# Patient Record
Sex: Male | Born: 1945 | Race: White | Hispanic: No | Marital: Single | Smoking: Current every day smoker
Health system: Southern US, Community
[De-identification: ages and names within clinical notes are randomized; demographics above are authoritative.]

## PROBLEM LIST (undated history)

## (undated) DIAGNOSIS — K222 Esophageal obstruction: Secondary | ICD-10-CM

## (undated) DIAGNOSIS — I639 Cerebral infarction, unspecified: Secondary | ICD-10-CM

## (undated) DIAGNOSIS — F4310 Post-traumatic stress disorder, unspecified: Secondary | ICD-10-CM

## (undated) DIAGNOSIS — I10 Essential (primary) hypertension: Secondary | ICD-10-CM

## (undated) HISTORY — PX: ORIF FEMUR FRACTURE- LISS PLATE: SHX2120

## (undated) HISTORY — PX: MELANOMA EXCISION: SHX5266

---

## 2010-10-12 ENCOUNTER — Inpatient Hospital Stay (HOSPITAL_COMMUNITY)
Admission: EM | Admit: 2010-10-12 | Discharge: 2010-10-17 | Payer: Self-pay | Source: Home / Self Care | Attending: Orthopaedic Surgery | Admitting: Orthopaedic Surgery

## 2010-10-31 ENCOUNTER — Inpatient Hospital Stay (HOSPITAL_COMMUNITY)
Admission: AD | Admit: 2010-10-31 | Discharge: 2010-11-02 | Payer: Self-pay | Source: Home / Self Care | Attending: Orthopaedic Surgery | Admitting: Orthopaedic Surgery

## 2010-11-05 LAB — BASIC METABOLIC PANEL
BUN: 14 mg/dL (ref 6–23)
BUN: 9 mg/dL (ref 6–23)
CO2: 29 mEq/L (ref 19–32)
CO2: 28 mEq/L (ref 19–32)
Calcium: 8.7 mg/dL (ref 8.4–10.5)
Calcium: 8.2 mg/dL — ABNORMAL LOW (ref 8.4–10.5)
Chloride: 83 mEq/L — ABNORMAL LOW (ref 96–112)
Chloride: 87 mEq/L — ABNORMAL LOW (ref 96–112)
Creatinine, Ser: 0.66 mg/dL (ref 0.4–1.5)
Creatinine, Ser: 0.52 mg/dL (ref 0.4–1.5)
GFR calc Af Amer: >60 mL/min (ref >60)
GFR calc Af Amer: >60 mL/min (ref >60)
GFR calc non Af Amer: >60 mL/min (ref >60)
GFR calc non Af Amer: >60 mL/min (ref >60)
Glucose, Bld: 113 mg/dL — ABNORMAL HIGH (ref 70–99)
Glucose, Bld: 125 mg/dL — ABNORMAL HIGH (ref 70–99)
Potassium: 3.5 mEq/L (ref 3.5–5.1)
Potassium: 3.6 mEq/L (ref 3.5–5.1)
Sodium: 122 mEq/L — ABNORMAL LOW (ref 135–145)
Sodium: 124 mEq/L — ABNORMAL LOW (ref 135–145)

## 2010-11-05 LAB — CBC
HCT: 34.2 % — ABNORMAL LOW (ref 39.0–52.0)
HCT: 31.8 % — ABNORMAL LOW (ref 39.0–52.0)
Hemoglobin: 12.5 g/dL — ABNORMAL LOW (ref 13.0–17.0)
Hemoglobin: 11.3 g/dL — ABNORMAL LOW (ref 13.0–17.0)
MCH: 31.4 pg (ref 26.0–34.0)
MCH: 30.5 pg (ref 26.0–34.0)
MCHC: 36.5 g/dL — ABNORMAL HIGH (ref 30.0–36.0)
MCHC: 35.5 g/dL (ref 30.0–36.0)
MCV: 85.9 fL (ref 78.0–100.0)
MCV: 85.7 fL (ref 78.0–100.0)
Platelets: 308 10*3/uL (ref 150–400)
Platelets: 283 10*3/uL (ref 150–400)
RBC: 3.98 MIL/uL — ABNORMAL LOW (ref 4.22–5.81)
RBC: 3.71 MIL/uL — ABNORMAL LOW (ref 4.22–5.81)
RDW: 11.8 % (ref 11.5–15.5)
RDW: 11.8 % (ref 11.5–15.5)
WBC: 14.3 10*3/uL — ABNORMAL HIGH (ref 4.0–10.5)
WBC: 14.3 10*3/uL — ABNORMAL HIGH (ref 4.0–10.5)

## 2010-11-05 LAB — SURGICAL PCR SCREEN
MRSA, PCR: NEGATIVE
Staphylococcus aureus: NEGATIVE

## 2010-11-05 LAB — PROTIME-INR
INR: 1.01 (ref 0.00–1.49)
Prothrombin Time: 13.5 seconds (ref 11.6–15.2)

## 2010-12-31 LAB — DIFFERENTIAL
Basophils Absolute: 0 10*3/uL (ref 0.0–0.1)
Basophils Relative: 0 % (ref 0–1)
Eosinophils Absolute: 0 10*3/uL (ref 0.0–0.7)
Eosinophils Relative: 0 % (ref 0–5)
Lymphocytes Relative: 5 % — ABNORMAL LOW (ref 12–46)
Lymphs Abs: 0.7 10*3/uL (ref 0.7–4.0)
Monocytes Absolute: 0.8 10*3/uL (ref 0.1–1.0)
Monocytes Relative: 5 % (ref 3–12)
Neutro Abs: 13.1 10*3/uL — ABNORMAL HIGH (ref 1.7–7.7)
Neutrophils Relative %: 90 % — ABNORMAL HIGH (ref 43–77)

## 2010-12-31 LAB — CBC
HCT: 39.1 % (ref 39.0–52.0)
Hemoglobin: 13.6 g/dL (ref 13.0–17.0)
MCH: 30.9 pg (ref 26.0–34.0)
MCHC: 34.8 g/dL (ref 30.0–36.0)
MCV: 88.9 fL (ref 78.0–100.0)
Platelets: 158 10*3/uL (ref 150–400)
RBC: 4.4 MIL/uL (ref 4.22–5.81)
RDW: 12.8 % (ref 11.5–15.5)
WBC: 14.6 10*3/uL — ABNORMAL HIGH (ref 4.0–10.5)

## 2010-12-31 LAB — URINALYSIS, ROUTINE W REFLEX MICROSCOPIC
Bilirubin Urine: NEGATIVE
Glucose, UA: NEGATIVE mg/dL
Hgb urine dipstick: NEGATIVE
Ketones, ur: NEGATIVE mg/dL
Nitrite: NEGATIVE
Protein, ur: NEGATIVE mg/dL
Specific Gravity, Urine: 1.014 (ref 1.005–1.030)
Urobilinogen, UA: 1 mg/dL (ref 0.0–1.0)
pH: 8 (ref 5.0–8.0)

## 2010-12-31 LAB — URINE CULTURE
Colony Count: 40000
Culture  Setup Time: 201112232022

## 2010-12-31 LAB — BASIC METABOLIC PANEL
BUN: 12 mg/dL (ref 6–23)
CO2: 25 mEq/L (ref 19–32)
Calcium: 8.9 mg/dL (ref 8.4–10.5)
Chloride: 98 mEq/L (ref 96–112)
Creatinine, Ser: 0.76 mg/dL (ref 0.4–1.5)
GFR calc Af Amer: >60 mL/min (ref >60)
GFR calc non Af Amer: >60 mL/min (ref >60)
Glucose, Bld: 117 mg/dL — ABNORMAL HIGH (ref 70–99)
Potassium: 3.8 mEq/L (ref 3.5–5.1)
Sodium: 133 mEq/L — ABNORMAL LOW (ref 135–145)

## 2011-11-19 IMAGING — CR DG FEMUR 2V*L*
5 series · 5 of 5 positions shown · non-contrast
Comparison: None.

CLINICAL DATA: Left femur pain secondary to a fall today.

LEFT FEMUR - 2 VIEW

[t femur with hip  ap left]
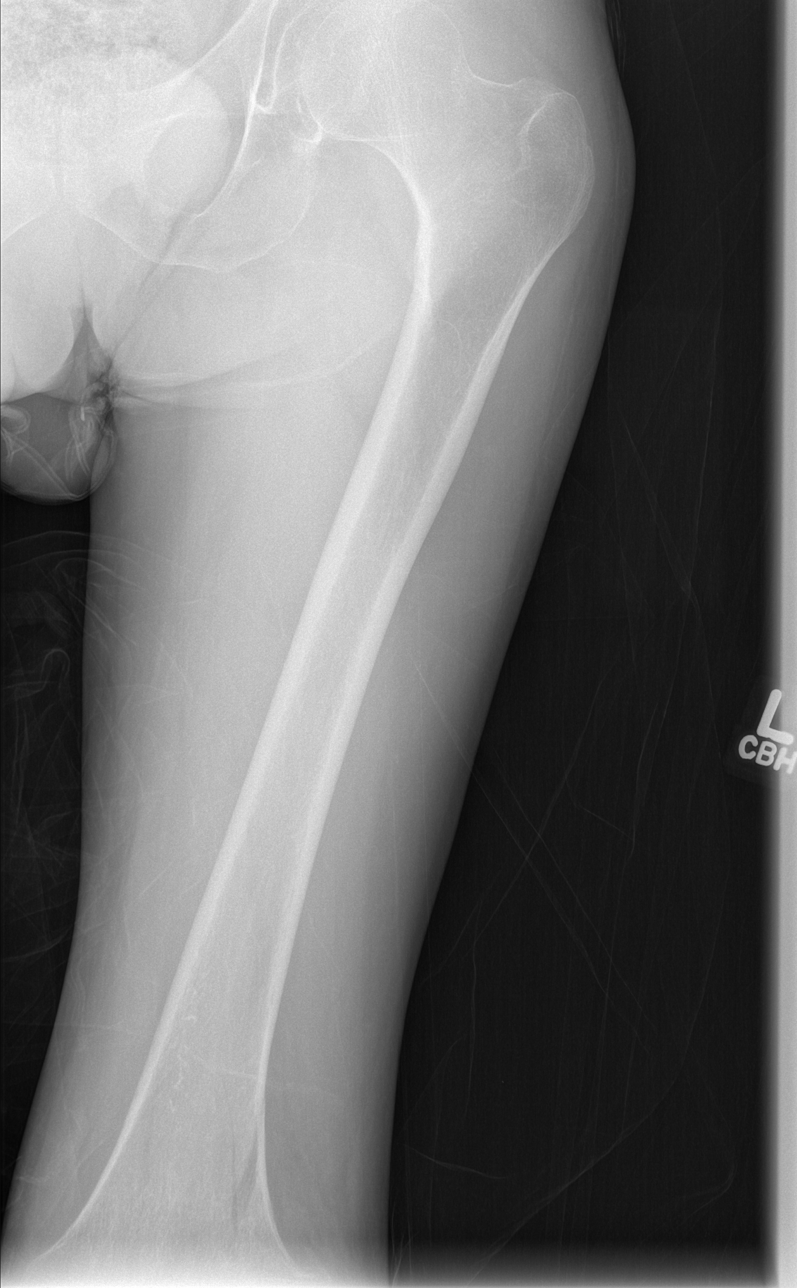

[t femur with knee ap left (1 of 2)]
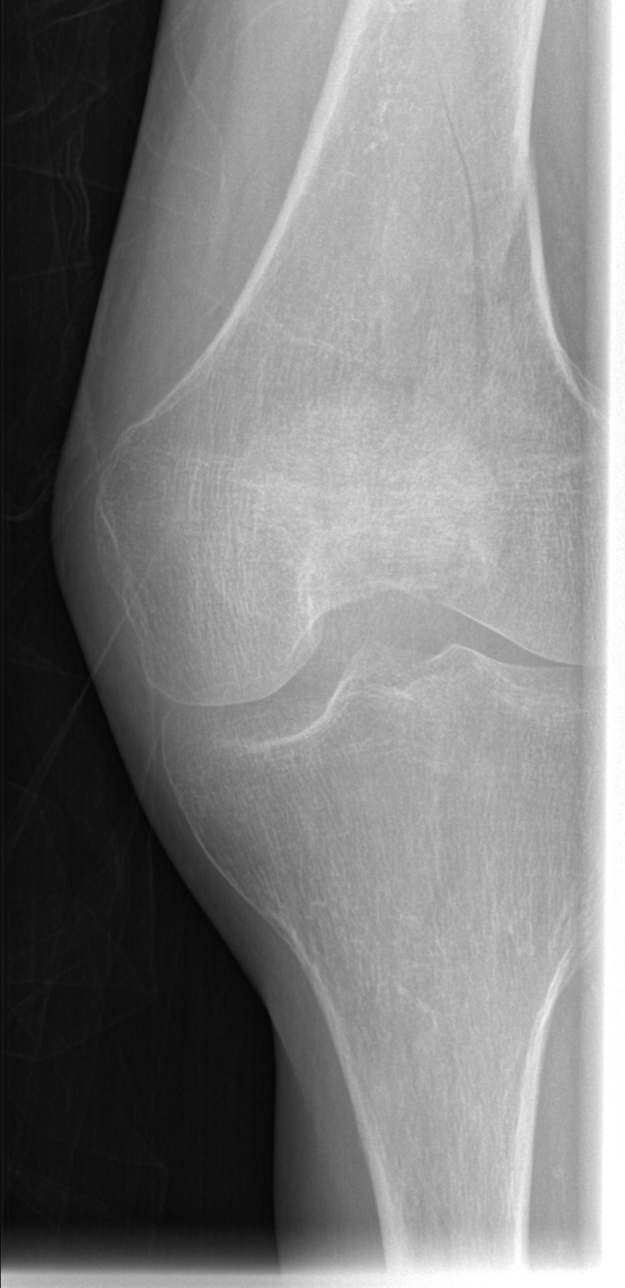

[t femur with knee ap left (2 of 2)]
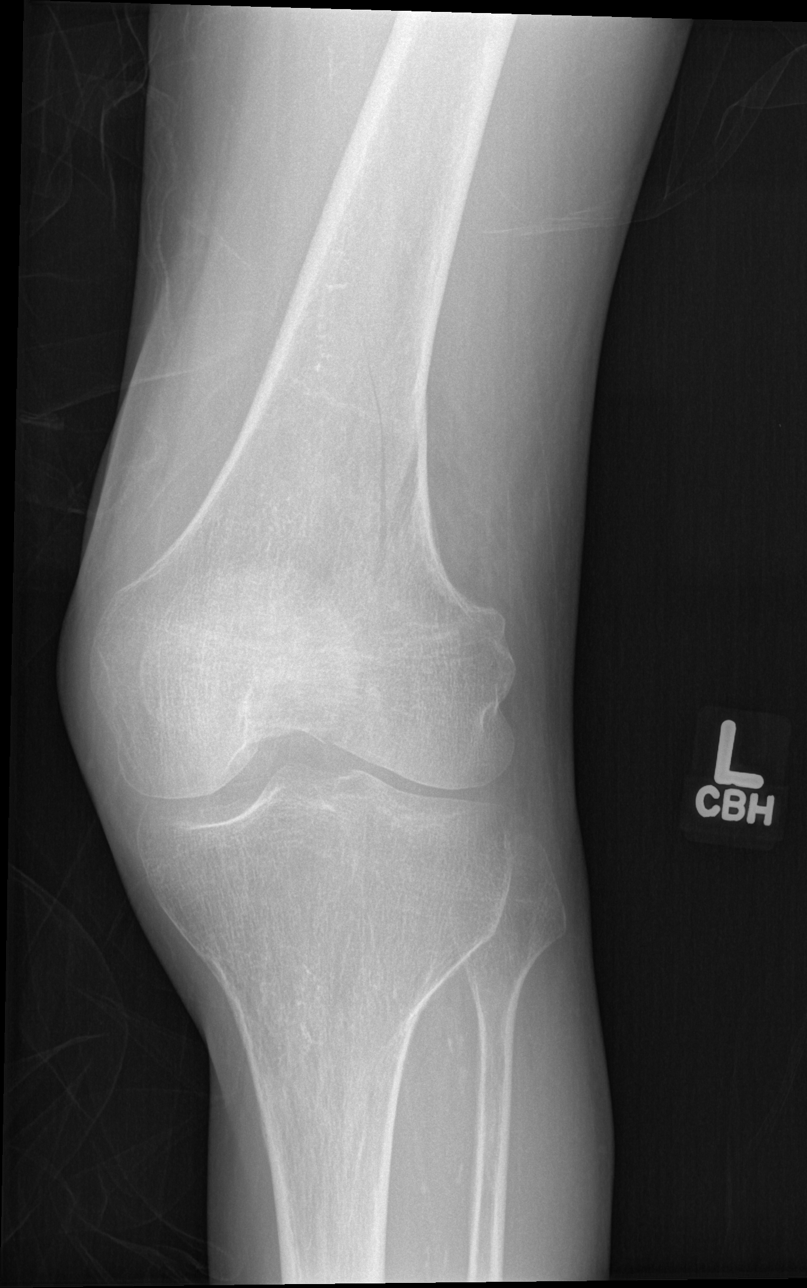

[t femur with hip lat left]
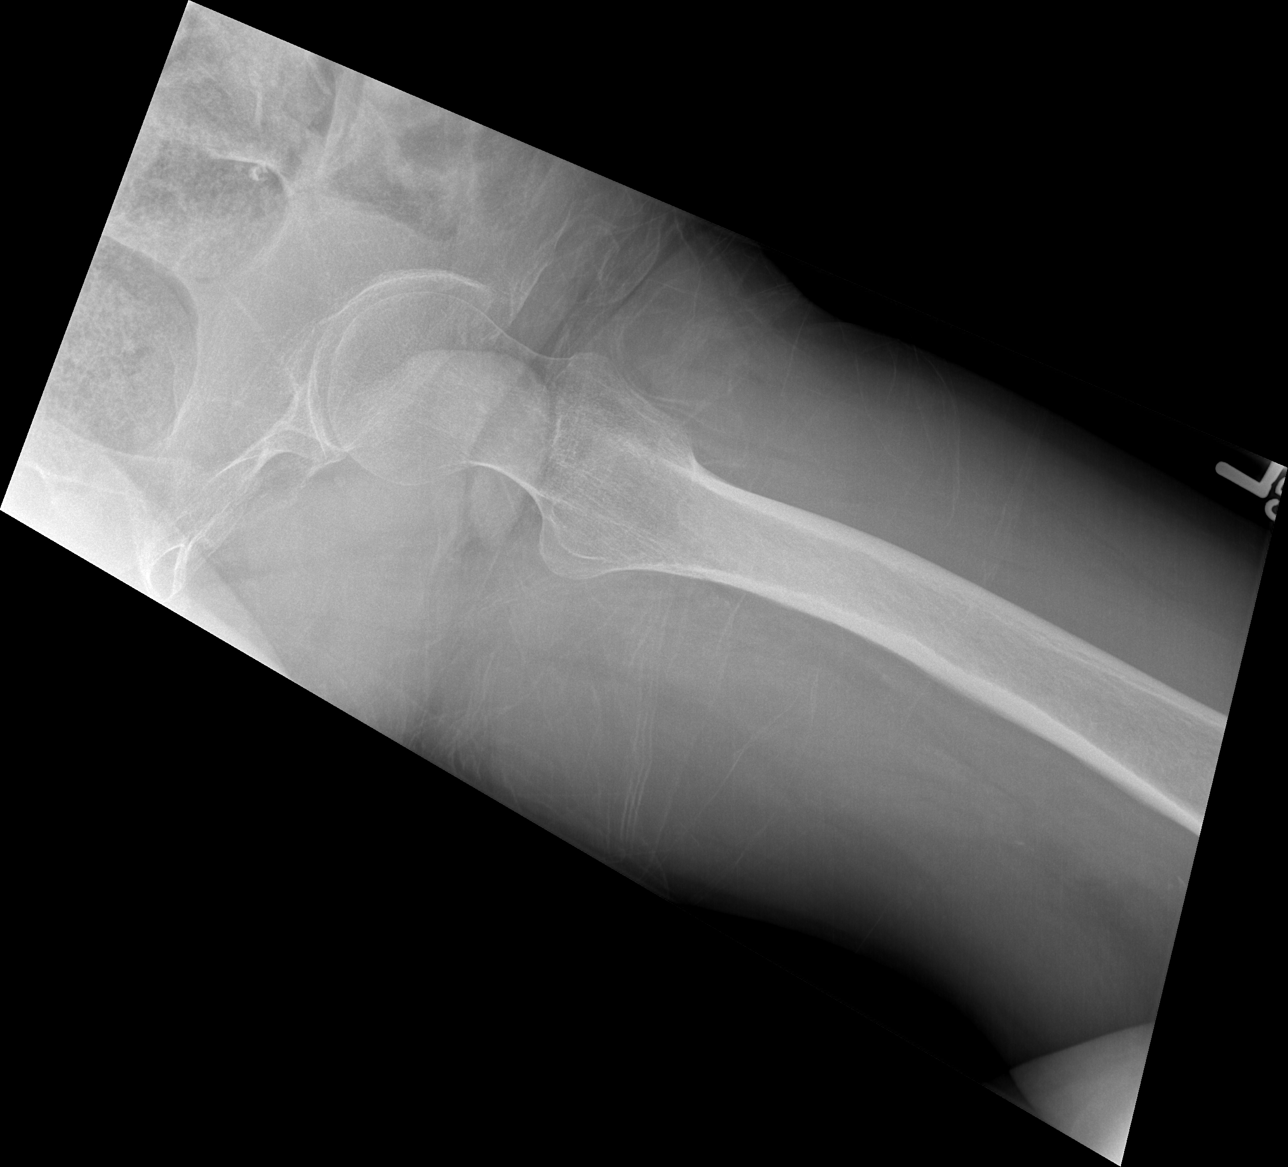

[t femur with knee lat left]
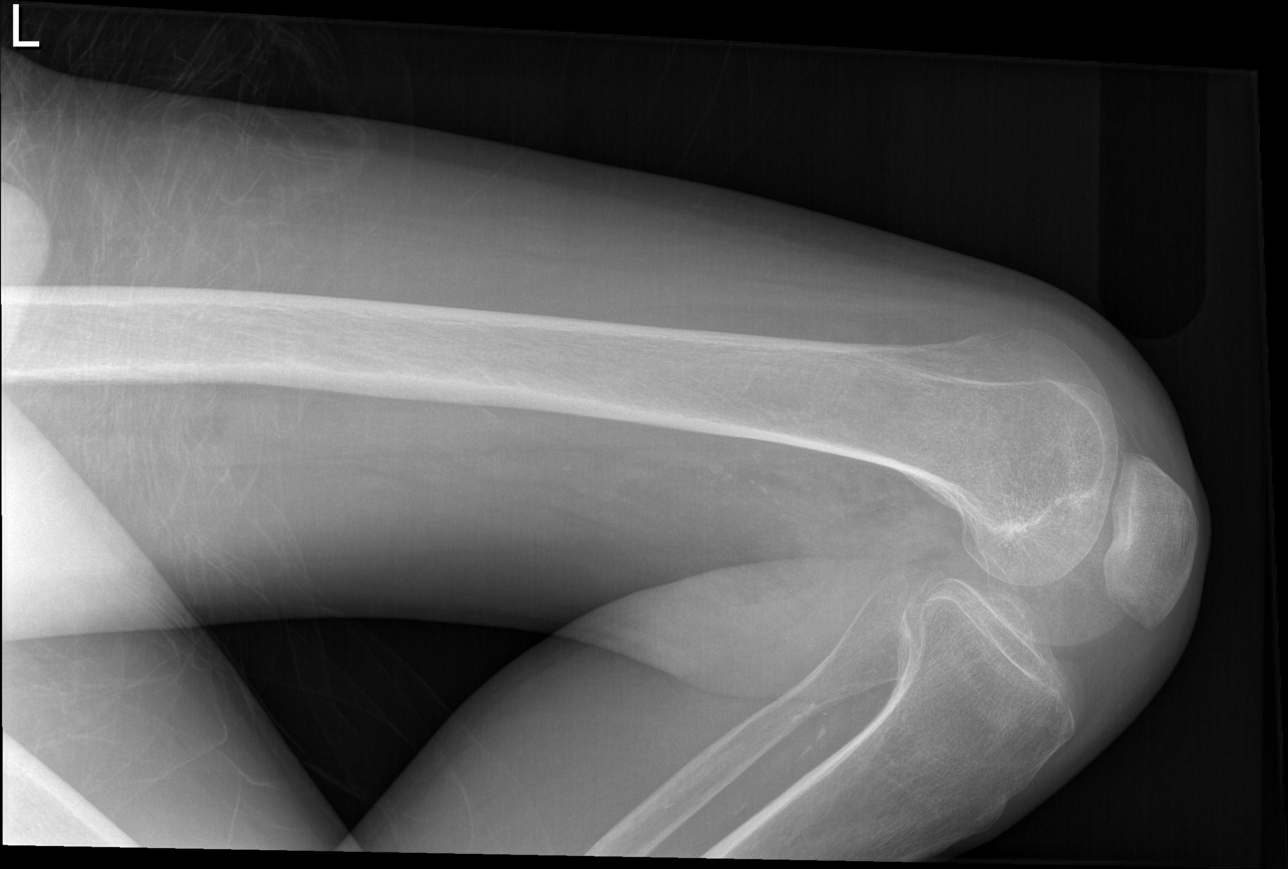

[5 of 5 positions shown; findings below may reference images not displayed]

FINDINGS: There is a nondisplaced spiral fracture of the distal
shaft of the left femur.  The fracture does extend into the
intercondylar notch.

There is severe osteopenia.  There are no significant degenerative
changes of the hip joint.
IMPRESSION: Spiral fracture of the distal left femoral shaft extending into the
intercondylar notch.  Osteopenia.

## 2011-12-08 IMAGING — RF DG FEMUR 2V*L*
1 series · 4 of 4 positions shown · non-contrast
Comparison: 10/12/2010.

CLINICAL DATA: 64-year-old male undergoing ORIF.

LEFT FEMUR - 2 VIEW

[Series 1: run · 4 of 4 slices shown]
[im 1/4]
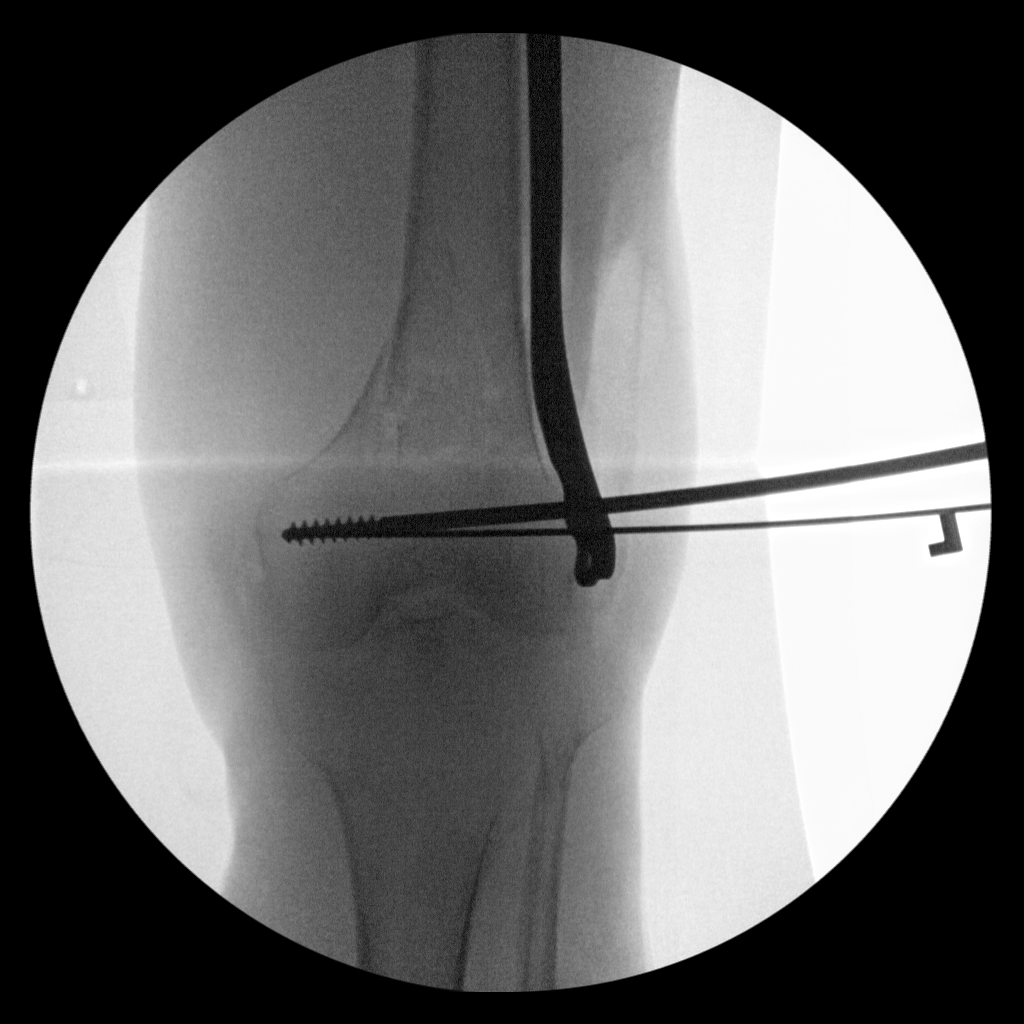
[im 2/4]
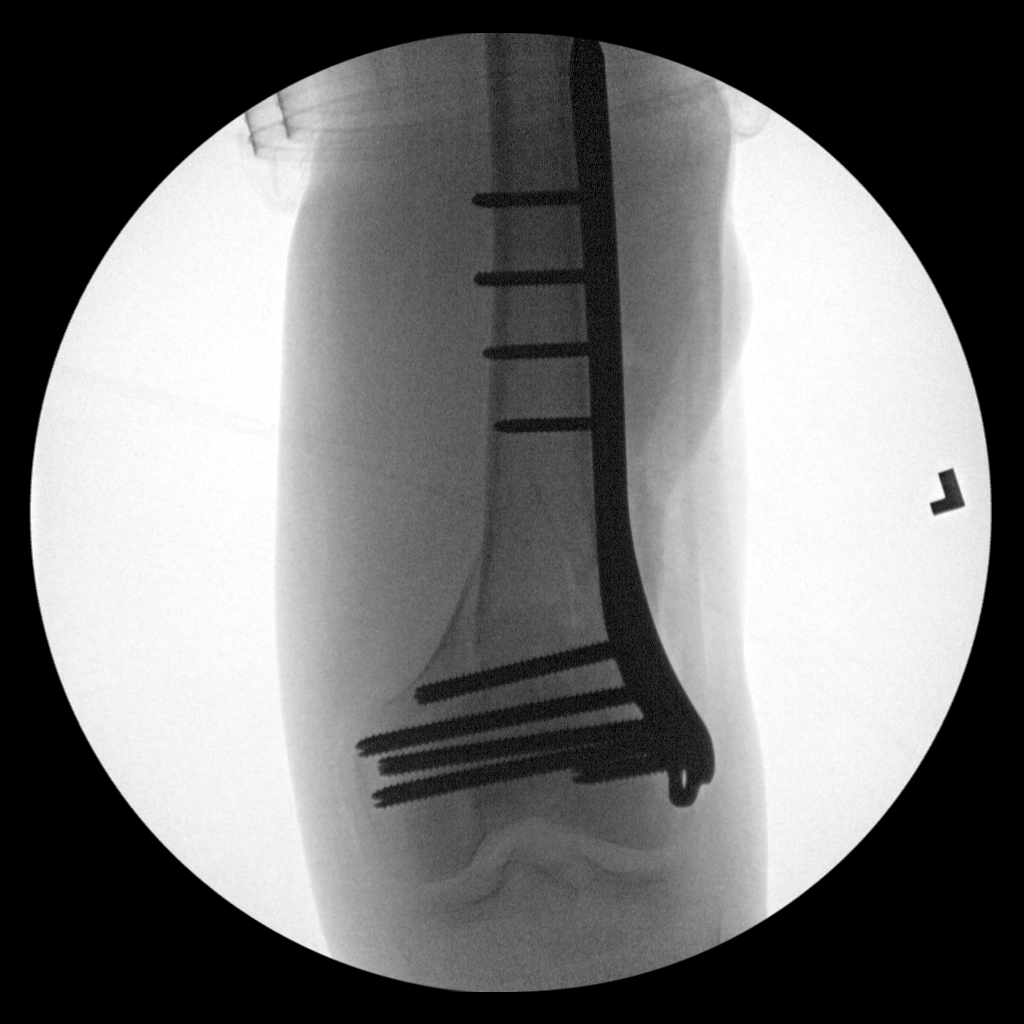
[im 3/4]
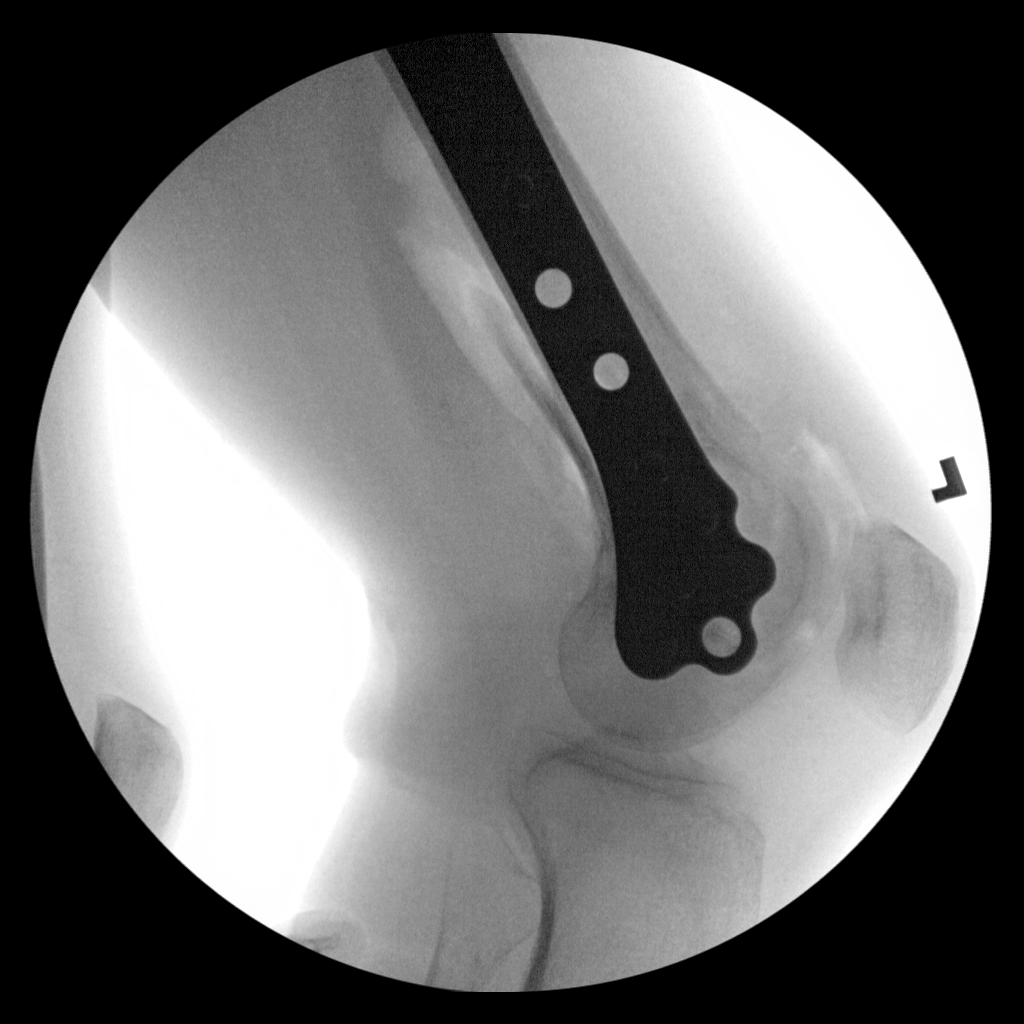
[im 4/4]
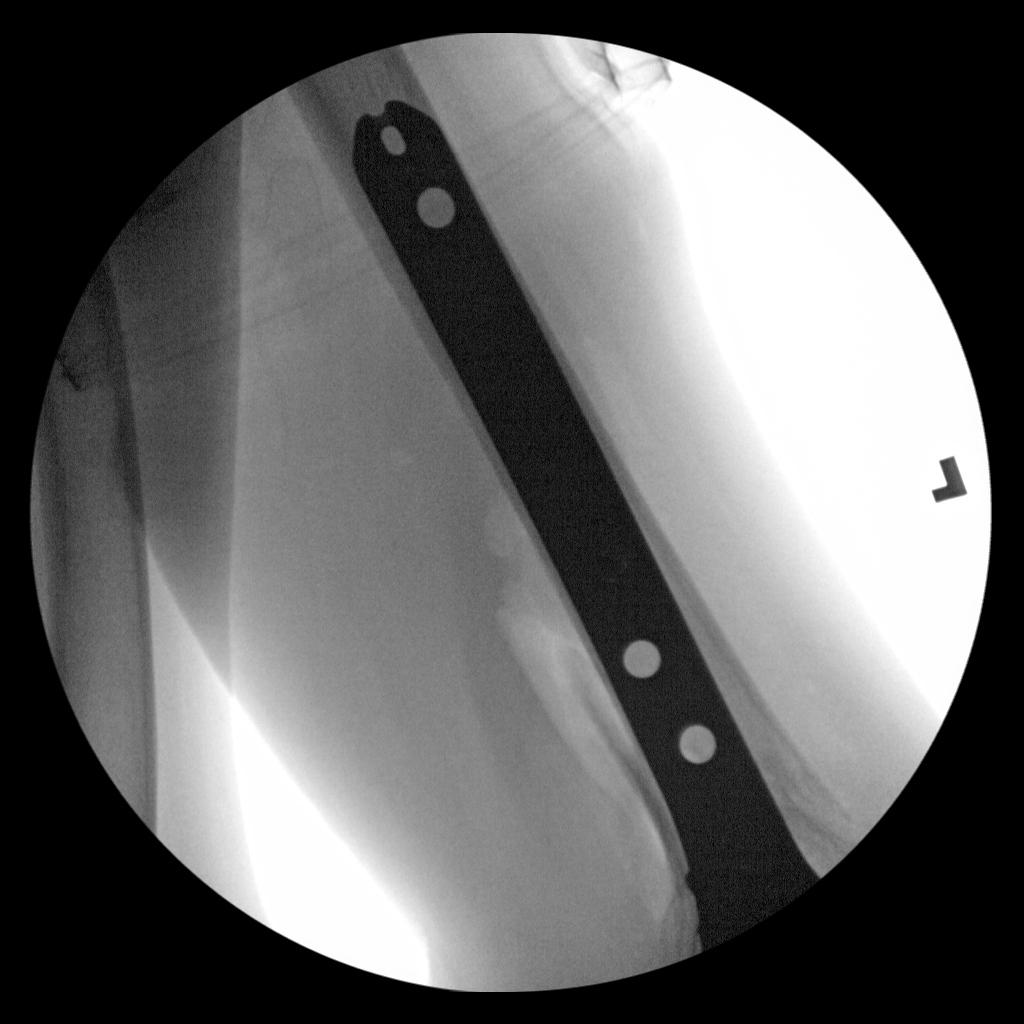

[4 of 4 positions shown; findings below may reference images not displayed]

FINDINGS: 4 intraoperative fluoroscopic spot views of the distal
left femur.  Lateral plate and cortical screw fixation of the
distal femur demonstrated traversing the spiral fracture previously
demonstrated.  Hardware  appears intact.
IMPRESSION: No adverse hardware features status post ORIF distal left femur
spiral fracture.

## 2012-02-16 ENCOUNTER — Encounter (HOSPITAL_COMMUNITY): Payer: Self-pay

## 2012-02-16 ENCOUNTER — Emergency Department (HOSPITAL_COMMUNITY): Payer: Medicare Other

## 2012-02-16 ENCOUNTER — Inpatient Hospital Stay (HOSPITAL_COMMUNITY)
Admission: EM | Admit: 2012-02-16 | Discharge: 2012-02-20 | DRG: 689 | Disposition: A | Discharge status: Skilled Nursing Facility | Payer: Medicare Other | Attending: Internal Medicine | Admitting: Internal Medicine

## 2012-02-16 DIAGNOSIS — F4310 Post-traumatic stress disorder, unspecified: Secondary | ICD-10-CM | POA: Diagnosis present

## 2012-02-16 DIAGNOSIS — R5383 Other fatigue: Secondary | ICD-10-CM | POA: Diagnosis present

## 2012-02-16 DIAGNOSIS — R5381 Other malaise: Secondary | ICD-10-CM | POA: Diagnosis present

## 2012-02-16 DIAGNOSIS — B9689 Other specified bacterial agents as the cause of diseases classified elsewhere: Secondary | ICD-10-CM | POA: Diagnosis present

## 2012-02-16 DIAGNOSIS — R531 Weakness: Secondary | ICD-10-CM | POA: Diagnosis present

## 2012-02-16 DIAGNOSIS — Z8673 Personal history of transient ischemic attack (TIA), and cerebral infarction without residual deficits: Secondary | ICD-10-CM

## 2012-02-16 DIAGNOSIS — E43 Unspecified severe protein-calorie malnutrition: Secondary | ICD-10-CM | POA: Diagnosis present

## 2012-02-16 DIAGNOSIS — F17200 Nicotine dependence, unspecified, uncomplicated: Secondary | ICD-10-CM | POA: Diagnosis present

## 2012-02-16 DIAGNOSIS — N390 Urinary tract infection, site not specified: Secondary | ICD-10-CM | POA: Diagnosis present

## 2012-02-16 DIAGNOSIS — Z720 Tobacco use: Secondary | ICD-10-CM

## 2012-02-16 DIAGNOSIS — F3289 Other specified depressive episodes: Secondary | ICD-10-CM | POA: Diagnosis present

## 2012-02-16 DIAGNOSIS — I10 Essential (primary) hypertension: Secondary | ICD-10-CM | POA: Diagnosis present

## 2012-02-16 DIAGNOSIS — F209 Schizophrenia, unspecified: Secondary | ICD-10-CM | POA: Diagnosis present

## 2012-02-16 DIAGNOSIS — R05 Cough: Secondary | ICD-10-CM | POA: Diagnosis present

## 2012-02-16 DIAGNOSIS — E871 Hypo-osmolality and hyponatremia: Secondary | ICD-10-CM | POA: Diagnosis present

## 2012-02-16 DIAGNOSIS — G9349 Other encephalopathy: Secondary | ICD-10-CM | POA: Diagnosis present

## 2012-02-16 DIAGNOSIS — F329 Major depressive disorder, single episode, unspecified: Secondary | ICD-10-CM

## 2012-02-16 DIAGNOSIS — R627 Adult failure to thrive: Secondary | ICD-10-CM | POA: Diagnosis present

## 2012-02-16 DIAGNOSIS — E876 Hypokalemia: Secondary | ICD-10-CM | POA: Diagnosis present

## 2012-02-16 HISTORY — DX: Cerebral infarction, unspecified: I63.9

## 2012-02-16 HISTORY — DX: Essential (primary) hypertension: I10

## 2012-02-16 HISTORY — DX: Esophageal obstruction: K22.2

## 2012-02-16 HISTORY — DX: Post-traumatic stress disorder, unspecified: F43.10

## 2012-02-16 LAB — HEPATIC FUNCTION PANEL
Bilirubin, Direct: <0.1 mg/dL (ref 0.0–0.3)
Total Protein: 6 g/dL (ref 6.0–8.3)

## 2012-02-16 LAB — BASIC METABOLIC PANEL
BUN: 8 mg/dL (ref 6–23)
CO2: 31 mEq/L (ref 19–32)
Calcium: 9.2 mg/dL (ref 8.4–10.5)
Chloride: 78 mEq/L — ABNORMAL LOW (ref 96–112)
Creatinine, Ser: 0.42 mg/dL — ABNORMAL LOW (ref 0.50–1.35)
GFR calc Af Amer: >90 mL/min (ref >90)
GFR calc non Af Amer: >90 mL/min (ref >90)
Glucose, Bld: 121 mg/dL — ABNORMAL HIGH (ref 70–99)
Potassium: 2.8 mEq/L — ABNORMAL LOW (ref 3.5–5.1)
Sodium: 121 mEq/L — ABNORMAL LOW (ref 135–145)

## 2012-02-16 LAB — CBC
HCT: 38.2 % — ABNORMAL LOW (ref 39.0–52.0)
Hemoglobin: 14.1 g/dL (ref 13.0–17.0)
MCH: 30.5 pg (ref 26.0–34.0)
MCHC: 36.9 g/dL — ABNORMAL HIGH (ref 30.0–36.0)
MCV: 82.7 fL (ref 78.0–100.0)
Platelets: 259 10*3/uL (ref 150–400)
RBC: 4.62 MIL/uL (ref 4.22–5.81)
RDW: 12.1 % (ref 11.5–15.5)
WBC: 11.2 10*3/uL — ABNORMAL HIGH (ref 4.0–10.5)

## 2012-02-16 LAB — URINALYSIS, ROUTINE W REFLEX MICROSCOPIC
Bilirubin Urine: NEGATIVE
Glucose, UA: NEGATIVE mg/dL
Hgb urine dipstick: NEGATIVE
Ketones, ur: >80 mg/dL — AB
Leukocytes, UA: NEGATIVE
Nitrite: POSITIVE — AB
Protein, ur: NEGATIVE mg/dL
Specific Gravity, Urine: 1.015 (ref 1.005–1.030)
Urobilinogen, UA: 0.2 mg/dL (ref 0.0–1.0)
pH: 7.5 (ref 5.0–8.0)

## 2012-02-16 LAB — DIFFERENTIAL
Basophils Absolute: 0 10*3/uL (ref 0.0–0.1)
Basophils Relative: 0 % (ref 0–1)
Eosinophils Absolute: 0 10*3/uL (ref 0.0–0.7)
Eosinophils Relative: 0 % (ref 0–5)
Lymphocytes Relative: 8 % — ABNORMAL LOW (ref 12–46)
Lymphs Abs: 0.9 10*3/uL (ref 0.7–4.0)
Monocytes Absolute: 0.4 10*3/uL (ref 0.1–1.0)
Monocytes Relative: 4 % (ref 3–12)
Neutro Abs: 9.9 10*3/uL — ABNORMAL HIGH (ref 1.7–7.7)
Neutrophils Relative %: 88 % — ABNORMAL HIGH (ref 43–77)

## 2012-02-16 LAB — PHOSPHORUS: Phosphorus: 2.5 mg/dL (ref 2.3–4.6)

## 2012-02-16 LAB — MAGNESIUM: Magnesium: 1.8 mg/dL (ref 1.5–2.5)

## 2012-02-16 MED ORDER — RISPERIDONE 1 MG PO TABS
1.0000 mg | ORAL_TABLET | Freq: Every day | ORAL | Status: DC
  Administered 2012-02-17 – 2012-02-20 (×4): 1 mg via ORAL
  Filled 2012-02-16 (×4): qty 1

## 2012-02-16 MED ORDER — ENSURE COMPLETE PO LIQD
237.0000 mL | Freq: Three times a day (TID) | ORAL | Status: DC
  Administered 2012-02-17 – 2012-02-20 (×11): 237 mL via ORAL

## 2012-02-16 MED ORDER — ACETAMINOPHEN 650 MG RE SUPP: 650.0000 mg | Freq: Four times a day (QID) | RECTAL | Status: DC | PRN

## 2012-02-16 MED ORDER — SODIUM CHLORIDE 0.9 % IV SOLN: Freq: Once | INTRAVENOUS | Status: DC

## 2012-02-16 MED ORDER — MORPHINE SULFATE 2 MG/ML IJ SOLN
1.0000 mg | INTRAMUSCULAR | Status: DC | PRN
  Administered 2012-02-17 – 2012-02-19 (×2): 1 mg via INTRAVENOUS
  Filled 2012-02-16 (×2): qty 1

## 2012-02-16 MED ORDER — SODIUM CHLORIDE 0.9 % IJ SOLN
3.0000 mL | Freq: Two times a day (BID) | INTRAMUSCULAR | Status: DC
  Administered 2012-02-16 – 2012-02-19 (×2): 3 mL via INTRAVENOUS

## 2012-02-16 MED ORDER — NICOTINE 14 MG/24HR TD PT24
14.0000 mg | MEDICATED_PATCH | Freq: Every day | TRANSDERMAL | Status: DC
Start: 2012-02-16 — End: 2012-02-20
  Administered 2012-02-16 – 2012-02-20 (×5): 14 mg via TRANSDERMAL
  Filled 2012-02-16 (×6): qty 1

## 2012-02-16 MED ORDER — VITAMIN E 180 MG (400 UNIT) PO CAPS
400.0000 [IU] | ORAL_CAPSULE | Freq: Every morning | ORAL | Status: DC
  Administered 2012-02-17 – 2012-02-20 (×4): 400 [IU] via ORAL
  Filled 2012-02-16 (×4): qty 1

## 2012-02-16 MED ORDER — ENOXAPARIN SODIUM 40 MG/0.4ML ~~LOC~~ SOLN
40.0000 mg | SUBCUTANEOUS | Status: DC
  Administered 2012-02-16 – 2012-02-18 (×3): 40 mg via SUBCUTANEOUS
  Filled 2012-02-16 (×4): qty 0.4

## 2012-02-16 MED ORDER — VITAMIN D3 25 MCG (1000 UT) PO CAPS: 1.0000 | ORAL_CAPSULE | Freq: Every morning | ORAL | Status: DC

## 2012-02-16 MED ORDER — ACETAMINOPHEN 325 MG PO TABS
650.0000 mg | ORAL_TABLET | Freq: Four times a day (QID) | ORAL | Status: DC | PRN
  Administered 2012-02-16 – 2012-02-19 (×4): 650 mg via ORAL
  Filled 2012-02-16 (×4): qty 2

## 2012-02-16 MED ORDER — VITAMIN D3 25 MCG (1000 UNIT) PO TABS
1000.0000 [IU] | ORAL_TABLET | Freq: Every day | ORAL | Status: DC
  Administered 2012-02-17 – 2012-02-20 (×4): 1000 [IU] via ORAL
  Filled 2012-02-16 (×4): qty 1

## 2012-02-16 MED ORDER — ASPIRIN EC 81 MG PO TBEC
81.0000 mg | DELAYED_RELEASE_TABLET | Freq: Every day | ORAL | Status: DC
  Administered 2012-02-17 – 2012-02-20 (×4): 81 mg via ORAL
  Filled 2012-02-16 (×4): qty 1

## 2012-02-16 MED ORDER — LEVOFLOXACIN IN D5W 500 MG/100ML IV SOLN
500.0000 mg | INTRAVENOUS | Status: DC
  Administered 2012-02-16 – 2012-02-17 (×2): 500 mg via INTRAVENOUS
  Filled 2012-02-16 (×3): qty 100

## 2012-02-16 MED ORDER — ONDANSETRON HCL 4 MG PO TABS: 4.0000 mg | ORAL_TABLET | Freq: Four times a day (QID) | ORAL | Status: DC | PRN

## 2012-02-16 MED ORDER — AMLODIPINE BESYLATE 5 MG PO TABS
5.0000 mg | ORAL_TABLET | Freq: Every morning | ORAL | Status: DC
  Administered 2012-02-17 – 2012-02-18 (×2): 5 mg via ORAL
  Filled 2012-02-16 (×2): qty 1

## 2012-02-16 MED ORDER — MIRTAZAPINE 15 MG PO TABS
15.0000 mg | ORAL_TABLET | Freq: Every day | ORAL | Status: DC
  Administered 2012-02-16 – 2012-02-19 (×4): 15 mg via ORAL
  Filled 2012-02-16 (×5): qty 1

## 2012-02-16 MED ORDER — POTASSIUM CHLORIDE 10 MEQ/100ML IV SOLN
10.0000 meq | Freq: Once | INTRAVENOUS | Status: AC
  Administered 2012-02-16: 10 meq via INTRAVENOUS
  Filled 2012-02-16: qty 100

## 2012-02-16 MED ORDER — ONDANSETRON HCL 4 MG/2ML IJ SOLN: 4.0000 mg | Freq: Three times a day (TID) | INTRAMUSCULAR | Status: DC | PRN

## 2012-02-16 MED ORDER — POTASSIUM CHLORIDE IN NACL 40-0.9 MEQ/L-% IV SOLN
INTRAVENOUS | Status: DC
  Administered 2012-02-16 – 2012-02-17 (×3): via INTRAVENOUS
  Administered 2012-02-18: 75 mL/h via INTRAVENOUS
  Filled 2012-02-16 (×6): qty 1000

## 2012-02-16 MED ORDER — ADULT MULTIVITAMIN W/MINERALS CH
1.0000 | ORAL_TABLET | Freq: Every day | ORAL | Status: DC
  Administered 2012-02-17 – 2012-02-20 (×4): 1 via ORAL
  Filled 2012-02-16 (×5): qty 1

## 2012-02-16 MED ORDER — SODIUM CHLORIDE 0.9 % IV SOLN
Freq: Once | INTRAVENOUS | Status: AC
  Administered 2012-02-16: 16:00:00 via INTRAVENOUS

## 2012-02-16 MED ORDER — POTASSIUM CHLORIDE CRYS ER 20 MEQ PO TBCR
40.0000 meq | EXTENDED_RELEASE_TABLET | ORAL | Status: AC
  Administered 2012-02-16 – 2012-02-17 (×3): 40 meq via ORAL
  Filled 2012-02-16 (×4): qty 2

## 2012-02-16 MED ORDER — BENZTROPINE MESYLATE 0.5 MG PO TABS
0.5000 mg | ORAL_TABLET | Freq: Two times a day (BID) | ORAL | Status: DC
  Administered 2012-02-16 – 2012-02-20 (×8): 0.5 mg via ORAL
  Filled 2012-02-16 (×9): qty 1

## 2012-02-16 MED ORDER — ONDANSETRON HCL 4 MG/2ML IJ SOLN: 4.0000 mg | Freq: Four times a day (QID) | INTRAMUSCULAR | Status: DC | PRN

## 2012-02-16 MED ORDER — SODIUM CHLORIDE 0.9 % IV BOLUS (SEPSIS): 1000.0000 mL | Freq: Once | INTRAVENOUS | Status: DC

## 2012-02-16 NOTE — ED Notes (Signed)
Pt describes legs drawing up in cramps 2-3 daily, worse at night.

## 2012-02-16 NOTE — ED Provider Notes (Signed)
History     CSN: [id number]  Arrival date & time 02/16/12  1059   First MD Initiated Contact with Patient 02/16/12 1116      No chief complaint on file.   (Consider location/radiation/quality/duration/timing/severity/associated sxs/prior treatment) HPI History from patient and family at bedside. Patient is a poor historian secondary to dementia. 66 year old male with past medical history of hypertension and schizophrenia who presents with complaint of back pain. He states this has been ongoing for some time, but has been worse over about the past week or so. He has had accompanying left leg pain, which is described as a lightning sensation going down the outside of his leg associated with cramps. He denies any numbness or weakness. He is able to ambulate. He did see his PCP for the leg pain last Monday, and this was felt to be muscle cramps. Family states that she did draw some blood work, that they have not heard the results of this.  Per the family, the patient has had a decreased appetite for the past several days and has been constipated as well. He has only been drinking small amounts and has not been eating. They have not noticed any notable change in his mental status over the past several days specifically, but states they have noticed a difference for the past several weeks; he has been sitting on the couch more than usual and has not been participating in chores around the house as he normally does. He has not had a fever or chills. He has not had any URI symptoms, nausea or vomiting. They have noted a strong odor to his urine.  Past Medical History  Diagnosis Date  . Hypertension     No past surgical history on file.  No family history on file.  History  Substance Use Topics  . Smoking status: Current Everyday Smoker  . Smokeless tobacco: Not on file  . Alcohol Use: No      Review of Systems  Constitutional: Positive for activity change and appetite change. Negative  for fever and chills.  HENT: Negative.   Respiratory: Positive for cough. Negative for shortness of breath.        Smoker's cough  Cardiovascular: Negative for chest pain.  Gastrointestinal: Positive for abdominal pain and constipation. Negative for nausea and vomiting.  Musculoskeletal: Positive for myalgias.  Skin: Negative for color change and rash.  Neurological: Negative for weakness and numbness.  All other systems reviewed and are negative.    Allergies  Review of patient's allergies indicates no known allergies.  Home Medications  No current outpatient prescriptions on file.  BP 167/83  Pulse 104  Temp(Src) 97.3 F (36.3 C) (Oral)  Resp 18  SpO2 97%  Physical Exam  Nursing note and vitals reviewed. Constitutional: He appears well-developed and well-nourished. No distress.  HENT:  Head: Normocephalic and atraumatic.  Mouth/Throat: Oropharynx is clear and moist.  Eyes: EOM are normal. Pupils are equal, round, and reactive to light.  Neck: Normal range of motion. Neck supple.  Cardiovascular: Normal rate, regular rhythm and normal heart sounds.  Exam reveals no gallop and no friction rub.   No murmur heard. Pulmonary/Chest: Effort normal and breath sounds normal. He exhibits no tenderness.  Abdominal: Soft. Bowel sounds are normal.       LLQ and suprapubic tenderness. Bladder distended. No rebound or guard.  Musculoskeletal: Normal range of motion.       Spine: No palpable stepoff, crepitus, or gross deformity appreciated. No appreciable spasm  of paravertebral muscles. No midline tenderness.   Neurological: He is alert.  Skin: Skin is warm and dry. He is not diaphoretic.  Psychiatric: He has a normal mood and affect.    ED Course  Procedures (including critical care time)  Labs Reviewed  CBC - Abnormal; Notable for the following:    WBC 11.2 (*)    HCT 38.2 (*)    MCHC 36.9 (*)    All other components within normal limits  DIFFERENTIAL - Abnormal; Notable  for the following:    Neutrophils Relative 88 (*)    Lymphocytes Relative 8 (*)    Neutro Abs 9.9 (*)    All other components within normal limits  URINALYSIS, ROUTINE W REFLEX MICROSCOPIC - Abnormal; Notable for the following:    APPearance CLOUDY (*)    Ketones, ur >80 (*)    Nitrite POSITIVE (*)    All other components within normal limits  BASIC METABOLIC PANEL - Abnormal; Notable for the following:    Sodium 121 (*)    Potassium 2.8 (*)    Chloride 78 (*)    Glucose, Bld 121 (*)    Creatinine, Ser 0.42 (*)    All other components within normal limits  URINE MICROSCOPIC-ADD ON - Abnormal; Notable for the following:    Bacteria, UA FEW (*)    All other components within normal limits  URINE CULTURE   Dg Abd Acute W/chest  02/16/2012  *RADIOLOGY REPORT*  Clinical Data: Acute abdominal pain and constipation  ACUTE ABDOMEN SERIES (ABDOMEN 2 VIEW & CHEST 1 VIEW)  Comparison: None.  Findings:  The heart size is normal.  There is no pleural effusion or pulmonary edema.  No airspace consolidation identified.  Chronic bronchitic changes are noted.  Air-filled loops of large and small bowel are noted bilaterally.  There is a moderate amount of desiccated stool within the proximal right colon.  IMPRESSION:  1.  Nonspecific bowel gas pattern without evidence for high-grade obstruction. 2.  Chronic bronchitic changes.  Original Report Authenticated By: Rosealee Albee, M.D.     1. Hypokalemia   2. Hyponatremia   3. Urinary tract infection       MDM  66 year old male who presents with complaints of left leg pain and back pain. Per his family, he has also had a gradual change in his mental status and reduced appetite for the past several days. His labs are significant for hyponatremia - previous labs indicated that he has been hyponatremic in the past. He does, however have hypokalemia as well, and nitrite positive urine without leukocytes. Given this, we'll plan to admit for electrolyte  repletion. I have discussed with the hospitalist who agrees to admit.  Patient case was discussed with attending ED physician.         Grant Fontana, [place] 02/16/12 1413

## 2012-02-16 NOTE — ED Notes (Signed)
[id number]<AV> Expected date:02/16/12<BR> Expected time:10:53 AM<BR> Means of arrival:Ambulance<BR> Comments:<BR> Back Pain

## 2012-02-16 NOTE — ED Notes (Addendum)
Pt from home. C/o leg weakness x 1 year, worsen this week, and back pain x 1 week ago as well. Denied any injury.

## 2012-02-16 NOTE — H&P (Signed)
PCP:   Dr. Andrey Campanile ([facility])   Chief Complaint:  Generalized weakness; failure to thrive, cough, smelly urine  HPI: 66 y/o male with pmh of HTN, schizophrenia and chronic hx of left femur fracture; came into the ED secondary to generalized weakness, failure to thrive, cough and smelly urine as per family members; he is also not able to do much at home and at this point pretty much lying on bed all day. He denies any CP, SOB, fever, diarrhea, nausea or vomiting. Patient reports some abdominal discomfort and also low back pain and legs cramping.  In the ED he was found to have hyponatremia, hypokalemia and also UA suggesting of UTI.  Allergies:  No Known Allergies    Past Medical History  Diagnosis Date  . Hypertension   . Stroke   . Esophageal stricture   . PTSD (post-traumatic stress disorder)     Past Surgical History  Procedure Date  . Orif femur fracture- liss plate   . Melanoma excision     Prior to Admission medications   Medication Sig Start Date End Date Taking? Authorizing Provider  acetaminophen (TYLENOL) 500 MG tablet Take 1,000 mg by mouth every 6 (six) hours as needed. For pain.   Yes Historical Provider, MD  amLODipine (NORVASC) 5 MG tablet Take 5 mg by mouth every morning.   Yes Historical Provider, MD  aspirin EC 325 MG tablet Take 325 mg by mouth daily.   Yes Historical Provider, MD  Cholecalciferol (VITAMIN D3) 1000 UNITS CAPS Take 1 capsule by mouth every morning.   Yes Historical Provider, MD  hydrochlorothiazide (HYDRODIURIL) 12.5 MG tablet Take 12.5 mg by mouth every morning.   Yes Historical Provider, MD  metoprolol succinate (TOPROL-XL) 25 MG 24 hr tablet Take 25 mg by mouth every morning.   Yes Historical Provider, MD  Multiple Vitamin (MULITIVITAMIN WITH MINERALS) TABS Take 1 tablet by mouth daily.   Yes Historical Provider, MD  risperiDONE (RISPERDAL) 1 MG tablet Take 1 mg by mouth daily.   Yes Historical Provider, MD  vitamin E 400  UNIT capsule Take 400 Units by mouth every morning.   Yes Historical Provider, MD    Social History:  reports that he has been smoking.  He has never used smokeless tobacco. He reports that he does not drink alcohol or use illicit drugs.  History reviewed. No pertinent family history.  Review of Systems:  ROS negative except as otherwise mentioned on HPI.   Physical Exam: Blood pressure 126/63, pulse 78, temperature 99.3 F (37.4 C), temperature source Oral, resp. rate 18, SpO2 96.00%. WUJ:WJXB affect, AAOX2; no acute distress HENT:  Head: Normocephalic and atraumatic.  Mouth/Throat: Oropharynx is clear; no teeth; mild dryness.  Eyes: EOM are normal. Pupils are equal, round, and reactive to light.  Neck: Normal range of motion. Neck supple.  Cardiovascular: Normal rate, regular rhythm and normal heart sounds. Exam reveals no gallop and no friction rub. No murmur heard.  Pulmonary/Chest: Effort normal and breath sounds normal. No wheezing Abdominal: Soft. Bowel sounds are normal. suprapubic tenderness. No rebound or guard.  Musculoskeletal: Normal range of motion. No edema, no clubbing. Neurological:non focal deficit; CN grossly intact; MS 3-5 bilaterally due poor effort and weakness.  Labs on Admission:  Results for orders placed during the hospital encounter of 02/16/12 (from the past 48 hour(s))  CBC     Status: Abnormal   Collection Time   02/16/12 12:15 PM      Component Value Range Comment  WBC 11.2 (*) 4.0 - 10.5 (K/uL)    RBC 4.62  4.22 - 5.81 (MIL/uL)    Hemoglobin 14.1  13.0 - 17.0 (g/dL)    HCT 16.1 (*) 09.6 - 52.0 (%)    MCV 82.7  78.0 - 100.0 (fL)    MCH 30.5  26.0 - 34.0 (pg)    MCHC 36.9 (*) 30.0 - 36.0 (g/dL)    RDW 04.5  40.9 - 81.1 (%)    Platelets 259  150 - 400 (K/uL)   DIFFERENTIAL     Status: Abnormal   Collection Time   02/16/12 12:15 PM      Component Value Range Comment   Neutrophils Relative 88 (*) 43 - 77 (%)    Lymphocytes Relative 8 (*) 12 - 46  (%)    Monocytes Relative 4  3 - 12 (%)    Eosinophils Relative 0  0 - 5 (%)    Basophils Relative 0  0 - 1 (%)    Neutro Abs 9.9 (*) 1.7 - 7.7 (K/uL)    Lymphs Abs 0.9  0.7 - 4.0 (K/uL)    Monocytes Absolute 0.4  0.1 - 1.0 (K/uL)    Eosinophils Absolute 0.0  0.0 - 0.7 (K/uL)    Basophils Absolute 0.0  0.0 - 0.1 (K/uL)    Smear Review MORPHOLOGY UNREMARKABLE     BASIC METABOLIC PANEL     Status: Abnormal   Collection Time   02/16/12 12:15 PM      Component Value Range Comment   Sodium 121 (*) 135 - 145 (mEq/L)    Potassium 2.8 (*) 3.5 - 5.1 (mEq/L)    Chloride 78 (*) 96 - 112 (mEq/L)    CO2 31  19 - 32 (mEq/L)    Glucose, Bld 121 (*) 70 - 99 (mg/dL)    BUN 8  6 - 23 (mg/dL)    Creatinine, Ser 9.14 (*) 0.50 - 1.35 (mg/dL)    Calcium 9.2  8.4 - 10.5 (mg/dL)    GFR calc non Af Amer >90  >90 (mL/min)    GFR calc Af Amer >90  >90 (mL/min)   URINALYSIS, ROUTINE W REFLEX MICROSCOPIC     Status: Abnormal   Collection Time   02/16/12 12:32 PM      Component Value Range Comment   Color, Urine YELLOW  YELLOW     APPearance CLOUDY (*) CLEAR     Specific Gravity, Urine 1.015  1.005 - 1.030     pH 7.5  5.0 - 8.0     Glucose, UA NEGATIVE  NEGATIVE (mg/dL)    Hgb urine dipstick NEGATIVE  NEGATIVE     Bilirubin Urine NEGATIVE  NEGATIVE     Ketones, ur >80 (*) NEGATIVE (mg/dL)    Protein, ur NEGATIVE  NEGATIVE (mg/dL)    Urobilinogen, UA 0.2  0.0 - 1.0 (mg/dL)    Nitrite POSITIVE (*) NEGATIVE     Leukocytes, UA NEGATIVE  NEGATIVE    URINE MICROSCOPIC-ADD ON     Status: Abnormal   Collection Time   02/16/12 12:32 PM      Component Value Range Comment   WBC, UA 0-2  <3 (WBC/hpf)    Bacteria, UA FEW (*) RARE     Urine-Other AMORPHOUS URATES/PHOSPHATES       Radiological Exams on Admission: Dg Abd Acute W/chest  02/16/2012  *RADIOLOGY REPORT*  Clinical Data: Acute abdominal pain and constipation  ACUTE ABDOMEN SERIES (ABDOMEN 2 VIEW & CHEST 1 VIEW)  Comparison: None.  Findings:  The heart  size is normal.  There is no pleural effusion or pulmonary edema.  No airspace consolidation identified.  Chronic bronchitic changes are noted.  Air-filled loops of large and small bowel are noted bilaterally.  There is a moderate amount of desiccated stool within the proximal right colon.  IMPRESSION:  1.  Nonspecific bowel gas pattern without evidence for high-grade obstruction. 2.  Chronic bronchitic changes.  Original Report Authenticated By: Rosealee Albee, M.D.     Assessment/Plan 1-Dehydration with hyponatremia: 2/2 to decrease food and fluid intake and continue of HCTZ. Will provide fluid resuscitation, check random Na and also cortisol level and TSH. Will follow sodium level.  2-AMS: mild and most likely 2/2 encephalopathy due to UTI or electrolytes disturbances (especially hyponatremia); will check urine cx, start empiric treatment with levaquin and replete electrolytes. No focal deficit. Other consideration is medication, especially risperdal. Will use low dose cogentin and follow response.  3-UTI (lower urinary tract infection): empiric levaquin tx; follow cx.  4-Generalized weakness: will rehydrate, ask for PT evaluation and treatment.  5-HTN (hypertension): will continue amlodipine; will hold HCTZ.  6-Cough: probably due to bronchitis vs smoking. Will be cover by empiric levaquin; after fluid resuscitation will repeat CXR to r/o any abnormalities.  7-Tobacco abuse: counseling provided; will use nicotine patch.  8-Severe protein-calorie malnutrition: encourage PO intake and start ensure.  9-Hyponatremia: as above. Will hydrate, check cortisol, discontinue HCTZ, check random Na in urine and follow trend.  10-Hypokalemia: will check Mg level and replete potassium.  11-Schizophrenia: continue risperdal.  12-Depression: will start remeron and low dose cogentin.  13-DVT: lovenox   Time Spent on Admission: 60 minutes  Alan [name] [facility]  Hospitalist [phone]  02/16/2012, 6:45 PM

## 2012-02-17 ENCOUNTER — Inpatient Hospital Stay (HOSPITAL_COMMUNITY): Payer: Medicare Other

## 2012-02-17 LAB — BASIC METABOLIC PANEL
Chloride: 90 mEq/L — ABNORMAL LOW (ref 96–112)
GFR calc Af Amer: >90 mL/min (ref >90)
GFR calc non Af Amer: >90 mL/min (ref >90)
Potassium: 4.3 mEq/L (ref 3.5–5.1)

## 2012-02-17 LAB — CBC
HCT: 36.9 % — ABNORMAL LOW (ref 39.0–52.0)
Hemoglobin: 13.6 g/dL (ref 13.0–17.0)
RDW: 12.2 % (ref 11.5–15.5)
WBC: 6.9 10*3/uL (ref 4.0–10.5)

## 2012-02-17 LAB — CORTISOL: Cortisol, Plasma: 8.1 ug/dL

## 2012-02-17 LAB — VITAMIN B12: Vitamin B-12: >2000 pg/mL — ABNORMAL HIGH (ref 211–911)

## 2012-02-17 LAB — SODIUM, URINE, RANDOM: Sodium, Ur: 53 mEq/L

## 2012-02-17 MED ORDER — CARVEDILOL 25 MG PO TABS
25.0000 mg | ORAL_TABLET | Freq: Two times a day (BID) | ORAL | Status: DC
  Administered 2012-02-17 – 2012-02-20 (×6): 25 mg via ORAL
  Filled 2012-02-17 (×8): qty 1

## 2012-02-17 MED ORDER — TIOTROPIUM BROMIDE MONOHYDRATE 18 MCG IN CAPS
18.0000 ug | ORAL_CAPSULE | Freq: Every day | RESPIRATORY_TRACT | Status: DC
  Administered 2012-02-18 – 2012-02-20 (×3): 18 ug via RESPIRATORY_TRACT
  Filled 2012-02-17: qty 5

## 2012-02-17 NOTE — Progress Notes (Signed)
INITIAL ADULT NUTRITION ASSESSMENT Date: 02/17/2012   Time: 12:11 PM Reason for Assessment: Screened for nutrition risk for dysphagia and malnutrition  ASSESSMENT: Male 66 y.o.  Dx: Generalized weakness; failure to thrive, cough, smelly urine  Hx:  Past Medical History  Diagnosis Date  . Hypertension   . Stroke   . Esophageal stricture   . PTSD (post-traumatic stress disorder)     Related Meds:  Scheduled Meds:   . sodium chloride   Intravenous Once  . amLODipine  5 mg Oral q morning - 10a  . aspirin EC  81 mg Oral Daily  . benztropine  0.5 mg Oral BID  . cholecalciferol  1,000 Units Oral Daily  . enoxaparin  40 mg Subcutaneous Q24H  . feeding supplement  237 mL Oral TID WC  . levofloxacin (LEVAQUIN) IV  500 mg Intravenous Q24H  . mirtazapine  15 mg Oral QHS  . mulitivitamin with minerals  1 tablet Oral Daily  . nicotine  14 mg Transdermal Daily  . potassium chloride  10 mEq Intravenous Once  . potassium chloride  40 mEq Oral Q4H  . risperiDONE  1 mg Oral Daily  . sodium chloride  3 mL Intravenous Q12H  . vitamin E  400 Units Oral q morning - 10a  . DISCONTD: sodium chloride   Intravenous Once  . DISCONTD: sodium chloride  1,000 mL Intravenous Once  . DISCONTD: Vitamin D3  1 capsule Oral q morning - 10a   Continuous Infusions:   . 0.9 % NaCl with KCl 40 mEq / L 100 mL/hr at 02/17/12 0727   PRN Meds:.acetaminophen, acetaminophen, morphine, ondansetron (ZOFRAN) IV, ondansetron, DISCONTD: ondansetron (ZOFRAN) IV   Ht: 5\' 5"  (165.1 cm)  Wt: 100 lb (45.36 kg)  Ideal Wt: 61.8 kg % Ideal Wt: 73.5%  Usual Wt: unknown Wt Readings from Last 10 Encounters:  02/16/12 100 lb (45.36 kg)    Body mass index is 16.64 kg/(m^2). (Underweight)   Food/Nutrition Related Hx: Patient appears thin with wasting. Patient denies any problems with chewing or swallowing. He reported that he likes the Ensure nutrition supplement. He reported he has been eating well. Per RN patient ate  well at breakfast meal. Patient voiced snack preferences.   Labs:  CMP     Component Value Date/Time   NA 127* 02/17/2012 0521   K 4.3 02/17/2012 0521   CL 90* 02/17/2012 0521   CO2 29 02/17/2012 0521   GLUCOSE 95 02/17/2012 0521   BUN 8 02/17/2012 0521   CREATININE 0.52 02/17/2012 0521   CALCIUM 8.5 02/17/2012 0521   PROT 6.0 02/16/2012 1923   ALBUMIN 3.3* 02/16/2012 1923   AST 21 02/16/2012 1923   ALT 18 02/16/2012 1923   ALKPHOS 79 02/16/2012 1923   BILITOT 0.2* 02/16/2012 1923   GFRNONAA >90 02/17/2012 0521   GFRAA >90 02/17/2012 0521    Intake/Output Summary (Last 24 hours) at 02/17/12 1216 Last data filed at 02/17/12 0700  Gross per 24 hour  Intake   1360 ml  Output   1275 ml  Net     85 ml     Diet Order: Cardiac  Supplements/Tube Feeding: Ensure TID. Provides 750 kcal and 27 grams of protein daily.   IVF:    0.9 % NaCl with KCl 40 mEq / L Last Rate: 100 mL/hr at 02/17/12 0727    Estimated Nutritional Needs:   Kcal: 1610-9604 Protein: 68-80.36 grams Fluid: 1 ml per kcal intake  NUTRITION DIAGNOSIS: -Increased nutrient needs (  NI-5.1).  Status: Ongoing -Underweight r/t low weight for height a/e/b BMI of 16.64 kg/m^2.   RELATED TO: underweight and diagnosis failure to throve  AS EVIDENCE BY: patient with BMI of 16.64 kg/m^2.   MONITORING/EVALUATION(Goals): PO intake, weight trends, labs, I/O's 1. PO intake > 75% at meals, supplements and snacks 2. Minimize weight loss/ promote weight gain.   EDUCATION NEEDS: -No education needs identified at this time  INTERVENTION: 1. Will order patient snacks BID. 2. Will continue to provide Ensure TID.  3. RD to follow for nutrition plan of care.   Dietitian [phone]  DOCUMENTATION CODES Per approved criteria  -Severe malnutrition in the context of acute illness or injury  *Patient meets criteria for severe malnutrition in the context of acute illness or injury due to apparent moderate loss of body fat and moderate  loss of muscle fat.   Iven Finn [facility] 02/17/2012, 12:11 PM

## 2012-02-17 NOTE — Evaluation (Signed)
Physical Therapy Evaluation Patient Details Name: Alan [name] MRN: [id number] DOB: 08/05/1946 Today's Date: 02/17/2012 Time: 7829-5621 PT Time Calculation (min): 26 min  PT Assessment / Plan / Recommendation Clinical Impression  Pt is 66 yo male with weakness, UTI, and FTT whose activity level had been decreasing at home over several months and then acutely decreased with pt c/o increased LBP/ buttocks pain.  Pt's pain is not affected by leg mvmt but is affected by position, ie. sitting and standing are worse than supine/ SL.  Nsg has noted redness on pt's buttocks and pt to recieive lumbar x-ray today.  At this point, PT would not recommend pt returning home as he cannot care for himself.  Recommend d/c to SNF for rehab, pr agreeable and requests return to [facility] in [place].  PT will follow acutely to help pt increase strength and mobility as well as tolerance for activity.     PT Assessment  Patient needs continued PT services    Follow Up Recommendations  Skilled nursing facility;Supervision/Assistance - 24 hour    Equipment Recommendations  Defer to next venue    Frequency Min 3X/week    Precautions / Restrictions Precautions Precautions: Fall Precaution Comments: pt denies any falls at home but is increased fall risk with increaed pain and inability to mobilize OOB Restrictions Weight Bearing Restrictions: No   Pertinent Vitals/Pain O2 sats sitting EOB           84%               HR 114                Return to supine  90%               HR 100 RN notified and O2 applied after treatment       Mobility  Bed Mobility Bed Mobility: Rolling Left;Left Sidelying to Sit;Sitting - Scoot to [employer] of Bed;Sit to Supine Rolling Left: 4: Min assist Left Sidelying to Sit: 4: Min assist;HOB flat Sitting - Scoot to [place] of Bed: 4: Min assist Sit to Supine: 1: +2 Total assist;HOB flat Sit to Supine: Patient Percentage: 60% Details for Bed Mobility Assistance: pt very weak,  needing assistance to complete end range of transfers as he fatigues.  +1 used for safety.  Pt very uncomfortable once he was in a sitting position. Transfers Transfers: Sit to Stand;Stand to Sit Sit to Stand: 1: +2 Total assist;With upper extremity assist;From bed Sit to Stand: Patient Percentage: 50% Stand to Sit: 1: +2 Total assist;To bed;With upper extremity assist Stand to Sit: Patient Percentage: 60% Details for Transfer Assistance: pt with difficulty activating hip and knee extensors for sit to stand.  Unable to tolerate standing for more than 1 minute due to back discomfort. Ambulation/Gait Ambulation/Gait Assistance: Other (comment) (unable) Stairs: No Wheelchair Mobility Wheelchair Mobility: No         PT Goals Acute Rehab PT Goals PT Goal Formulation: With patient Time For Goal Achievement: 03/02/12 Potential to Achieve Goals: Fair Pt will go Supine/Side to Sit: with supervision PT Goal: Supine/Side to Sit - Progress: Goal set today Pt will go Sit to Supine/Side: with supervision PT Goal: Sit to Supine/Side - Progress: Goal set today Pt will go Sit to Stand: with min assist PT Goal: Sit to Stand - Progress: Goal set today Pt will go Stand to Sit: with min assist PT Goal: Stand to Sit - Progress: Goal set today Pt will Ambulate: 16 - 50  feet;with rolling walker;with min assist PT Goal: Ambulate - Progress: Goal set today Pt will Perform Home Exercise Program: with supervision, verbal cues required/provided PT Goal: Perform Home Exercise Program - Progress: Goal set today  Visit Information  Last PT Received On: 02/17/12 Assistance Needed: +2    Subjective Data  Subjective: "I don't know why my back has been hurting so much more.  I haven't been able to do anything for the past 3 days" Patient Stated Goal: return home, decreased pain   Prior Functioning  Home Living Lives With: Alone Available Help at Discharge: Family;Available PRN/intermittently Type of Home:  House Home Access: Stairs to enter [employer] of Steps: 3 Home Layout: One level Bathroom Shower/Tub: Heritage manager Accessibility: Yes How Accessible: Accessible via walker Home Adaptive Equipment: Built-in shower seat;Walker - rolling Additional Comments: Pt has embankment to walk down to get to house from drive which is where he fell when he broke his left femur.  He went to [facility] for rehab and left ambulating well with RW per family.  His function has declined since Christmastime with acute worsening in past week with increased pain and inability to care for self.  Pt's sister-in-law and nephews live down the street and check on him daily as well as bringing him food. Prior Function Level of Independence: Independent with assistive device(s) Able to Take Stairs?: Yes Driving: No Vocation: Retired Musician: No difficulties    Cognition  Overall Cognitive Status: History of cognitive impairments - at baseline Arousal/Alertness: Awake/alert Orientation Level: Appears intact for tasks assessed Behavior During Session: [facility] for tasks performed Cognition - Other Comments: pt with decreased verbalization.  Is appropriate in conversation but provides minimal details, family filled in gaps for pt after session.  Follows all commands.  Decreased self-awareness and ability to verbalize exactly where pain is and describe it.    Extremity/Trunk Assessment Right Upper Extremity Assessment RUE ROM/Strength/Tone: Deficits RUE ROM/Strength/Tone Deficits: pt with generalized weakness throughout UE's and LE's as well as core.  UE's not specifically tested, defer to OT eval. RUE Coordination: WFL - gross motor Left Upper Extremity Assessment LUE ROM/Strength/Tone: Deficits LUE ROM/Strength/Tone Deficits: equal to RUE, see note LUE Coordination: WFL - gross motor Right Lower Extremity Assessment RLE ROM/Strength/Tone: Deficits RLE ROM/Strength/Tone  Deficits: hip flex 3/5, knee flex 3/5, knee ext 3/5, difficulty achieving full knee extension, tightness noted in hamstrings. RLE Sensation: WFL - Light Touch;WFL - Proprioception RLE Coordination: WFL - gross motor Left Lower Extremity Assessment LLE ROM/Strength/Tone: Deficits LLE ROM/Strength/Tone Deficits: hip flex 3/5, knee flex 3/5, knee ext 3/5, difficulty achieving full knee ext due to tightness in hamstrings.  LE ROM does not affect low back/ buttocks pain. LLE Sensation: WFL - Light Touch;WFL - Proprioception LLE Coordination: WFL - gross motor Trunk Assessment Trunk Assessment: Other exceptions (poor trunk control in sitting)   Balance Balance Balance Assessed: Yes Static Sitting Balance Static Sitting - Balance Support: Bilateral upper extremity supported;Feet supported Static Sitting - Level of Assistance: 4: Min assist Static Sitting - Comment/# of Minutes: 4 min/ posterior lean at first, able to progress to supervision, but unable to handle perturbations Static Standing Balance Static Standing - Balance Support: Bilateral upper extremity supported Static Standing - Level of Assistance: 1: +2 Total assist;Patient percentage (comment) (pt 50%) Static Standing - Comment/# of Minutes: 1 min/ pt very uncomfortable in standing, unable to step feet for pregait activities, unable to stand long enough to practice wt-shifting.  End of Session PT -  End of Session Equipment Utilized During Treatment: Gait belt Activity Tolerance: Patient limited by fatigue;Patient limited by pain Patient left: in bed;with call bell/phone within reach (family arrived later and was able to speak with them then) Nurse Communication: Mobility status;Other (comment) (RN notified re: O2 sats and HR, O2 applied)   Gates Jividen, [place] 02/17/2012, 2:25 PM  Lyanne Co, PT  Acute [employer]  [phone]

## 2012-02-17 NOTE — Progress Notes (Signed)
Subjective: Feeling slightly better; no fever; denies dysuria or CP. Still coughing and with desaturation into 84-85% with minimal exertion during PT evaluation.  Objective: Vital signs in last 24 hours: Temp:  [98.3 F (36.8 C)-99.3 F (37.4 C)] 98.5 F (36.9 C) (04/29 0534) Pulse Rate:  [69-83] 69  (04/29 0534) Resp:  [18] 18  (04/29 0534) BP: (126-165)/(63-76) 165/66 mmHg (04/29 0534) SpO2:  [93 %-98 %] 93 % (04/29 0534) Weight:  [45.36 kg (100 lb)-93.5 kg (206 lb 2.1 oz)] 93.5 kg (206 lb 2.1 oz) (04/29 1100) Weight change:  Last BM Date:  (PTA; Pt unable to recall. )  Intake/Output from previous day: 04/28 0701 - 04/29 0700 In: 1360 [P.O.:360; I.V.:1000] Out: 1275 [Urine:1275] Total I/O In: -  Out: 1500 [Urine:1500]   Physical Exam: General: Alert, awake, oriented x3, in no acute distress. HEENT: No bruits, no goiter. Heart: Regular rate and rhythm, without murmurs, rubs, gallops. Lungs: mild exp wheezing mid lungs; no crackles; fair air movement. Abdomen: Soft, nontender, nondistended, positive bowel sounds. Extremities: No clubbing, cyanosis or edema with positive pedal pulses. Neuro: Grossly intact, nonfocal.  Lab Results: Basic Metabolic Panel:  Basename 02/17/12 0521 02/16/12 1923 02/16/12 1215  NA 127* -- 121*  K 4.3 -- 2.8*  CL 90* -- 78*  CO2 29 -- 31  GLUCOSE 95 -- 121*  BUN 8 -- 8  CREATININE 0.52 -- 0.42*  CALCIUM 8.5 -- 9.2  MG -- 1.8 --  PHOS -- 2.5 --   Liver Function Tests:  [facility] 02/16/12 1923  AST 21  ALT 18  ALKPHOS 79  BILITOT 0.2*  PROT 6.0  ALBUMIN 3.3*   CBC:  Basename 02/17/12 0521 02/16/12 1215  WBC 6.9 11.2*  NEUTROABS -- 9.9*  HGB 13.6 14.1  HCT 36.9* 38.2*  MCV 83.5 82.7  PLT 263 259   Thyroid Function Tests:  Basename 02/16/12 1923  TSH 2.198  T4TOTAL --  FREET4 --  T3FREE --  THYROIDAB --   Anemia Panel:  Basename 02/16/12 1923  VITAMINB12 >2000*  FOLATE --  FERRITIN --  TIBC --  IRON --    RETICCTPCT --   Urinalysis:  Basename 02/16/12 1232  COLORURINE YELLOW  LABSPEC 1.015  PHURINE 7.5  GLUCOSEU NEGATIVE  HGBUR NEGATIVE  BILIRUBINUR NEGATIVE  KETONESUR >80*  PROTEINUR NEGATIVE  UROBILINOGEN 0.2  NITRITE POSITIVE*  LEUKOCYTESUR NEGATIVE   Studies/Results: Dg Abd Acute W/chest  02/16/2012  *RADIOLOGY REPORT*  Clinical Data: Acute abdominal pain and constipation  ACUTE ABDOMEN SERIES (ABDOMEN 2 VIEW & CHEST 1 VIEW)  Comparison: None.  Findings:  The heart size is normal.  There is no pleural effusion or pulmonary edema.  No airspace consolidation identified.  Chronic bronchitic changes are noted.  Air-filled loops of large and small bowel are noted bilaterally.  There is a moderate amount of desiccated stool within the proximal right colon.  IMPRESSION:  1.  Nonspecific bowel gas pattern without evidence for high-grade obstruction. 2.  Chronic bronchitic changes.  Original Report Authenticated By: Rosealee Albee, M.D.    Medications: Scheduled Meds:   . sodium chloride   Intravenous Once  . amLODipine  5 mg Oral q morning - 10a  . aspirin EC  81 mg Oral Daily  . benztropine  0.5 mg Oral BID  . carvedilol  25 mg Oral BID WC  . cholecalciferol  1,000 Units Oral Daily  . enoxaparin  40 mg Subcutaneous Q24H  . feeding supplement  237 mL Oral TID  WC  . levofloxacin (LEVAQUIN) IV  500 mg Intravenous Q24H  . mirtazapine  15 mg Oral QHS  . mulitivitamin with minerals  1 tablet Oral Daily  . nicotine  14 mg Transdermal Daily  . potassium chloride  10 mEq Intravenous Once  . potassium chloride  40 mEq Oral Q4H  . risperiDONE  1 mg Oral Daily  . sodium chloride  3 mL Intravenous Q12H  . vitamin E  400 Units Oral q morning - 10a  . DISCONTD: sodium chloride   Intravenous Once  . DISCONTD: sodium chloride  1,000 mL Intravenous Once  . DISCONTD: Vitamin D3  1 capsule Oral q morning - 10a   Continuous Infusions:   . 0.9 % NaCl with KCl 40 mEq / L 100 mL/hr at  02/17/12 0727   PRN Meds:.acetaminophen, acetaminophen, morphine, ondansetron (ZOFRAN) IV, ondansetron, DISCONTD: ondansetron (ZOFRAN) IV  Assessment/Plan: 1-Dehydration with hyponatremia: 2/2 to decrease food and fluid intake and continue use of HCTZ. Na 127; patient feeling slightly better. Continue IVF's; encourage PO intake. TSH and cortisol WNL.  2-AMS: mild and most likely 2/2 encephalopathy due to UTI or electrolytes disturbances (especially hyponatremia); urine cx pending; continue empiric treatment with levaquin and replete electrolytes. No focal deficit. Other consideration is medication, especially risperdal. Will continue daily low dose cogentin and follow response.   3-UTI (lower urinary tract infection): empiric levaquin tx; follow cx.   4-Generalized weakness: Continue IVF;s; continue good PO intake; will treat infection and follow PT recommendations. He is really decondition and with lack of support at home, might need short term SNF to help regaining strength.  5-HTN (hypertension): will continue amlodipine and start coreg BID' HR and BP going up; was using lopressor at home.  6-Cough and hypoxia: probably due to bronchitis vs smoking. Will be cover by empiric levaquin; will provide oxygen supplementation and start Spiriva. Patient chronic bronchitis changes and COPD.  7-Tobacco abuse: counseling provided; will continue nicotine patch.   8-Severe protein-calorie malnutrition: encourage PO intake and start ensure.   9-Hyponatremia: as above. Improving with IVF's.  10-Hypokalemia: repleted  11-Schizophrenia: continue risperdal.   12-Depression: will continue remeron and low dose cogentin.     LOS: 1 day   Cadon Raczka [facility] [phone]  02/17/2012, 1:06 PM

## 2012-02-18 LAB — BASIC METABOLIC PANEL
BUN: 7 mg/dL (ref 6–23)
Calcium: 8.9 mg/dL (ref 8.4–10.5)
GFR calc Af Amer: >90 mL/min (ref >90)
GFR calc non Af Amer: >90 mL/min (ref >90)
Glucose, Bld: 101 mg/dL — ABNORMAL HIGH (ref 70–99)
Sodium: 126 mEq/L — ABNORMAL LOW (ref 135–145)

## 2012-02-18 MED ORDER — SODIUM CHLORIDE 0.9 % IV SOLN
INTRAVENOUS | Status: DC
  Administered 2012-02-18: 10 mL/h via INTRAVENOUS

## 2012-02-18 MED ORDER — LEVOFLOXACIN 500 MG PO TABS
500.0000 mg | ORAL_TABLET | ORAL | Status: DC
  Administered 2012-02-18 – 2012-02-19 (×2): 500 mg via ORAL
  Filled 2012-02-18 (×3): qty 1

## 2012-02-18 MED ORDER — SODIUM CHLORIDE 1 G PO TABS
1.0000 g | ORAL_TABLET | Freq: Every day | ORAL | Status: DC
  Administered 2012-02-18 – 2012-02-20 (×3): 1 g via ORAL
  Filled 2012-02-18 (×3): qty 1

## 2012-02-18 MED ORDER — AMLODIPINE BESYLATE 10 MG PO TABS
10.0000 mg | ORAL_TABLET | Freq: Every morning | ORAL | Status: DC
  Administered 2012-02-19 – 2012-02-20 (×2): 10 mg via ORAL
  Filled 2012-02-18 (×2): qty 1

## 2012-02-18 NOTE — Progress Notes (Signed)
PHARMACIST - PHYSICIAN COMMUNICATION CONCERNING: Antibiotic IV to Oral Route Change Policy  RECOMMENDATION: This patient is receiving Levaquin by the intravenous route.  Based on criteria approved by the Pharmacy and Therapeutics Committee, the antibiotic(s) is/are being converted to the equivalent oral dose form(s).   DESCRIPTION: These criteria include:  Patient being treated for a respiratory tract infection, urinary tract infection, or cellulitis  The patient is not neutropenic and does not exhibit a GI malabsorption state  The patient is eating (either orally or via tube) and/or has been taking other orally administered medications for a least 24 hours  The patient is improving clinically and has a Tmax < 100.5  If you have questions about this conversion, please contact the Pharmacy Department  []  ( [phone] )  [place] []  ( [phone] )  [employer]  []  ( [phone] )  [facility] [x]  ( [phone] )   [facility]    

## 2012-02-18 NOTE — Progress Notes (Signed)
Subjective: Feeling better; no fever; denies dysuria or CP. Decrease in his coughing spell and now with O2 sta in 96% on 2L. No abdominal pain, no nausea, no vomiting.  Objective: Vital signs in last 24 hours: Temp:  [97.3 F (36.3 C)-99.2 F (37.3 C)] 97.3 F (36.3 C) (04/30 0539) Pulse Rate:  [73-90] 90  (04/30 0539) Resp:  [16] 16  (04/30 0539) BP: (105-170)/(61-82) 170/82 mmHg (04/30 0539) SpO2:  [95 %-98 %] 96 % (04/30 0822) Weight:  [41.368 kg (91 lb 3.2 oz)] 41.368 kg (91 lb 3.2 oz) (04/30 0539) Weight change: -2.948 kg (-6 lb 8 oz) Last BM Date:  (PTA)  Intake/Output from previous day: 04/29 0701 - 04/30 0700 In: 2550 [P.O.:480; I.V.:1870; IV Piggyback:200] Out: 3200 [Urine:3200] Total I/O In: 480 [P.O.:480] Out: 500 [Urine:500]   Physical Exam: General: Alert, awake, oriented x3, in no acute distress. HEENT: No bruits, no goiter. Heart: Regular rate and rhythm, without murmurs, rubs, gallops. Lungs: mild exp wheezing mid lungs; no crackles; fair air movement. Abdomen: Soft, nontender, nondistended, positive bowel sounds. Extremities: No clubbing, cyanosis or edema with positive pedal pulses. Neuro: Grossly intact, nonfocal.  Lab Results: Basic Metabolic Panel:  Basename 02/18/12 0514 02/17/12 0521 02/16/12 1923  NA 126* 127* --  K 4.5 4.3 --  CL 90* 90* --  CO2 25 29 --  GLUCOSE 101* 95 --  BUN 7 8 --  CREATININE 0.48* 0.52 --  CALCIUM 8.9 8.5 --  MG -- -- 1.8  PHOS -- -- 2.5   Liver Function Tests:  [facility] 02/16/12 1923  AST 21  ALT 18  ALKPHOS 79  BILITOT 0.2*  PROT 6.0  ALBUMIN 3.3*   CBC:  Basename 02/17/12 0521 02/16/12 1215  WBC 6.9 11.2*  NEUTROABS -- 9.9*  HGB 13.6 14.1  HCT 36.9* 38.2*  MCV 83.5 82.7  PLT 263 259   Thyroid Function Tests:  Basename 02/16/12 1923  TSH 2.198  T4TOTAL --  FREET4 --  T3FREE --  THYROIDAB --   Anemia Panel:  Basename 02/16/12 1923  VITAMINB12 >2000*  FOLATE --  FERRITIN --  TIBC --    IRON --  RETICCTPCT --   Urinalysis:  Basename 02/16/12 1232  COLORURINE YELLOW  LABSPEC 1.015  PHURINE 7.5  GLUCOSEU NEGATIVE  HGBUR NEGATIVE  BILIRUBINUR NEGATIVE  KETONESUR >80*  PROTEINUR NEGATIVE  UROBILINOGEN 0.2  NITRITE POSITIVE*  LEUKOCYTESUR NEGATIVE   Studies/Results: Dg Lumbar Spine 2-3 Views  02/17/2012  *RADIOLOGY REPORT*  Clinical Data: Low back pain, no recent injury  LUMBAR SPINE - 2-3 VIEW  Comparison: Abdomen film of 02/16/2012  Findings: Both large and small bowel gas is present most consistent with diffuse ileus.  The bones appear somewhat osteopenic.  However the lumbar vertebrae are in normal alignment.  Intervertebral disc spaces appear normal.  No compression deformity is seen.  IMPRESSION: Normal alignment.  Normal disc spaces.  Question ileus.  Original Report Authenticated By: Juline Patch, M.D.    Medications: Scheduled Meds:    . amLODipine  10 mg Oral q morning - 10a  . aspirin EC  81 mg Oral Daily  . benztropine  0.5 mg Oral BID  . carvedilol  25 mg Oral BID WC  . cholecalciferol  1,000 Units Oral Daily  . enoxaparin  40 mg Subcutaneous Q24H  . feeding supplement  237 mL Oral TID WC  . levofloxacin (LEVAQUIN) IV  500 mg Intravenous Q24H  . mirtazapine  15 mg Oral QHS  .  mulitivitamin with minerals  1 tablet Oral Daily  . nicotine  14 mg Transdermal Daily  . risperiDONE  1 mg Oral Daily  . sodium chloride  3 mL Intravenous Q12H  . sodium chloride  1 g Oral Daily  . tiotropium  18 mcg Inhalation Daily  . vitamin E  400 Units Oral q morning - 10a  . DISCONTD: amLODipine  5 mg Oral q morning - 10a   Continuous Infusions:    . sodium chloride    . DISCONTD: 0.9 % NaCl with KCl 40 mEq / L 75 mL/hr (02/18/12 1058)   PRN Meds:.acetaminophen, acetaminophen, morphine, ondansetron (ZOFRAN) IV, ondansetron  Assessment/Plan: 1-Dehydration with hyponatremia: 2/2 to decrease food and fluid intake and continue use of HCTZ. Na 126 today; patient  feeling better. Will start sodium tablets; encourage PO intake; maintenance IVF's and follow BMET in am.  2-AMS: mild and most likely 2/2 encephalopathy due to UTI or electrolytes disturbances (especially hyponatremia); urine cx pending; continue empiric treatment with levaquin and replete electrolytes. No focal deficit. Other consideration is medication, especially risperdal. Will continue cogentin.   3-Gram negative rods UTI (lower urinary tract infection): Continue empiric levaquin tx; follow cx.   4-Generalized weakness: Per PT recommendations are for SNF.  Continue good PO intake; antibiotics for the UTI and acute PT therapy while in house.   5-HTN (hypertension): elevated. Will increased amlodipine to 10mg  daily and continue coreg.  6-Cough and hypoxia: probably due to bronchitis vs smoking. Will be cover by empiric levaquin; will provide oxygen supplementation and continue Spiriva.   7-Tobacco abuse: counseling provided; will continue nicotine patch.   8-Severe protein-calorie malnutrition: encourage PO intake and continue ensure.   9-Hyponatremia: as above. Improving. Follow NA trend  10-Hypokalemia: repleted  11-Schizophrenia: continue risperdal.   12-Depression: will continue remeron and low dose cogentin.     LOS: 2 days   Kieana Livesay [facility] [phone]  02/18/2012, 1:09 PM

## 2012-02-18 NOTE — Progress Notes (Signed)
Clinical Social Work Department BRIEF PSYCHOSOCIAL ASSESSMENT 02/18/2012  Patient:  Alan [name], Alan [name]     Account Number:  [id number]     Admit date:  02/16/2012  Clinical Social Worker:  Orpah Greek  Date/Time:  02/18/2012 01:46 PM  Referred by:  Physician  Date Referred:  02/18/2012 Referred for  SNF Placement   Other Referral:   Interview type:  Patient Other interview type:    PSYCHOSOCIAL DATA Living Status:  ALONE Admitted from facility:   Level of care:   Primary support name:  Osric Klopf (brother) h#: [phone] Primary support relationship to patient:  SIBLING Degree of support available:    CURRENT CONCERNS Current Concerns  Post-Acute Placement   Other Concerns:    SOCIAL WORK ASSESSMENT / PLAN CSW spoke with patient re: discharge planning. Noted PT recommended SNF. Patient requesting [employer].   Assessment/plan status:  Information/Referral to [facility] Other assessment/ plan:   Information/referral to community resources:   CSW completed FL2 and faxed out to [facility], awaiting call back re: offer/bed availability.    PATIENT'S/FAMILY'S RESPONSE TO PLAN OF CARE: Patient stated that he had been to [facility] in the past & would like to return.        Unice Bailey, LCSWA [phone]

## 2012-02-18 NOTE — ED Provider Notes (Signed)
Medical screening examination/treatment/procedure(s) were performed by non-physician practitioner and as supervising physician I was immediately available for consultation/collaboration.  Daion Ginsberg R. Magnolia Mattila, MD 02/18/12 0046 

## 2012-02-18 NOTE — Progress Notes (Signed)
Clinical Social Work Department CLINICAL SOCIAL WORK PLACEMENT NOTE 02/18/2012  Patient:  Alan [name], Alan [name]  Account Number:  [id number] Admit date:  02/16/2012  Clinical Social Worker:  Orpah Greek  Date/time:  02/18/2012 01:49 PM  Clinical Social Work is seeking post-discharge placement for this patient at the following level of care:   SKILLED NURSING   (*CSW will update this form in Epic as items are completed)   02/18/2012  Patient/family provided with [facility] Department of Clinical Social Work's list of facilities offering this level of care within the geographic area requested by the patient (or if unable, by the patient's family).  02/18/2012  Patient/family informed of their freedom to choose among providers that offer the needed level of care, that participate in Medicare, Medicaid or managed care program needed by the patient, have an available bed and are willing to accept the patient.  02/18/2012  Patient/family informed of [facility]' ownership interest in [facility], as well as of the fact that they are under no obligation to receive care at this facility.  PASARR submitted to EDS on 02/18/2012 PASARR number received from [employer] on 02/18/2012  FL2 transmitted to all facilities in geographic area requested by pt/family on  02/18/2012 FL2 transmitted to all facilities within larger geographic area on   Patient informed that his/her managed care company has contracts with or will negotiate with  certain facilities, including the following:     Patient/family informed of bed offers received:   Patient chooses bed at  Physician recommends and patient chooses bed at    Patient to be transferred to  on   Patient to be transferred to facility by   The following physician request were entered in Epic:   Additional Comments:   Unice Bailey, [employer] [phone]

## 2012-02-19 DIAGNOSIS — E876 Hypokalemia: Secondary | ICD-10-CM

## 2012-02-19 DIAGNOSIS — E860 Dehydration: Secondary | ICD-10-CM

## 2012-02-19 DIAGNOSIS — R627 Adult failure to thrive: Secondary | ICD-10-CM

## 2012-02-19 DIAGNOSIS — E871 Hypo-osmolality and hyponatremia: Secondary | ICD-10-CM

## 2012-02-19 LAB — BASIC METABOLIC PANEL
BUN: 15 mg/dL (ref 6–23)
Creatinine, Ser: 0.58 mg/dL (ref 0.50–1.35)
GFR calc Af Amer: >90 mL/min (ref >90)
GFR calc non Af Amer: >90 mL/min (ref >90)
Potassium: 4.2 mEq/L (ref 3.5–5.1)

## 2012-02-19 LAB — URINE CULTURE: Colony Count: >=100000

## 2012-02-19 LAB — VITAMIN D 1,25 DIHYDROXY
Vitamin D 1, 25 (OH)2 Total: 58 pg/mL (ref 18–72)
Vitamin D2 1, 25 (OH)2: <8 pg/mL
Vitamin D3 1, 25 (OH)2: 58 pg/mL

## 2012-02-19 MED ORDER — SODIUM CHLORIDE 0.9 % IV SOLN
INTRAVENOUS | Status: DC
  Administered 2012-02-20: 02:00:00 via INTRAVENOUS

## 2012-02-19 MED ORDER — ENOXAPARIN SODIUM 30 MG/0.3ML ~~LOC~~ SOLN
30.0000 mg | SUBCUTANEOUS | Status: DC
  Administered 2012-02-19: 30 mg via SUBCUTANEOUS
  Filled 2012-02-19 (×2): qty 0.3

## 2012-02-19 NOTE — Progress Notes (Signed)
Patient ID: Alan [name], male   DOB: [birth year], 66 y.o.   MRN: [id number]  Subjective: No events overnight. Patient denies chest pain, shortness of breath, abdominal pain.   Objective:  Vital signs in last 24 hours:  Filed Vitals:  BP: 115/75  100/62  Pulse:   71  Temp:   98.3 F (36.8 C)  Resp:   20  SpO2: 95% 98% 94%    Intake/Output from previous day:   Intake/Output Summary (Last 24 hours) at 02/19/12 1441 Last data filed at 02/19/12 1100  Gross per 24 hour  Intake 638.67 ml  Output    585 ml  Net  53.67 ml    Physical Exam: General: Alert, awake, oriented to name and place, in no acute distress. HEENT: No bruits, no goiter. Dry mucous membranes, no scleral icterus, no conjunctival pallor. Heart: Regular rate and rhythm, S1/S2 +, no murmurs, rubs, gallops. Lungs: Clear to auscultation bilaterally. No wheezing, no rhonchi, no rales.  Abdomen: Soft, nontender, nondistended, positive bowel sounds. Extremities: No clubbing or cyanosis, no pitting edema,  positive pedal pulses. Neuro: Grossly nonfocal.  Lab Results:  Lab 02/17/12 0521 02/16/12 1215  WBC 6.9 11.2*  HGB 13.6 14.1  HCT 36.9* 38.2*  PLT 263 259    Lab 02/19/12 0435 02/18/12 0514 02/17/12 0521 02/16/12 1215  NA 123* 126* 127* 121*  K 4.2 4.5 4.3 2.8*  CL 88* 90* 90* 78*  CO2 27 25 29 31   GLUCOSE 114* 101* 95 121*  BUN 15 7 8 8   CREATININE 0.58 0.48* 0.52 0.42*  CALCIUM 8.2* 8.9 8.5 9.2  MG -- -- -- 1.8   Recent Results (from the past 240 hour(s))  URINE CULTURE     Status: Normal   Collection Time   02/16/12 12:23 PM      Component Value Range Status Comment   Specimen Description URINE, CATHETERIZED   Final    Special Requests NONE   Final    Culture  Setup Time [id number]   Final    Colony Count >=100,000 COLONIES/ML   Final    Culture PSEUDOMONAS AERUGINOSA   Final    Report Status 02/19/2012 FINAL   Final    Organism ID, Bacteria PSEUDOMONAS AERUGINOSA   Final      Studies/Results: Dg Lumbar Spine 2-3 Views 02/17/2012   IMPRESSION:  Normal alignment.  Normal disc spaces.  Question ileus.    Medications: Scheduled Meds:   . amLODipine  10 mg Oral q morning - 10a  . aspirin EC  81 mg Oral Daily  . benztropine  0.5 mg Oral BID  . carvedilol  25 mg Oral BID WC  . cholecalciferol  1,000 Units Oral Daily  . enoxaparin injection  30 mg Subcutaneous Q24H  . levofloxacin  500 mg Oral Q24H  . mirtazapine  15 mg Oral QHS  . mulitivitamin  1 tablet Oral Daily  . nicotine  14 mg Transdermal Daily  . risperiDONE  1 mg Oral Daily  . sodium chloride  1 g Oral Daily  . tiotropium  18 mcg Inhalation Daily  . vitamin E  400 Units Oral q morning - 10a   Continuous Infusions:   . sodium chloride 10 mL/hr (02/18/12 1355)   PRN Meds:.acetaminophen, acetaminophen, morphine, ondansetron (ZOFRAN) IV, ondansetron  Assessment/Plan:  Active Problems:  UTI (lower urinary tract infection) - pt clinically improving, will continue Levaquin as noted above - monitor strict I's and O's - follow up on urine cultures sensitivites  Generalized weakness - clinical improvement noted - PT evaluation   HTN (hypertension) - well controlled on Norvasc and Coreg - will continue to monitor vitals per floor protocol   Tobacco abuse - cessation consult provided - continue Nicotine patch   Severe protein-calorie malnutrition - secondary to progressive failure to thrive - nutrition consult   Hyponatremia - sodium trending down, this is likely multifactorial in nature and secondary to dehydration, failure to thrive, poor oral intake, ? Psychogenic polydipsia - review of record indicate that pt has had hyponatremia dating back to 2011 as low as 122 - pt appears clinically dehydrated - will continue IVF for now - continue sodium tablets as noted above - BMP in AM   Hypokalemia - supplemented adequately  - BMP in AM   Schizophrenia - stable  - continue  Risperdal   Depression - stable   EDUCATION - test results and diagnostic studies were discussed with patient  - patient verbalized the understanding - questions were answered at the bedside and contact information was provided for additional questions or concerns   LOS: 3 days   MAGICK-Makai Agostinelli 02/19/2012, 2:41 PM  [facility] Pager: [phone]

## 2012-02-19 NOTE — Progress Notes (Signed)
Physical Therapy Treatment Patient Details Name: Alan [name] MRN: [id number] DOB: 1946-01-15 Today's Date: 02/19/2012 Time: 4098-1191 PT Time Calculation (min): 35 min 1 gt, 1ta  PT Assessment / Plan / Recommendation Comments on Treatment Session  Pt able to ambulate 25 ft total with one resting break @ 10 ft.  Pt c/o leg pain when trying to ambulate.  Pt used RW +2 assist pt=60%on RA. VC and manual assist needed 100% for gait pattern, distance from RW, and trying to educate pt to use LLE and straighten LLE.  Pt very fidgity, left in chair with bed rails up on R side, table across front of pt.  Extra pillows were placed in chair for extra cushion/support:pt rated pain as "critical on bottom". Nsg notified pt left on RA , O2 sats 96%, and pt requesting meds for pain.    Follow Up Recommendations  Skilled nursing facility    Equipment Recommendations  Defer to next venue    Frequency     Plan Discharge plan remains appropriate    Precautions / Restrictions Restrictions Weight Bearing Restrictions: No   Pertinent Vitals/Pain Pt c/o buttock pain when in a seated position    Mobility  Bed Mobility Bed Mobility: Supine to Sit;Sitting - Scoot to Edge of Bed Supine to Sit: 4: Min assist (with LEs, LLE>RLE) Sitting - Scoot to Edge of Bed: 4: Min assist Details for Bed Mobility Assistance: pt required assistance with LLE, stating it hurt, that he had pain in legs but would not rate pain.  Pt required assistance to scoot to edge of bed.  Transfers Transfers: Sit to Stand;Stand to Sit Sit to Stand: 3: Mod assist;From elevated surface;From bed;From chair/3-in-1;With armrests Sit to Stand: Patient Percentage: 50% Stand to Sit: 4: Min assist Stand to Sit: Patient Percentage: 60% Details for Transfer Assistance: pt required VC for UE placement and use for push off for standing.  Pt required assistance with UE to sit  Ambulation/Gait Ambulation/Gait Assistance: 1: +2 Total  assist Ambulation/Gait: Patient Percentage: 60 Ambulation Distance (Feet): 25 Feet Assistive device: Rolling walker Ambulation/Gait Assistance Details: pt required VC 100% for gait pattern, distance from RW, to straighten LLE, was resistant to bear weight on LLE, and c/o pain in LEs Gait Pattern: Step-to pattern;Decreased step length - left;Decreased stance time - left;Decreased weight shift to left;Left flexed knee in stance;Trunk flexed Gait velocity: decreased General Gait Details: Pt unwilling to bear weight on LLE, constant VC were required for gait pattern and distance from RW.  Manual assistance used to get pt to straighten LLE and try to bear weight. Pt ambulated a total of 25 ft with one resting/sitting break after 10 ft. Stairs: No Corporate treasurer: No        PT Goals Acute Rehab PT Goals PT Goal Formulation: With patient Pt will go Supine/Side to Sit: with supervision PT Goal: Supine/Side to Sit - Progress: Progressing toward goal Pt will go Sit to Stand: with min assist PT Goal: Sit to Stand - Progress: Progressing toward goal Pt will go Stand to Sit: with min assist PT Goal: Stand to Sit - Progress: Progressing toward goal Pt will Ambulate: 16 - 50 feet;with rolling walker;with min assist PT Goal: Ambulate - Progress: Progressing toward goal  Visit Information  Last PT Received On: 02/19/12 Assistance Needed: +2    Subjective Data  Subjective: Pt c/o pain in legs, and pain in buttocks, calling it "critical" pain.  Pt did not rate pain.  End of Session PT - End of Session Equipment Utilized During Treatment: Gait belt Activity Tolerance: Patient limited by pain Patient left: in chair;with call bell/phone within reach Nurse Communication: Mobility status;Other (comment) (O2 sats 96% on RA; left on RA and requesting pain meds)    Goodwill, Denice, SPTA 02/19/2012, 12:13 PM  Felecia Shelling  PTA [facility]  Acute  Rehab Pager     [phone]

## 2012-02-20 DIAGNOSIS — E876 Hypokalemia: Secondary | ICD-10-CM

## 2012-02-20 DIAGNOSIS — R627 Adult failure to thrive: Secondary | ICD-10-CM

## 2012-02-20 DIAGNOSIS — E860 Dehydration: Secondary | ICD-10-CM

## 2012-02-20 DIAGNOSIS — E871 Hypo-osmolality and hyponatremia: Secondary | ICD-10-CM

## 2012-02-20 LAB — BASIC METABOLIC PANEL
CO2: 26 mEq/L (ref 19–32)
Calcium: 8 mg/dL — ABNORMAL LOW (ref 8.4–10.5)
Chloride: 91 mEq/L — ABNORMAL LOW (ref 96–112)
GFR calc Af Amer: >90 mL/min (ref >90)
GFR calc non Af Amer: >90 mL/min (ref >90)
Potassium: 3.8 mEq/L (ref 3.5–5.1)
Potassium: 4 mEq/L (ref 3.5–5.1)
Sodium: 123 mEq/L — ABNORMAL LOW (ref 135–145)
Sodium: 125 mEq/L — ABNORMAL LOW (ref 135–145)

## 2012-02-20 LAB — CBC
MCHC: 35.3 g/dL (ref 30.0–36.0)
Platelets: 249 10*3/uL (ref 150–400)
RBC: 4.14 MIL/uL — ABNORMAL LOW (ref 4.22–5.81)
RDW: 12.4 % (ref 11.5–15.5)
WBC: 11.2 10*3/uL — ABNORMAL HIGH (ref 4.0–10.5)
WBC: 11.1 10*3/uL — ABNORMAL HIGH (ref 4.0–10.5)

## 2012-02-20 LAB — PROCALCITONIN: Procalcitonin: 0.24 ng/mL

## 2012-02-20 MED ORDER — TIOTROPIUM BROMIDE MONOHYDRATE 18 MCG IN CAPS: 18.0000 ug | ORAL_CAPSULE | Freq: Every day | RESPIRATORY_TRACT | Status: AC

## 2012-02-20 MED ORDER — LEVOFLOXACIN 500 MG PO TABS: 500.0000 mg | ORAL_TABLET | ORAL | Status: AC

## 2012-02-20 MED ORDER — CARVEDILOL 25 MG PO TABS: 25.0000 mg | ORAL_TABLET | Freq: Two times a day (BID) | ORAL | Status: DC

## 2012-02-20 MED ORDER — BENZTROPINE MESYLATE 0.5 MG PO TABS: 0.5000 mg | ORAL_TABLET | Freq: Two times a day (BID) | ORAL | Status: DC

## 2012-02-20 MED ORDER — SODIUM CHLORIDE 0.9 % IV BOLUS (SEPSIS): 500.0000 mL | Freq: Once | INTRAVENOUS | Status: DC

## 2012-02-20 MED ORDER — SODIUM CHLORIDE 0.9 % IV BOLUS (SEPSIS)
500.0000 mL | Freq: Once | INTRAVENOUS | Status: AC
  Administered 2012-02-20: 500 mL via INTRAVENOUS

## 2012-02-20 NOTE — Progress Notes (Signed)
Patient set to discharge to [facility] today. [facility] will be called for transport once patient's brother, Fredrik Cove arrives to facility to complete admission paperwork. Patient aware.   Unice Bailey, [place] [phone]  Clinical Social Work Department CLINICAL SOCIAL WORK PLACEMENT NOTE 02/20/2012  Patient:  Alan [name], Alan [name]  Account Number:  [id number] Admit date:  02/16/2012  Clinical Social Worker:  Orpah Greek  Date/time:  02/18/2012 01:49 PM  Clinical Social Work is seeking post-discharge placement for this patient at the following level of care:   SKILLED NURSING   (*CSW will update this form in Epic as items are completed)   02/18/2012  Patient/family provided with [facility] Department of Clinical Social Work's list of facilities offering this level of care within the geographic area requested by the patient (or if unable, by the patient's family).  02/18/2012  Patient/family informed of their freedom to choose among providers that offer the needed level of care, that participate in Medicare, Medicaid or managed care program needed by the patient, have an available bed and are willing to accept the patient.  02/18/2012  Patient/family informed of [facility]' ownership interest in [facility], as well as of the fact that they are under no obligation to receive care at this facility.  PASARR submitted to EDS on 02/18/2012 PASARR number received from EDS on 02/18/2012  FL2 transmitted to all facilities in geographic area requested by pt/family on  02/18/2012 FL2 transmitted to all facilities within larger geographic area on   Patient informed that his/her managed care company has contracts with or will negotiate with  certain facilities, including the following:     Patient/family informed of bed offers received:  02/18/2012 Patient chooses bed at [place], [facility] Physician recommends and patient chooses bed at    Patient to be transferred to  [place], [facility] on  02/20/2012 Patient to be transferred to facility by [facility]  The following physician request were entered in Epic:   Additional Comments:   Unice Bailey, LCSWA [phone]

## 2012-02-20 NOTE — Discharge Summary (Signed)
Patient ID: Alan [name] MRN: [id number] DOB/AGE: 68-Aug-1947 68 y.o.  Admit date: 02/16/2012 Discharge date: 02/20/2012  Primary Care Physician:  No primary provider on file.  Discharge Diagnoses:  Altered mental status secondary to UTI, progressive failure to thrive  Present on Admission:  .UTI (lower urinary tract infection) .Generalized weakness .HTN (hypertension) .Cough .Tobacco abuse .Severe protein-calorie malnutrition .Hyponatremia .Hypokalemia .Dehydration with hyponatremia .Schizophrenia .Depression  Active Problems:  UTI (lower urinary tract infection)  Generalized weakness  HTN (hypertension)  Cough  Tobacco abuse  Severe protein-calorie malnutrition  Hyponatremia  Hypokalemia  Dehydration with hyponatremia  Schizophrenia  Depression   Medication List  As of 02/20/2012  2:23 PM   STOP taking these medications         hydrochlorothiazide 12.5 MG tablet      metoprolol succinate 25 MG 24 hr tablet         TAKE these medications         acetaminophen 500 MG tablet   Commonly known as: TYLENOL   Take 1,000 mg by mouth every 6 (six) hours as needed. For pain.      amLODipine 5 MG tablet   Commonly known as: NORVASC   Take 5 mg by mouth every morning.      aspirin EC 325 MG tablet   Take 325 mg by mouth daily.      benztropine 0.5 MG tablet   Commonly known as: COGENTIN   Take 1 tablet (0.5 mg total) by mouth 2 (two) times daily.      carvedilol 25 MG tablet   Commonly known as: COREG   Take 1 tablet (25 mg total) by mouth 2 (two) times daily with a meal.      levofloxacin 500 MG tablet   Commonly known as: LEVAQUIN   Take 1 tablet (500 mg total) by mouth daily.      mulitivitamin with minerals Tabs   Take 1 tablet by mouth daily.      risperiDONE 1 MG tablet   Commonly known as: RISPERDAL   Take 1 mg by mouth daily.      tiotropium 18 MCG inhalation capsule   Commonly known as: SPIRIVA   Place 1 capsule (18 mcg total) into inhaler and  inhale daily.      Vitamin D3 1000 UNITS Caps   Take 1 capsule by mouth every morning.      vitamin E 400 UNIT capsule   Take 400 Units by mouth every morning.            Disposition and Follow-up:  Pt will need to see PCP at the facility within 1 week. Please check BMP to ensure that sodium level is trending in the right direction. Please note that pt has chronic hyponatremia dating back 2010 and 2011 with baseline level 122 - 125 as per review of records. Please also note that we have held Metoprolol and HCTZ given pt's soft BP and hyponatremia, respectively. This can be restarted depending if BP tolerated and hyponatremia improves. Pt will also need to complete therapy with Levofloxacin for 3 additional days post discharge.   Consults:  none  Significant Diagnostic Studies:   Dg Abd Acute W/chest 02/16/2012    IMPRESSION:   1.  Nonspecific bowel gas pattern without evidence for high-grade obstruction.  2.  Chronic bronchitic changes.    Lumbar XRAY:  02/16/2012 IMPRESSION:  Normal alignment. Normal disc spaces. Question ileus.  Brief H and P: 66 y/o male with pmh of HTN,  schizophrenia and chronic hx of left femur fracture; came into the ED secondary to generalized weakness, failure to thrive, cough and smelly urine as per family members; he is also not able to do much at home and at this point pretty much lying on bed all day. He denied any CP, SOB, fever, diarrhea, nausea or vomiting. Patient reported some abdominal discomfort and also low back pain and legs cramping. In the ED he was found to have hyponatremia, hypokalemia and also UA suggesting of UTI.  Physical Exam on Discharge:   02/19/12 1700 02/19/12 2311 02/20/12 0433  BP: 130/69 144/69 144/67  Pulse: 80 77 70  Temp:  98.8 F (37.1 C) 99.2 F (37.3 C)  SpO2:  97% 97%    Intake/Output Summary (Last 24 hours) at 02/20/12 1423 Last data filed at 02/20/12 6578  Gross per 24 hour  Intake   1293 ml  Output   1050  ml  Net    243 ml    General: Alert, awake, oriented to name and place, in no acute distress. Follows commands appropriately. Cachectic and frail looking.  HEENT: No bruits, no goiter. Heart: Regular rate and rhythm, without murmurs, rubs, gallops. Lungs: Clear to auscultation bilaterally, no wheezing, slightly decreased sounds at bases. Abdomen: Soft, nontender, nondistended, positive bowel sounds. Extremities: No clubbing cyanosis or edema with positive pedal pulses. Neuro: Grossly intact, nonfocal.  Lab Results:  Lab 02/20/12 1320 02/20/12 0458 02/17/12 0521 02/16/12 1215  WBC 11.1* 11.2* 6.9 11.2*  HGB 12.5* 13.0 13.6 14.1  HCT 34.9* 36.8* 36.9* 38.2*  PLT 249 262 263 259    Lab 02/20/12 1320 02/20/12 0458 02/19/12 0435 02/18/12 0514 02/17/12 0521  NA 125* 123* 123* 126* 127*  K 4.0 3.8 4.2 4.5 4.3  CL 91* 89* 88* 90* 90*  CO2 26 26 27 25 29   GLUCOSE 173* 115* 114* 101* 95  BUN 12 14 15 7 8   CREATININE 0.43* 0.56 0.58 0.48* 0.52  CALCIUM 8.0* 8.2* 8.2* 8.9 8.5    Recent Results (from the past 240 hour(s))  URINE CULTURE     Status: Normal   Collection Time   02/16/12 12:23 PM      Component Value Range Status Comment   Specimen Description URINE, CATHETERIZED   Final    Special Requests NONE   Final    Culture  Setup Time [id number]   Final    Colony Count >=100,000 COLONIES/ML   Final    Culture PSEUDOMONAS AERUGINOSA   Final    Report Status 02/19/2012 FINAL   Final    Organism ID, Bacteria PSEUDOMONAS AERUGINOSA   Final    Hospital Course:   Active Problems:  UTI (lower urinary tract infection)  - pt clinically improving - will continue Levaquin as noted above but please note that pt will need total of 3 more days of antibiotic upon discharge - this will complete 7 days therapy   Generalized weakness  - clinical improvement noted, this is most likely multifactorial in nature secondary to progressive failure to thrive and malnutrition - PT evaluation done  and recommendation was to continue PT in SNF  HTN (hypertension)  - well controlled on Norvasc and Coreg  - please note that Metoprolol and HCTZ were held during the hospitalization and can be restarted if needed if BP permits  Tobacco abuse  - cessation consult provided   Severe protein-calorie malnutrition  - secondary to progressive failure to thrive  - nutrition consult obtained  Hyponatremia  -  sodium trending up, this is likely multifactorial in nature and secondary to dehydration, failure to thrive, poor oral intake, ? Psychogenic polydipsia  - review of record indicate that pt has had hyponatremia dating back to 2011 as low as 122  - pt has responded well to IVF, PO intake was encouraged - BMP to be repeated in outpatient setting in 1 week as noted above  Hypokalemia  - supplemented adequately   Schizophrenia  - stable  - continue Risperdal   Depression  - stable   EDUCATION  - test results and diagnostic studies were discussed with patient  - patient verbalized the understanding  - questions were answered at the bedside and contact information was provided for additional questions or concerns   Time spent on Discharge: Over 30 minutes  Signed: Debbora Presto 02/20/2012, 2:23 PM  [facility], pager #: [phone] Main office number: [phone]

## 2012-02-20 NOTE — Discharge Instructions (Signed)
Hyponatremia   Hyponatremia is when the amount of salt (sodium) in your blood is too low. When sodium levels are low, your cells will absorb extra water and swell. The swelling happens throughout the body, but it mostly affects the brain. Severe brain swelling (cerebral edema), seizures, or coma can happen.   CAUSES   Heart, kidney, or liver problems.   Thyroid problems.   Adrenal gland problems.   Severe vomiting and diarrhea.   Certain medicines or illegal drugs.   Dehydration.   Drinking too much water.   Low-sodium diet.   SYMPTOMS   Nausea and vomiting.   Confusion.   Lethargy.   Agitation.   Headache.   Twitching or shaking (seizures).   Unconsciousness.   Appetite loss.   Muscle weakness and cramping.   DIAGNOSIS   Hyponatremia is identified by a simple blood test. Your caregiver will perform a history and physical exam to try to find the cause and type of hyponatremia. Other tests may be needed to measure the amount of sodium in your blood and urine.   TREATMENT   Treatment will depend on the cause.   Fluids may be given through the vein (IV).   Medicines may be used to correct the sodium imbalance. If medicines are causing the problem, they will need to be adjusted.   Water or fluid intake may be restricted to restore proper balance.   The speed of correcting the sodium problem is very important. If the problem is corrected too fast, nerve damage (sometimes unchangeable) can happen.   HOME CARE INSTRUCTIONS   Only take medicines as directed by your caregiver. Many medicines can make hyponatremia worse. Discuss all your medicines with your caregiver.   Carefully follow any recommended diet, including any fluid restrictions.   You may be asked to repeat lab tests. Follow these directions.   Avoid alcohol and recreational drugs.   SEEK MEDICAL CARE IF:   You develop worsening nausea, fatigue, headache, confusion, or weakness.   Your original hyponatremia symptoms return.   You have problems following the  recommended diet.   SEEK IMMEDIATE MEDICAL CARE IF:   You have a seizure.   You faint.   You have ongoing diarrhea or vomiting.   MAKE SURE YOU:   Understand these instructions.   Will watch your condition.   Will get help right away if you are not doing well or get worse.   Document Released: 09/27/2002 Document Revised: 09/26/2011 Document Reviewed: 03/24/2011   [employer]® Patient Information ©2012 [employer].

## 2012-06-10 ENCOUNTER — Other Ambulatory Visit (HOSPITAL_COMMUNITY): Payer: Self-pay | Admitting: Internal Medicine

## 2012-06-14 ENCOUNTER — Other Ambulatory Visit (HOSPITAL_COMMUNITY): Payer: Self-pay | Admitting: Internal Medicine

## 2012-06-17 ENCOUNTER — Other Ambulatory Visit (HOSPITAL_COMMUNITY): Payer: Self-pay | Admitting: Internal Medicine

## 2012-06-17 NOTE — Telephone Encounter (Signed)
Not IM pt 

## 2019-07-08 ENCOUNTER — Other Ambulatory Visit: Payer: Self-pay

## 2019-07-08 ENCOUNTER — Other Ambulatory Visit: Payer: Self-pay | Admitting: Physician Assistant

## 2019-07-08 ENCOUNTER — Other Ambulatory Visit (HOSPITAL_COMMUNITY): Payer: Self-pay | Admitting: Physician Assistant

## 2019-07-08 ENCOUNTER — Ambulatory Visit (HOSPITAL_COMMUNITY)
Admission: RE | Admit: 2019-07-08 | Discharge: 2019-07-08 | Disposition: A | Discharge status: Home / Self Care | Payer: Medicare Other | Source: Ambulatory Visit | Attending: Physician Assistant | Admitting: Physician Assistant

## 2019-07-08 DIAGNOSIS — R0989 Other specified symptoms and signs involving the circulatory and respiratory systems: Secondary | ICD-10-CM | POA: Insufficient documentation

## 2020-05-06 ENCOUNTER — Emergency Department (HOSPITAL_COMMUNITY): Payer: Medicare Other

## 2020-05-06 ENCOUNTER — Inpatient Hospital Stay (HOSPITAL_COMMUNITY): Payer: Medicare Other

## 2020-05-06 ENCOUNTER — Encounter (HOSPITAL_COMMUNITY): Payer: Self-pay

## 2020-05-06 ENCOUNTER — Inpatient Hospital Stay (HOSPITAL_COMMUNITY)
Admission: EM | Admit: 2020-05-06 | Discharge: 2020-05-12 | DRG: 557 | Disposition: A | Discharge status: Skilled Nursing Facility | Payer: Medicare Other | Attending: Family Medicine | Admitting: Family Medicine

## 2020-05-06 DIAGNOSIS — E871 Hypo-osmolality and hyponatremia: Secondary | ICD-10-CM | POA: Diagnosis present

## 2020-05-06 DIAGNOSIS — R52 Pain, unspecified: Secondary | ICD-10-CM

## 2020-05-06 DIAGNOSIS — E860 Dehydration: Secondary | ICD-10-CM | POA: Diagnosis present

## 2020-05-06 DIAGNOSIS — B965 Pseudomonas (aeruginosa) (mallei) (pseudomallei) as the cause of diseases classified elsewhere: Secondary | ICD-10-CM | POA: Diagnosis present

## 2020-05-06 DIAGNOSIS — E872 Acidosis: Secondary | ICD-10-CM | POA: Diagnosis present

## 2020-05-06 DIAGNOSIS — Z8673 Personal history of transient ischemic attack (TIA), and cerebral infarction without residual deficits: Secondary | ICD-10-CM

## 2020-05-06 DIAGNOSIS — E43 Unspecified severe protein-calorie malnutrition: Secondary | ICD-10-CM | POA: Diagnosis present

## 2020-05-06 DIAGNOSIS — Z20822 Contact with and (suspected) exposure to covid-19: Secondary | ICD-10-CM | POA: Diagnosis present

## 2020-05-06 DIAGNOSIS — Y92019 Unspecified place in single-family (private) house as the place of occurrence of the external cause: Secondary | ICD-10-CM | POA: Diagnosis not present

## 2020-05-06 DIAGNOSIS — Z7982 Long term (current) use of aspirin: Secondary | ICD-10-CM

## 2020-05-06 DIAGNOSIS — S42212A Unspecified displaced fracture of surgical neck of left humerus, initial encounter for closed fracture: Secondary | ICD-10-CM | POA: Diagnosis present

## 2020-05-06 DIAGNOSIS — M25552 Pain in left hip: Secondary | ICD-10-CM

## 2020-05-06 DIAGNOSIS — I10 Essential (primary) hypertension: Secondary | ICD-10-CM | POA: Diagnosis present

## 2020-05-06 DIAGNOSIS — Z79899 Other long term (current) drug therapy: Secondary | ICD-10-CM | POA: Diagnosis not present

## 2020-05-06 DIAGNOSIS — R4182 Altered mental status, unspecified: Secondary | ICD-10-CM | POA: Diagnosis not present

## 2020-05-06 DIAGNOSIS — H7190 Unspecified cholesteatoma, unspecified ear: Secondary | ICD-10-CM

## 2020-05-06 DIAGNOSIS — Z6821 Body mass index (BMI) 21.0-21.9, adult: Secondary | ICD-10-CM

## 2020-05-06 DIAGNOSIS — R748 Abnormal levels of other serum enzymes: Secondary | ICD-10-CM | POA: Diagnosis not present

## 2020-05-06 DIAGNOSIS — L8990 Pressure ulcer of unspecified site, unspecified stage: Secondary | ICD-10-CM | POA: Insufficient documentation

## 2020-05-06 DIAGNOSIS — F209 Schizophrenia, unspecified: Secondary | ICD-10-CM | POA: Diagnosis present

## 2020-05-06 DIAGNOSIS — M6282 Rhabdomyolysis: Principal | ICD-10-CM | POA: Diagnosis present

## 2020-05-06 DIAGNOSIS — N390 Urinary tract infection, site not specified: Secondary | ICD-10-CM | POA: Diagnosis present

## 2020-05-06 DIAGNOSIS — W19XXXA Unspecified fall, initial encounter: Secondary | ICD-10-CM | POA: Diagnosis present

## 2020-05-06 DIAGNOSIS — G9341 Metabolic encephalopathy: Secondary | ICD-10-CM | POA: Diagnosis present

## 2020-05-06 DIAGNOSIS — I350 Nonrheumatic aortic (valve) stenosis: Secondary | ICD-10-CM | POA: Diagnosis present

## 2020-05-06 DIAGNOSIS — E876 Hypokalemia: Secondary | ICD-10-CM | POA: Diagnosis present

## 2020-05-06 DIAGNOSIS — Z85820 Personal history of malignant melanoma of skin: Secondary | ICD-10-CM

## 2020-05-06 DIAGNOSIS — H7191 Unspecified cholesteatoma, right ear: Secondary | ICD-10-CM | POA: Diagnosis present

## 2020-05-06 DIAGNOSIS — M25551 Pain in right hip: Secondary | ICD-10-CM

## 2020-05-06 DIAGNOSIS — L89152 Pressure ulcer of sacral region, stage 2: Secondary | ICD-10-CM | POA: Diagnosis present

## 2020-05-06 DIAGNOSIS — F17210 Nicotine dependence, cigarettes, uncomplicated: Secondary | ICD-10-CM | POA: Diagnosis present

## 2020-05-06 DIAGNOSIS — S42309A Unspecified fracture of shaft of humerus, unspecified arm, initial encounter for closed fracture: Secondary | ICD-10-CM

## 2020-05-06 DIAGNOSIS — F4310 Post-traumatic stress disorder, unspecified: Secondary | ICD-10-CM | POA: Diagnosis present

## 2020-05-06 DIAGNOSIS — I361 Nonrheumatic tricuspid (valve) insufficiency: Secondary | ICD-10-CM | POA: Diagnosis not present

## 2020-05-06 LAB — CBC WITH DIFFERENTIAL/PLATELET
Abs Immature Granulocytes: 0.12 10*3/uL — ABNORMAL HIGH (ref 0.00–0.07)
Basophils Absolute: 0 10*3/uL (ref 0.0–0.1)
Basophils Relative: 0 %
Eosinophils Absolute: 0 10*3/uL (ref 0.0–0.5)
Eosinophils Relative: 0 %
HCT: 39.5 % (ref 39.0–52.0)
Hemoglobin: 13.8 g/dL (ref 13.0–17.0)
Immature Granulocytes: 1 %
Lymphocytes Relative: 2 %
Lymphs Abs: 0.4 10*3/uL — ABNORMAL LOW (ref 0.7–4.0)
MCH: 29.7 pg (ref 26.0–34.0)
MCHC: 34.9 g/dL (ref 30.0–36.0)
MCV: 85.1 fL (ref 80.0–100.0)
Monocytes Absolute: 1 10*3/uL (ref 0.1–1.0)
Monocytes Relative: 5 %
Neutro Abs: 16.5 10*3/uL — ABNORMAL HIGH (ref 1.7–7.7)
Neutrophils Relative %: 92 %
Platelets: 229 10*3/uL (ref 150–400)
RBC: 4.64 MIL/uL (ref 4.22–5.81)
RDW: 12.7 % (ref 11.5–15.5)
WBC: 18.1 10*3/uL — ABNORMAL HIGH (ref 4.0–10.5)
nRBC: 0 % (ref 0.0–0.2)

## 2020-05-06 LAB — URINALYSIS, COMPLETE (UACMP) WITH MICROSCOPIC
Bacteria, UA: NONE SEEN
Bilirubin Urine: NEGATIVE
Glucose, UA: 50 mg/dL — AB
Ketones, ur: 20 mg/dL — AB
Leukocytes,Ua: NEGATIVE
Nitrite: POSITIVE — AB
Protein, ur: 100 mg/dL — AB
Specific Gravity, Urine: 1.017 (ref 1.005–1.030)
pH: 7 (ref 5.0–8.0)

## 2020-05-06 LAB — AMMONIA: Ammonia: 20 umol/L (ref 9–35)

## 2020-05-06 LAB — COMPREHENSIVE METABOLIC PANEL
ALT: 31 U/L (ref 0–44)
AST: 59 U/L — ABNORMAL HIGH (ref 15–41)
Albumin: 3.7 g/dL (ref 3.5–5.0)
Alkaline Phosphatase: 65 U/L (ref 38–126)
Anion gap: 15 (ref 5–15)
BUN: 22 mg/dL (ref 8–23)
CO2: 23 mmol/L (ref 22–32)
Calcium: 8.8 mg/dL — ABNORMAL LOW (ref 8.9–10.3)
Chloride: 90 mmol/L — ABNORMAL LOW (ref 98–111)
Creatinine, Ser: 0.63 mg/dL (ref 0.61–1.24)
GFR calc Af Amer: >60 mL/min (ref >60)
GFR calc non Af Amer: >60 mL/min (ref >60)
Glucose, Bld: 129 mg/dL — ABNORMAL HIGH (ref 70–99)
Potassium: 3.2 mmol/L — ABNORMAL LOW (ref 3.5–5.1)
Sodium: 128 mmol/L — ABNORMAL LOW (ref 135–145)
Total Bilirubin: 1.5 mg/dL — ABNORMAL HIGH (ref 0.3–1.2)
Total Protein: 6.7 g/dL (ref 6.5–8.1)

## 2020-05-06 LAB — RAPID URINE DRUG SCREEN, HOSP PERFORMED
Amphetamines: NOT DETECTED
Barbiturates: NOT DETECTED
Benzodiazepines: NOT DETECTED
Cocaine: NOT DETECTED
Opiates: NOT DETECTED
Tetrahydrocannabinol: NOT DETECTED

## 2020-05-06 LAB — CK: Total CK: 2126 U/L — ABNORMAL HIGH (ref 49–397)

## 2020-05-06 LAB — SARS CORONAVIRUS 2 BY RT PCR (HOSPITAL ORDER, PERFORMED IN ~~LOC~~ HOSPITAL LAB): SARS Coronavirus 2: NEGATIVE

## 2020-05-06 LAB — ETHANOL: Alcohol, Ethyl (B): <10 mg/dL (ref <10)

## 2020-05-06 LAB — LACTIC ACID, PLASMA: Lactic Acid, Venous: 2.3 mmol/L (ref 0.5–1.9)

## 2020-05-06 LAB — PROTIME-INR
INR: 1.1 (ref 0.8–1.2)
Prothrombin Time: 13.8 seconds (ref 11.4–15.2)

## 2020-05-06 MED ORDER — ACETAMINOPHEN 650 MG RE SUPP: 650.0000 mg | Freq: Four times a day (QID) | RECTAL | Status: DC | PRN

## 2020-05-06 MED ORDER — SODIUM CHLORIDE 0.9 % IV SOLN
2.0000 g | Freq: Once | INTRAVENOUS | Status: AC
  Administered 2020-05-06: 2 g via INTRAVENOUS
  Filled 2020-05-06: qty 20

## 2020-05-06 MED ORDER — SODIUM CHLORIDE 0.9 % IV BOLUS
1250.0000 mL | Freq: Once | INTRAVENOUS | Status: AC
  Administered 2020-05-06: 1250 mL via INTRAVENOUS

## 2020-05-06 MED ORDER — ACETAMINOPHEN 325 MG PO TABS
650.0000 mg | ORAL_TABLET | Freq: Four times a day (QID) | ORAL | Status: DC | PRN
  Administered 2020-05-08 (×2): 650 mg via ORAL
  Filled 2020-05-06 (×2): qty 2

## 2020-05-06 MED ORDER — CARVEDILOL 12.5 MG PO TABS
12.5000 mg | ORAL_TABLET | Freq: Two times a day (BID) | ORAL | Status: DC
  Administered 2020-05-07 – 2020-05-12 (×12): 12.5 mg via ORAL
  Filled 2020-05-06 (×12): qty 1

## 2020-05-06 MED ORDER — ASPIRIN EC 325 MG PO TBEC
325.0000 mg | DELAYED_RELEASE_TABLET | Freq: Every day | ORAL | Status: DC
  Administered 2020-05-06 – 2020-05-12 (×7): 325 mg via ORAL
  Filled 2020-05-06 (×7): qty 1

## 2020-05-06 MED ORDER — RISPERIDONE 1 MG PO TABS
1.0000 mg | ORAL_TABLET | Freq: Every day | ORAL | Status: DC
  Administered 2020-05-06 – 2020-05-12 (×7): 1 mg via ORAL
  Filled 2020-05-06 (×8): qty 1

## 2020-05-06 MED ORDER — AMLODIPINE BESYLATE 5 MG PO TABS
5.0000 mg | ORAL_TABLET | Freq: Every morning | ORAL | Status: DC
  Administered 2020-05-06 – 2020-05-12 (×7): 5 mg via ORAL
  Filled 2020-05-06 (×7): qty 1

## 2020-05-06 MED ORDER — SODIUM CHLORIDE 0.9 % IV SOLN
1.0000 g | INTRAVENOUS | Status: DC
  Administered 2020-05-07: 1 g via INTRAVENOUS
  Filled 2020-05-06: qty 1

## 2020-05-06 MED ORDER — SODIUM CHLORIDE 0.9 % IV SOLN: INTRAVENOUS | Status: AC

## 2020-05-06 MED ORDER — LORAZEPAM 0.5 MG PO TABS
0.5000 mg | ORAL_TABLET | Freq: Four times a day (QID) | ORAL | Status: DC | PRN
  Administered 2020-05-08: 0.5 mg via ORAL
  Filled 2020-05-06: qty 1

## 2020-05-06 MED ORDER — CARVEDILOL 12.5 MG PO TABS: 25.0000 mg | ORAL_TABLET | Freq: Every day | ORAL | Status: DC

## 2020-05-06 MED ORDER — ENOXAPARIN SODIUM 40 MG/0.4ML ~~LOC~~ SOLN
40.0000 mg | SUBCUTANEOUS | Status: DC
  Administered 2020-05-06 – 2020-05-12 (×7): 40 mg via SUBCUTANEOUS
  Filled 2020-05-06 (×7): qty 0.4

## 2020-05-06 MED ORDER — POTASSIUM CHLORIDE CRYS ER 20 MEQ PO TBCR
40.0000 meq | EXTENDED_RELEASE_TABLET | Freq: Once | ORAL | Status: AC
  Administered 2020-05-06: 40 meq via ORAL
  Filled 2020-05-06: qty 2

## 2020-05-06 MED ORDER — VITAMIN E 180 MG (400 UNIT) PO CAPS
400.0000 [IU] | ORAL_CAPSULE | Freq: Every morning | ORAL | Status: DC
  Administered 2020-05-06 – 2020-05-12 (×7): 400 [IU] via ORAL
  Filled 2020-05-06 (×7): qty 1

## 2020-05-06 NOTE — ED Notes (Signed)
Per family patient has periods of confusion. Per family patient has been declining in health.

## 2020-05-06 NOTE — H&P (Signed)
History and Physical    Alan [name] GQB:[id number] DOB: 1946/02/15 DOA: 05/06/2020  PCP: Barbie Banner, MD (Confirm with patient/family/NH records and if not entered, this has to be entered at [facility] point of entry) Patient coming from: Home  I have personally briefly reviewed patient's old medical records in [facility]  Chief Complaint: I fell.  HPI: Alan [name] is a 74 y.o. male with medical history significant of HTN,  PTSD, schizophrenia, presented with altered mental status and fall.  Patient is to very confused in the ED, most history provided by patient niece at bedside.  Patient [place], niece living around neighbor who visit him 1-2 times a week.  Niece visited him this morning found him on the floor, lying down, awake and confused.  Patient reported that he fell down on Monday, but he cannot tell what days today.  As per niece, baseline patient has ambulation dysfunction use a walker for last for 5 years after he broke his leg.  Niece just reported patient seems confused than his baseline.  Niece does suspect patient has another UTI which he reported patient had similar mental status changes when he had a UTI about 2 years ago.  Patient denied any abdominal pain no dysuria, and niece does not know whether patient had BPH before.  Patient remembered he felt generalized weakness before he fell down and he was not able to push up himself on the floor thus stayed on the floor before niece found him.  He does not think he lost his consciousness at any point, and he does not remember any prodrome such as feeling lightheaded or blurred vision before fell down.  He also denied any fever or chills, no chest pain or SOB, no headache. ED Course: UA showed positive nitrite, WBC 6-10/hpf.  WBC 18.1, potassium 3.2, CT heasd:Soft tissue lesion in the right mastoid antrum that is obstructing the right mastoid sinus and extending to the right middle ear ossicles. Findings are most  compatible with a cholesteatoma.  Review of Systems: As per HPI otherwise 10 point review of systems negative.    Past Medical History:  Diagnosis Date   Esophageal stricture    Hypertension    PTSD (post-traumatic stress disorder)    Stroke [facility])     Past Surgical History:  Procedure Laterality Date   MELANOMA EXCISION     ORIF FEMUR FRACTURE- LISS PLATE       reports that he has been smoking. He has been smoking about 1.50 packs per day. He has never used smokeless tobacco. He reports that he does not drink alcohol and does not use drugs.  No Known Allergies  History reviewed. No pertinent family history.   Prior to Admission medications   Medication Sig Start Date End Date Taking? Authorizing Provider  amLODipine (NORVASC) 5 MG tablet Take 5 mg by mouth every morning.   Yes [provider]  aspirin EC 325 MG tablet Take 325 mg by mouth daily.   Yes [provider]  carvedilol (COREG) 25 MG tablet Take 1 tablet (25 mg total) by mouth 2 (two) times daily with a meal. Patient taking differently: Take 25 mg by mouth daily.  02/20/12 05/06/20 Yes Dorothea Ogle, MD  hydrochlorothiazide (MICROZIDE) 12.5 MG capsule Take 12.5 mg by mouth daily. 04/12/20  Yes [provider]  risperiDONE (RISPERDAL) 1 MG tablet Take 1 mg by mouth daily.   Yes [provider]  vitamin E 400  UNIT capsule Take 400 Units by mouth every morning.   Yes [provider]  benztropine (COGENTIN) 0.5 MG tablet Take 1 tablet (0.5 mg total) by mouth 2 (two) times daily. Patient not taking: Reported on 05/06/2020 02/20/12 02/19/13  Dorothea Ogle, MD  tiotropium [facility]) 18 MCG inhalation capsule Place 1 capsule (18 mcg total) into inhaler and inhale daily. Patient not taking: Reported on 05/06/2020 02/20/12 02/19/13  Dorothea Ogle, MD    Physical Exam: Vitals:   05/06/20 1045 05/06/20 1054 05/06/20 1058 05/06/20 1113  BP: 130/65   (!) 144/86  Pulse: 92 82 94 71  Resp:  (!) 23 19 15  (!) 29  Temp:      TempSrc:      SpO2: 96% 99% 97% 97%    Constitutional: NAD, calm, comfortable Vitals:   05/06/20 1045 05/06/20 1054 05/06/20 1058 05/06/20 1113  BP: 130/65   (!) 144/86  Pulse: 92 82 94 71  Resp: (!) 23 19 15  (!) 29  Temp:      TempSrc:      SpO2: 96% 99% 97% 97%   Eyes: PERRL, lids and conjunctivae normal ENMT: Mucous membranes are dry. Posterior pharynx clear of any exudate or lesions.Normal dentition.  Neck: normal, supple, no masses, no thyromegaly Respiratory: clear to auscultation bilaterally, no wheezing, no crackles. Normal respiratory effort. No accessory muscle use.  Cardiovascular: Regular rate and rhythm, loud blowing like systolic murmurs on heart base, diminished S1 and S2/ rubs / gallops. No extremity edema. 2+ pedal pulses. No carotid bruits.  Abdomen: no tenderness, no masses palpated. No hepatosplenomegaly. Bowel sounds positive.  Musculoskeletal: no clubbing / cyanosis. No joint deformity upper and lower extremities. Good ROM, no contractures. Normal muscle tone.  Skin: no rashes, lesions, ulcers. No induration Neurologic: No tongue deviation or facial droop, strength 5/5 on right arm compared to 4/5 on the left.  Strength 5/5 symmetrical in bilateral lower extremities, light touch sensation intact all extremities Psychiatric: Oriented to himself confused about time and place    Labs on Admission: I have personally reviewed following labs and imaging studies  CBC: Recent Labs  Lab 05/06/20 1020  WBC 18.1*  NEUTROABS 16.5*  HGB 13.8  HCT 39.5  MCV 85.1  PLT 229   Basic Metabolic Panel: Recent Labs  Lab 05/06/20 1022  NA 128*  K 3.2*  CL 90*  CO2 23  GLUCOSE 129*  BUN 22  CREATININE 0.63  CALCIUM 8.8*   GFR: CrCl cannot be calculated (Unknown ideal weight.). Liver Function Tests: Recent Labs  Lab 05/06/20 1022  AST 59*  ALT 31  ALKPHOS 65  BILITOT 1.5*  PROT 6.7  ALBUMIN 3.7   No results for input(s):  LIPASE, AMYLASE in the last 168 hours. Recent Labs  Lab 05/06/20 1021  AMMONIA 20   Coagulation Profile: Recent Labs  Lab 05/06/20 1022  INR 1.1   Cardiac Enzymes: Recent Labs  Lab 05/06/20 1022  CKTOTAL 2,126*   BNP (last 3 results) No results for input(s): PROBNP in the last 8760 hours. HbA1C: No results for input(s): HGBA1C in the last 72 hours. CBG: No results for input(s): GLUCAP in the last 168 hours. Lipid Profile: No results for input(s): CHOL, HDL, LDLCALC, TRIG, CHOLHDL, LDLDIRECT in the last 72 hours. Thyroid Function Tests: No results for input(s): TSH, T4TOTAL, FREET4, T3FREE, THYROIDAB in the last 72 hours. Anemia Panel: No results for input(s): VITAMINB12, FOLATE, FERRITIN, TIBC, IRON, RETICCTPCT in the last 72 hours. Urine analysis:  Component Value Date/Time   COLORURINE YELLOW 05/06/2020 1114   APPEARANCEUR HAZY (A) 05/06/2020 1114   LABSPEC 1.017 05/06/2020 1114   PHURINE 7.0 05/06/2020 1114   GLUCOSEU 50 (A) 05/06/2020 1114   HGBUR MODERATE (A) 05/06/2020 1114   BILIRUBINUR NEGATIVE 05/06/2020 1114   KETONESUR 20 (A) 05/06/2020 1114   PROTEINUR 100 (A) 05/06/2020 1114   UROBILINOGEN 0.2 02/16/2012 1232   NITRITE POSITIVE (A) 05/06/2020 1114   LEUKOCYTESUR NEGATIVE 05/06/2020 1114    Radiological Exams on Admission: CT HEAD WO CONTRAST  Result Date: 05/06/2020 CLINICAL DATA:  Ataxia.  Head trauma. EXAM: CT HEAD WITHOUT CONTRAST TECHNIQUE: Contiguous axial images were obtained from the base of the skull through the vertex without intravenous contrast. COMPARISON:  None. FINDINGS: Brain: Mild cerebral atrophy. Encephalomalacia involving the medial right frontal lobe. Evidence for encephalomalacia in the posterior left parietal lobe. Scattered areas of periventricular low density. Negative for acute hemorrhage, mass lesion, midline shift, hydrocephalus or large new infarct. Vascular: No hyperdense vessel or unexpected calcification. Skull: Normal.  Negative for fracture or focal lesion. Sinuses/Orbits: Right mastoid sinus is opacified and there is a soft tissue lesion in the mastoid antrum obstructing the mastoid sinus and extending to the right middle ear ossicles. Findings are suggestive for a cholesteatoma. Visualized paranasal sinuses are unremarkable. Other: None. IMPRESSION: 1. Soft tissue lesion in the right mastoid antrum that is obstructing the right mastoid sinus and extending to the right middle ear ossicles. 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 2. Scattered areas of encephalomalacia and white matter disease in the brain. Findings are likely related to previous insults or infarcts. No acute intracranial abnormality. These results were called by telephone at the time of interpretation on 05/06/2020 at 12:08 pm to provider ABIGAIL HARRIS , who verbally acknowledged these results. Electronically Signed   By: Richarda OverlieAdam  Henn M.D.   On: 05/06/2020 12:09   CT Cervical Spine Wo Contrast  Result Date: 05/06/2020 CLINICAL DATA:  Fall. EXAM: CT CERVICAL SPINE WITHOUT CONTRAST TECHNIQUE: Multidetector CT imaging of the cervical spine was performed without intravenous contrast. Multiplanar CT image reconstructions were also generated. COMPARISON:  Head CT 05/06/2020 FINDINGS: Alignment: Normal. Skull base and vertebrae: No acute fracture. No primary bone lesion or focal pathologic process. Mild degenerative facet disease. Soft tissues and spinal canal: No prevertebral fluid or swelling. No visible canal hematoma. Disc levels: No significant disc space narrowing. Mild posterior disc space narrowing at C3-C4. Upper chest: Negative Other: Carotid artery calcifications. 9 mm low-density nodule in the right thyroid lobe. Visualized right mastoid sinus is opacified. IMPRESSION: No acute abnormality to the cervical spine. Electronically Signed   By: Richarda OverlieAdam  Henn M.D.   On: 05/06/2020 12:18   DG Chest Port 1 View  Result Date: 05/06/2020 CLINICAL DATA:   Status post fall today.  Initial encounter. EXAM: PORTABLE CHEST 1 VIEW COMPARISON:  PA and lateral chest 10/12/2010. FINDINGS: The lungs are emphysematous but clear. Aortic atherosclerosis. Heart size normal. No pneumothorax or pleural effusion. No acute bony abnormality. IMPRESSION: No acute disease. Aortic Atherosclerosis (ICD10-I70.0) and Emphysema (ICD10-J43.9). Electronically Signed   By: Drusilla Kannerhomas  Dalessio M.D.   On: 05/06/2020 11:11   DG HIP UNILAT WITH PELVIS 2-3 VIEWS LEFT  Result Date: 05/06/2020 CLINICAL DATA:  Left hip pain. EXAM: DG HIP (WITH OR WITHOUT PELVIS) 2-3V LEFT COMPARISON:  Pelvis 10/12/2010 FINDINGS: Pelvic bony ring is intact. Left hip is located without a fracture. No gross abnormality to the right hip. Large amount of stool  in the pelvis and right lower abdomen. Atherosclerotic calcifications. IMPRESSION: No acute bone abnormality in the pelvis or left hip. Large amount of stool in the abdomen and pelvis. Electronically Signed   By: Richarda Overlie M.D.   On: 05/06/2020 12:32   DG HIP UNILAT WITH PELVIS 2-3 VIEWS RIGHT  Result Date: 05/06/2020 CLINICAL DATA:  Fall. EXAM: DG HIP (WITH OR WITHOUT PELVIS) 2-3V RIGHT COMPARISON:  Left hip 05/06/2020 FINDINGS: Right hip is located without a fracture. Large amount of stool in the abdomen and pelvis. Visualized pelvic bony structures are intact. IMPRESSION: No acute bone abnormality to the right hip. Electronically Signed   By: Richarda Overlie M.D.   On: 05/06/2020 12:34    EKG: Independently reviewed. Poor quality, sinus arrhythmia versus A. fib.  Assessment/Plan Active Problems:   Rhabdomyolysis  (please populate well all problems here in Problem List. (For example, if patient is on BP meds at home and you resume or decide to hold them, it is a problem that needs to be her. Same for CAD, COPD, HLD and so on)  Metabolic encephalopathy -May from combination UTI and dehydration.  Antibiotic started, hydration continues  Left arm  weakness -Cannot be explained by UTI dehydration, patient had remote history of stroke. -Check brain MRI -Continue aspirin, PT evaluation tomorrow -Start Statin when Rhabdo improves.  Rhabdomyolysis -Nontraumatic, likely from prolonged lying down on the floor, no signs of symptoms of compartment syndrome.  Aggressive hydration recheck CK in the morning  Heart murmur -Suspect patient has at least moderate aortic stenosis, discussed with patient's niece at bedside who is unaware of such diagnosed in the past. -Check echo  UTI -Ceftriaxone, check PVR  Hypokalemia -Probably from HCTZ, replenish and recheck  Hyponatremia -Discontinue HCTZ  Moderate protein calorie malnutrition -Consult dietitian  Cholesteatoma of right ear -Discussed with on-call ENT Dr. Jenne Pane, recommend outpatient follow-up.  HTN -Stable, continue Coreg and amlodipine, discontinue HCTZ as mentioned above.  Schizophrenia -Stable.  DVT prophylaxis: Lovenox Code Status: Full Family Communication: Niece at bedside Disposition Plan: Expect 2 to 3 days hospital for treatment of rhabdomyolysis, dehydration, MRI to rule stroke and echocardiogram to investigate heart murmur which might be contributing factor for the fall/near syncope? Consults called: ENT Admission status: Tele admit   Emeline General MD [facility] Pager [phone]  05/06/2020, 1:43 PM

## 2020-05-06 NOTE — ED Provider Notes (Signed)
[facility] EMERGENCY DEPARTMENT Provider Note   CSN: [id number] Arrival date & time: 05/06/20  1916     History Chief Complaint  Patient presents with  . Altered Mental Status    Alan [name] is a 74 y.o. male who presents for unwitnessed fall.  He has a past medical history of schizophrenia, tobacco abuse, severe protein calorie malnutrition and previous urinary tract infection as well as stroke.  There is a level 5 caveat due to altered mental status.  EMS reports that the patient was found down by family for an unknown amount of time.  The patient is unable to give me a timeline or any accurate history.  Multiple attempts at contact with family have failed.  No other history can be obtained from the patient.  HPI      Past Medical History:  Diagnosis Date  . Esophageal stricture   . Hypertension   . PTSD (post-traumatic stress disorder)   . Stroke [facility])     Patient Active Problem List   Diagnosis Date Noted  . Rhabdomyolysis 05/06/2020  . Altered mental status   . Elevated CK   . Urinary tract infection without hematuria 02/16/2012  . Generalized weakness 02/16/2012  . HTN (hypertension) 02/16/2012  . Cough 02/16/2012  . Tobacco abuse 02/16/2012  . Severe protein-calorie malnutrition (HCC) 02/16/2012  . Hyponatremia 02/16/2012  . Hypokalemia 02/16/2012  . Dehydration with hyponatremia 02/16/2012  . Schizophrenia (HCC) 02/16/2012  . Depression 02/16/2012    Past Surgical History:  Procedure Laterality Date  . MELANOMA EXCISION    . ORIF FEMUR FRACTURE- LISS PLATE         History reviewed. No pertinent family history.  Social History   Tobacco Use  . Smoking status: Current Every Day Smoker    Packs/day: 1.50  . Smokeless tobacco: Never Used  Substance Use Topics  . Alcohol use: No  . Drug use: No    Home Medications Prior to Admission medications   Medication Sig Start Date End Date Taking? Authorizing Provider  amLODipine  (NORVASC) 5 MG tablet Take 5 mg by mouth every morning.   Yes [provider]  aspirin EC 325 MG tablet Take 325 mg by mouth daily.   Yes [provider]  carvedilol (COREG) 25 MG tablet Take 1 tablet (25 mg total) by mouth 2 (two) times daily with a meal. Patient taking differently: Take 25 mg by mouth daily.  02/20/12 05/06/20 Yes Dorothea Ogle, MD  hydrochlorothiazide (MICROZIDE) 12.5 MG capsule Take 12.5 mg by mouth daily. 04/12/20  Yes [provider]  risperiDONE (RISPERDAL) 1 MG tablet Take 1 mg by mouth daily.   Yes [provider]  vitamin E 400 UNIT capsule Take 400 Units by mouth every morning.   Yes [provider]  benztropine (COGENTIN) 0.5 MG tablet Take 1 tablet (0.5 mg total) by mouth 2 (two) times daily. Patient not taking: Reported on 05/06/2020 02/20/12 02/19/13  Dorothea Ogle, MD  tiotropium [facility]) 18 MCG inhalation capsule Place 1 capsule (18 mcg total) into inhaler and inhale daily. Patient not taking: Reported on 05/06/2020 02/20/12 02/19/13  Dorothea Ogle, MD    Allergies    Patient has no known allergies.  Review of Systems   Review of Systems Unable to review systems due to altered mental status Physical Exam Updated Vital Signs BP (!) 153/56   Pulse 75   Temp 98 F (36.7 C)   Resp 18   SpO2  98%   Physical Exam Vitals reviewed.  Constitutional:      Comments: Patient appears underweight  HENT:     Head: Normocephalic.     Mouth/Throat:     Mouth: Mucous membranes are dry.     Comments: Dark dark substance dried around the mouth of the patient, edentulous Eyes:     Extraocular Movements: Extraocular movements intact.     Pupils: Pupils are equal, round, and reactive to light.  Neck:     Comments: Patient c-collar in place Cardiovascular:     Rate and Rhythm: Normal rate.  Pulmonary:     Effort: Pulmonary effort is normal.     Comments: Mildly tachypneic Abdominal:     General: There is no distension.      Tenderness: There is no abdominal tenderness.  Musculoskeletal:     Comments: Left Leg Is somewhat shortened and internally rotated. Calf appears atrophied No pain with hip ROM   Neurological:     Mental Status: He is alert.     ED Results / Procedures / Treatments   Labs (all labs ordered are listed, but only abnormal results are displayed) Labs Reviewed  COMPREHENSIVE METABOLIC PANEL - Abnormal; Notable for the following components:      Result Value   Sodium 128 (*)    Potassium 3.2 (*)    Chloride 90 (*)    Glucose, Bld 129 (*)    Calcium 8.8 (*)    AST 59 (*)    Total Bilirubin 1.5 (*)    All other components within normal limits  URINALYSIS, COMPLETE (UACMP) WITH MICROSCOPIC - Abnormal; Notable for the following components:   APPearance HAZY (*)    Glucose, UA 50 (*)    Hgb urine dipstick MODERATE (*)    Ketones, ur 20 (*)    Protein, ur 100 (*)    Nitrite POSITIVE (*)    All other components within normal limits  LACTIC ACID, PLASMA - Abnormal; Notable for the following components:   Lactic Acid, Venous 2.3 (*)    All other components within normal limits  CK - Abnormal; Notable for the following components:   Total CK 2,126 (*)    All other components within normal limits  CBC WITH DIFFERENTIAL/PLATELET - Abnormal; Notable for the following components:   WBC 18.1 (*)    Neutro Abs 16.5 (*)    Lymphs Abs 0.4 (*)    Abs Immature Granulocytes 0.12 (*)    All other components within normal limits  SARS CORONAVIRUS 2 BY RT PCR (HOSPITAL ORDER, PERFORMED IN [facility])  CULTURE, BLOOD (ROUTINE X 2)  CULTURE, BLOOD (ROUTINE X 2)  URINE CULTURE  AMMONIA  ETHANOL  PROTIME-INR  RAPID URINE DRUG SCREEN, HOSP PERFORMED  CBC WITH DIFFERENTIAL/PLATELET  CBG MONITORING, ED    EKG EKG Interpretation  Date/Time:  Saturday May 06 2020 10:00:15 EDT Ventricular Rate:  166 PR Interval:    QRS Duration: 157 QT Interval:  298 QTC Calculation: 462 R  Axis:   82 Text Interpretation: Extreme tachycardia with wide complex, no further rhythm analysis attempted Artifact in lead(s) II aVR aVF V1 V2 V3 V4 V5 V6 Baseline wander Abnormal ECG Confirmed by Gerhard MunchLockwood, Robert [phone]([id number]) on 05/06/2020 12:53:45 PM   Radiology CT HEAD WO CONTRAST  Result Date: 05/06/2020 CLINICAL DATA:  Ataxia.  Head trauma. EXAM: CT HEAD WITHOUT CONTRAST TECHNIQUE: Contiguous axial images were obtained from the base of the skull through the vertex without intravenous contrast. COMPARISON:  None. FINDINGS: Brain: Mild cerebral atrophy. Encephalomalacia involving the medial right frontal lobe. Evidence for encephalomalacia in the posterior left parietal lobe. Scattered areas of periventricular low density. Negative for acute hemorrhage, mass lesion, midline shift, hydrocephalus or large new infarct. Vascular: No hyperdense vessel or unexpected calcification. Skull: Normal. Negative for fracture or focal lesion. Sinuses/Orbits: Right mastoid sinus is opacified and there is a soft tissue lesion in the mastoid antrum obstructing the mastoid sinus and extending to the right middle ear ossicles. Findings are suggestive for a cholesteatoma. Visualized paranasal sinuses are unremarkable. Other: None. IMPRESSION: 1. Soft tissue lesion in the right mastoid antrum that is obstructing the right mastoid sinus and extending to the right middle ear ossicles. 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 2. Scattered areas of encephalomalacia and white matter disease in the brain. Findings are likely related to previous insults or infarcts. No acute intracranial abnormality. These results were called by telephone at the time of interpretation on 05/06/2020 at 12:08 pm to provider Tareka Jhaveri , who verbally acknowledged these results. Electronically Signed   By: Richarda Overlie M.D.   On: 05/06/2020 12:09   CT Cervical Spine Wo Contrast  Result Date: 05/06/2020 CLINICAL DATA:  Fall. EXAM: CT CERVICAL  SPINE WITHOUT CONTRAST TECHNIQUE: Multidetector CT imaging of the cervical spine was performed without intravenous contrast. Multiplanar CT image reconstructions were also generated. COMPARISON:  Head CT 05/06/2020 FINDINGS: Alignment: Normal. Skull base and vertebrae: No acute fracture. No primary bone lesion or focal pathologic process. Mild degenerative facet disease. Soft tissues and spinal canal: No prevertebral fluid or swelling. No visible canal hematoma. Disc levels: No significant disc space narrowing. Mild posterior disc space narrowing at C3-C4. Upper chest: Negative Other: Carotid artery calcifications. 9 mm low-density nodule in the right thyroid lobe. Visualized right mastoid sinus is opacified. IMPRESSION: No acute abnormality to the cervical spine. Electronically Signed   By: Richarda Overlie M.D.   On: 05/06/2020 12:18   DG Chest Port 1 View  Result Date: 05/06/2020 CLINICAL DATA:  Status post fall today.  Initial encounter. EXAM: PORTABLE CHEST 1 VIEW COMPARISON:  PA and lateral chest 10/12/2010. FINDINGS: The lungs are emphysematous but clear. Aortic atherosclerosis. Heart size normal. No pneumothorax or pleural effusion. No acute bony abnormality. IMPRESSION: No acute disease. Aortic Atherosclerosis (ICD10-I70.0) and Emphysema (ICD10-J43.9). Electronically Signed   By: Drusilla Kanner M.D.   On: 05/06/2020 11:11   DG HIP UNILAT WITH PELVIS 2-3 VIEWS LEFT  Result Date: 05/06/2020 CLINICAL DATA:  Left hip pain. EXAM: DG HIP (WITH OR WITHOUT PELVIS) 2-3V LEFT COMPARISON:  Pelvis 10/12/2010 FINDINGS: Pelvic bony ring is intact. Left hip is located without a fracture. No gross abnormality to the right hip. Large amount of stool in the pelvis and right lower abdomen. Atherosclerotic calcifications. IMPRESSION: No acute bone abnormality in the pelvis or left hip. Large amount of stool in the abdomen and pelvis. Electronically Signed   By: Richarda Overlie M.D.   On: 05/06/2020 12:32   DG HIP UNILAT WITH  PELVIS 2-3 VIEWS RIGHT  Result Date: 05/06/2020 CLINICAL DATA:  Fall. EXAM: DG HIP (WITH OR WITHOUT PELVIS) 2-3V RIGHT COMPARISON:  Left hip 05/06/2020 FINDINGS: Right hip is located without a fracture. Large amount of stool in the abdomen and pelvis. Visualized pelvic bony structures are intact. IMPRESSION: No acute bone abnormality to the right hip. Electronically Signed   By: Richarda Overlie M.D.   On: 05/06/2020 12:34    Procedures .Critical Care Performed  by: Arthor Captain, PA-C Authorized by: Arthor Captain, PA-C   Critical care provider statement:    Critical care time (minutes):  52   Critical care was necessary to treat or prevent imminent or life-threatening deterioration of the following conditions:  CNS failure or compromise and dehydration   Critical care was time spent personally by me on the following activities:  Discussions with consultants, evaluation of patient's response to treatment, examination of patient, ordering and performing treatments and interventions, ordering and review of laboratory studies, ordering and review of radiographic studies, pulse oximetry, re-evaluation of patient's condition, obtaining history from patient or surrogate and review of old charts   (including critical care time)  Medications Ordered in ED Medications  0.9 %  sodium chloride infusion ( Intravenous New Bag/Given 05/06/20 1343)  aspirin EC tablet 325 mg (325 mg Oral Given 05/06/20 1504)  amLODipine (NORVASC) tablet 5 mg (5 mg Oral Given 05/06/20 1504)  risperiDONE (RISPERDAL) tablet 1 mg (has no administration in time range)  vitamin E capsule 400 Units (400 Units Oral Given 05/06/20 1504)  enoxaparin (LOVENOX) injection 40 mg (40 mg Subcutaneous Given 05/06/20 1504)  cefTRIAXone (ROCEPHIN) 1 g in sodium chloride 0.9 % 100 mL IVPB (has no administration in time range)  acetaminophen (TYLENOL) tablet 650 mg (has no administration in time range)    Or  acetaminophen (TYLENOL) suppository 650  mg (has no administration in time range)  LORazepam (ATIVAN) tablet 0.5 mg (has no administration in time range)  carvedilol (COREG) tablet 12.5 mg (has no administration in time range)  cefTRIAXone (ROCEPHIN) 2 g in sodium chloride 0.9 % 100 mL IVPB (0 g Intravenous Stopped 05/06/20 1405)  sodium chloride 0.9 % bolus 1,250 mL (0 mLs Intravenous Stopped 05/06/20 1455)  potassium chloride SA (KLOR-CON) CR tablet 40 mEq (40 mEq Oral Given 05/06/20 1321)    ED Course  I have reviewed the triage vital signs and the nursing notes.  Pertinent labs & imaging results that were available during my care of the patient were reviewed by me and considered in my medical decision making (see chart for details).  Clinical Course as of May 06 1614  Sat May 06, 2020  1241 Sodium(!): 128 [AH]    Clinical Course User Index [AH] Arthor Captain, PA-C   MDM Rules/Calculators/A&P                          CC:ams VS:  Vitals:   05/06/20 1058 05/06/20 1113 05/06/20 1415 05/06/20 1537  BP:  (!) 144/86 (!) 147/91 (!) 153/56  Pulse: 94 71 (!) 114 75  Resp: 15 (!) 29 (!) 26 18  Temp:    98 F (36.7 C)  TempSrc:      SpO2: 97% 97% 94% 98%   ZO:XWRUEAV is gathered by EMS and Nephew at bedside. Previous records obtained and reviewed. DDX:The patient's complaint of AMS involves an extensive number of diagnostic and treatment options, and is a complaint that carries with it a high risk of complications, morbidity, and potential mortality. Given the large differential diagnosis, medical decision making is of high complexity. The differential diagnosis for AMS is extensive and includes, but is not limited to: drug overdose - opioids, alcohol, sedatives, antipsychotics, drug withdrawal, others; Metabolic: hypoxia, hypoglycemia, hyperglycemia, hypercalcemia, hypernatremia, hyponatremia, uremia, hepatic encephalopathy, hypothyroidism, hyperthyroidism, vitamin B12 or thiamine deficiency, carbon monoxide poisoning, Wilson's  disease, Lactic acidosis, DKA/HHOS; Infectious: meningitis, encephalitis, bacteremia/sepsis, urinary tract infection, pneumonia, neurosyphilis; Structural: Space-occupying  lesion, (brain tumor, subdural hematoma, hydrocephalus,); Vascular: stroke, subarachnoid hemorrhage, coronary ischemia, hypertensive encephalopathy, CNS vasculitis, thrombotic thrombocytopenic purpura, disseminated intravascular coagulation, hyperviscosity; Psychiatric: Schizophrenia, depression; Other: Seizure, hypothermia, heat stroke, ICU psychosis, dementia -"sundowning."   Labs: I ordered reviewed and interpreted labs which include urinalysis which is positive for UTI.  UDS negative.  Covid test is negative.  Lactic acid mildly elevated 2.3.  Sodium low at 128 which appears to be chronic.  CBC shows elevated white blood cell count.  Patient does fit sepsis criteria with elevated respiratory and heart rate.  Patient was given Rocephin IV and also given 30 mL/kg of fluids. Imaging: I ordered and reviewed images which included bilateral hip and pelvis, CT C-spine, CT head, portable 1 view chest. I independently visualized and interpreted all imaging. Significant findings include cholesteatoma noted on CT scan. There are otherwise no acute, significant findings on today's images. EKG: EKG shows A. fib.  He does not appear to have a rate of 160 however there is significant artifact.  I disagree with the automatic interpretation Consults: Hospitalist MDM: Patient here with altered mental status.  He appears to have a urinary tract infection.  His nephew states that he helps him at home take a bath once or twice a week.  He is otherwise at home and uses a wheelchair or walker however he has had significant decline as of late.  Will be admitted. Patient disposition:admit The patient appears reasonably stabilized for admission considering the current resources, flow, and capabilities available in the ED at this time, and I doubt any other [facility]  requiring further screening and/or treatment in the ED prior to admission.        Final Clinical Impression(s) / ED Diagnoses Final diagnoses:  Left hip pain  Altered mental status, unspecified altered mental status type  Urinary tract infection without hematuria, site unspecified  Elevated CK  Cholesteatoma, unspecified laterality    Rx / DC Orders ED Discharge Orders    None       Arthor Captain, PA-C 05/06/20 1615    Gerhard Munch, MD 05/07/20 1329

## 2020-05-06 NOTE — ED Triage Notes (Addendum)
Pt BIB EMS from home due to fall . Family called EMS and states pt was on floor when they arrive. Pt lives by himself. Pt doesn't remember falling. Pt is a&o x1. Per EMS last known normal was 3 days.Pt reports he thinks he fell Monday.

## 2020-05-06 NOTE — ED Notes (Signed)
Help get patient undress on the monitor did ekg shown to Dr Jeraldine Loots patient is resting with call bell in reach got patient some warm blankets

## 2020-05-06 NOTE — Progress Notes (Signed)
NEW ADMISSION NOTE New Admission Note:   Arrival Method: E.D strecher Mental Orientation:  Alert to self,place. Telemetry:# 13 Ca lled and confirmed. Assessment: Completed Skin:Assed with Lang Snow R.N Sacral pressure ulcer stage II ZO:XWRUE hand with normal saline infusion and left a/c SL. Pain:Denies Tubes:None Safety Measures: Safety Fall Prevention Plan has been given, discussed and signed Admission: Completed [facility] Orientation: Patient has been orientated to the room, unit and staff.  Family:Son at thebedside.  Orders have been reviewed and implemented. Will continue to monitor the patient. Call light has been placed within reach and bed alarm has been activated.   Beaulieu, Kem Kays, RN

## 2020-05-07 ENCOUNTER — Inpatient Hospital Stay (HOSPITAL_COMMUNITY): Payer: Medicare Other

## 2020-05-07 DIAGNOSIS — L8990 Pressure ulcer of unspecified site, unspecified stage: Secondary | ICD-10-CM | POA: Insufficient documentation

## 2020-05-07 DIAGNOSIS — I361 Nonrheumatic tricuspid (valve) insufficiency: Secondary | ICD-10-CM

## 2020-05-07 LAB — ECHOCARDIOGRAM COMPLETE
Area-P 1/2: 2.32 cm2
S' Lateral: 2.45 cm
Weight: 1940.05 oz

## 2020-05-07 LAB — CBC
HCT: 34.2 % — ABNORMAL LOW (ref 39.0–52.0)
Hemoglobin: 11.8 g/dL — ABNORMAL LOW (ref 13.0–17.0)
MCH: 29.6 pg (ref 26.0–34.0)
MCHC: 34.5 g/dL (ref 30.0–36.0)
MCV: 85.9 fL (ref 80.0–100.0)
Platelets: 178 10*3/uL (ref 150–400)
RBC: 3.98 MIL/uL — ABNORMAL LOW (ref 4.22–5.81)
RDW: 13 % (ref 11.5–15.5)
WBC: 12.9 10*3/uL — ABNORMAL HIGH (ref 4.0–10.5)
nRBC: 0 % (ref 0.0–0.2)

## 2020-05-07 LAB — BASIC METABOLIC PANEL
Anion gap: 13 (ref 5–15)
BUN: 19 mg/dL (ref 8–23)
CO2: 21 mmol/L — ABNORMAL LOW (ref 22–32)
Calcium: 7.8 mg/dL — ABNORMAL LOW (ref 8.9–10.3)
Chloride: 98 mmol/L (ref 98–111)
Creatinine, Ser: 0.61 mg/dL (ref 0.61–1.24)
GFR calc Af Amer: >60 mL/min (ref >60)
GFR calc non Af Amer: >60 mL/min (ref >60)
Glucose, Bld: 95 mg/dL (ref 70–99)
Potassium: 2.7 mmol/L — CL (ref 3.5–5.1)
Sodium: 132 mmol/L — ABNORMAL LOW (ref 135–145)

## 2020-05-07 LAB — TSH: TSH: 5.512 u[IU]/mL — ABNORMAL HIGH (ref 0.350–4.500)

## 2020-05-07 LAB — MAGNESIUM: Magnesium: 1.6 mg/dL — ABNORMAL LOW (ref 1.7–2.4)

## 2020-05-07 LAB — VITAMIN B12: Vitamin B-12: 654 pg/mL (ref 180–914)

## 2020-05-07 LAB — CK: Total CK: 1150 U/L — ABNORMAL HIGH (ref 49–397)

## 2020-05-07 MED ORDER — SODIUM CHLORIDE 0.9 % IV SOLN: INTRAVENOUS | Status: DC

## 2020-05-07 MED ORDER — IOHEXOL 350 MG/ML SOLN
75.0000 mL | Freq: Once | INTRAVENOUS | Status: AC | PRN
  Administered 2020-05-07: 75 mL via INTRAVENOUS

## 2020-05-07 MED ORDER — MAGNESIUM SULFATE 2 GM/50ML IV SOLN
2.0000 g | Freq: Once | INTRAVENOUS | Status: AC
  Administered 2020-05-07: 2 g via INTRAVENOUS
  Filled 2020-05-07: qty 50

## 2020-05-07 MED ORDER — SODIUM CHLORIDE 0.9 % IV SOLN
2.0000 g | INTRAVENOUS | Status: DC
  Administered 2020-05-07: 2 g via INTRAVENOUS
  Filled 2020-05-07 (×2): qty 2

## 2020-05-07 MED ORDER — POTASSIUM CHLORIDE CRYS ER 20 MEQ PO TBCR
20.0000 meq | EXTENDED_RELEASE_TABLET | Freq: Once | ORAL | Status: AC
  Administered 2020-05-07: 20 meq via ORAL
  Filled 2020-05-07: qty 1

## 2020-05-07 MED ORDER — CHLORHEXIDINE GLUCONATE CLOTH 2 % EX PADS
6.0000 | MEDICATED_PAD | Freq: Every day | CUTANEOUS | Status: DC
  Administered 2020-05-08 – 2020-05-12 (×6): 6 via TOPICAL

## 2020-05-07 MED ORDER — POTASSIUM CHLORIDE 10 MEQ/100ML IV SOLN
10.0000 meq | INTRAVENOUS | Status: AC
  Administered 2020-05-07 (×4): 10 meq via INTRAVENOUS
  Filled 2020-05-07 (×4): qty 100

## 2020-05-07 MED ORDER — POTASSIUM CHLORIDE CRYS ER 20 MEQ PO TBCR
40.0000 meq | EXTENDED_RELEASE_TABLET | Freq: Once | ORAL | Status: AC
  Administered 2020-05-07: 40 meq via ORAL
  Filled 2020-05-07: qty 2

## 2020-05-07 NOTE — Progress Notes (Signed)
Abdomen distended,firm.Patient had had no output this shift thus far. Patient had not had any po intake, but had IV NS at 100cc/hr. Bladder scan performed and showed >800cc.[place], NP texted and order received for an in and out cath.

## 2020-05-07 NOTE — Evaluation (Signed)
Physical Therapy Evaluation Patient Details Name: Alan [name] MRN: [id number] DOB: 12-10-1945 Today's Date: 05/07/2020   History of Present Illness  Alan [name] is a 74 y.o. male with medical history significant of HTN,  PTSD, schizophrenia, presented with altered mental status and fall.  Patient [place], niece lives close, and neighbor who visit him 1-2 times a week.  Niece visited him this morning found him on the floor, lying down, awake and confused. Unsure how long he was down; found to have Rhabdomyolysis  Clinical Impression   Pt admitted with above diagnosis. Comes from home where he lives alone, and family checks in on him regularly; Independent at baseline, walking short household distances; Presents to PT with incr fall risk, decr activity tolerance, generalized weakness; Will need to reach independent functional level to be able to dc home, rec post-acute rehab Pt currently with functional limitations due to the deficits listed below (see PT Problem List). Pt will benefit from skilled PT to increase their independence and safety with mobility to allow discharge to the venue listed below.       Follow Up Recommendations SNF    Equipment Recommendations  Rolling walker with 5" wheels;3in1 (PT)    Recommendations for Other Services OT consult (will order per protocol)     Precautions / Restrictions Precautions Precautions: Fall      Mobility  Bed Mobility Overal bed mobility: Needs Assistance Bed Mobility: Supine to Sit     Supine to sit: Mod assist     General bed mobility comments: Mod assist to elevate trunk to sit; mod assist and use of bedpad to reciprocally scoot to EOB and get feet to the floor  Transfers Overall transfer level: Needs assistance Equipment used: Rolling walker (2 wheeled) Transfers: Sit to/from Stand Sit to Stand: Mod assist         General transfer comment: mod assist to rise and suport while moving hands to  RW  Ambulation/Gait Ambulation/Gait assistance: Mod assist Gait Distance (Feet): 3 Feet Assistive device: Rolling walker (2 wheeled) Gait Pattern/deviations: Festinating     General Gait Details: small, festinating-like steps; tending to lean posteriorly  Stairs            Wheelchair Mobility    Modified Rankin (Stroke Patients Only)       Balance Overall balance assessment: Needs assistance   Sitting balance-Leahy Scale: Fair       Standing balance-Leahy Scale: Zero                               Pertinent Vitals/Pain Pain Assessment: No/denies pain    Home Living Family/patient expects to be discharged to:: Unsure Living Arrangements: Alone Available Help at Discharge: Family;Available PRN/intermittently (neice checks in) Type of Home: House Home Access: Stairs to enter   [employer] of Steps: 3 Home Layout: One level Home Equipment: Walker - 2 wheels      Prior Function Level of Independence: Independent;Independent with assistive device(s)         Comments: uses cane/RW prn; Tells me a family member helps with groceries     Hand Dominance        Extremity/Trunk Assessment   Upper Extremity Assessment Upper Extremity Assessment: Generalized weakness    Lower Extremity Assessment Lower Extremity Assessment: Generalized weakness       Communication   Communication: Other (comment) (requires incr time to answer questions)  Cognition Arousal/Alertness: Awake/alert  Behavior During Therapy: Flat affect Overall Cognitive Status: No family/caregiver present to determine baseline cognitive functioning                                        General Comments      Exercises     Assessment/Plan    PT Assessment Patient needs continued PT services  PT Problem List Decreased strength;Decreased activity tolerance;Decreased balance;Decreased mobility;Decreased coordination;Decreased cognition;Decreased  safety awareness;Decreased knowledge of use of DME;Decreased knowledge of precautions       PT Treatment Interventions DME instruction;Gait training;Stair training;Functional mobility training;Therapeutic activities;Therapeutic exercise;Balance training;Patient/family education    PT Goals (Current goals can be found in the Care Plan section)  Acute Rehab PT Goals Patient Stated Goal: Did not state, but agreeable to getting OOB PT Goal Formulation: With patient Time For Goal Achievement: 05/21/20 Potential to Achieve Goals: Fair    Frequency Min 2X/week   Barriers to discharge Decreased caregiver support      Co-evaluation               AM-PAC PT "6 Clicks" Mobility  Outcome Measure Help needed turning from your back to your side while in a flat bed without using bedrails?: A Little Help needed moving from lying on your back to sitting on the side of a flat bed without using bedrails?: A Lot Help needed moving to and from a bed to a chair (including a wheelchair)?: A Lot Help needed standing up from a chair using your arms (e.g., wheelchair or bedside chair)?: A Lot Help needed to walk in hospital room?: A Lot Help needed climbing 3-5 steps with a railing? : A Lot 6 Click Score: 13    End of Session Equipment Utilized During Treatment: Gait belt Activity Tolerance: Patient tolerated treatment well Patient left: in chair;with call bell/phone within reach;with chair alarm set Nurse Communication: Mobility status PT Visit Diagnosis: Unsteadiness on feet (R26.81);Other abnormalities of gait and mobility (R26.89);History of falling (Z91.81);Muscle weakness (generalized) (M62.81)    Time: 7035-0093 PT Time Calculation (min) (ACUTE ONLY): 25 min   Charges:   PT Evaluation $PT Eval Moderate Complexity: 1 Mod PT Treatments $Therapeutic Activity: 8-22 mins        Van Clines, PT  Acute Rehabilitation Services Pager [phone] Office [phone]   [name] 05/07/2020, 4:47 PM

## 2020-05-07 NOTE — Progress Notes (Signed)
Orthopedic Tech Progress Note Patient Details:  Alan [name] [birth year] [id number]  Ortho Devices Type of Ortho Device: Arm sling Ortho Device/Splint Location: LUE Ortho Device/Splint Interventions: Application   Post Interventions Patient Tolerated: Well Instructions Provided: Adjustment of device   [name] E Daphna Lafuente 05/07/2020, 9:09 PM

## 2020-05-07 NOTE — Progress Notes (Signed)
PROGRESS NOTE    Alan [name]  SKA:[id number] DOB: December 26, 1945 DOA: 05/06/2020 PCP: Barbie Banner, MD   Brief Narrative: 74 year old with past medical history significant for hypertension, PTSD, schizophrenia presented with altered mental status and fall.  Patient was very confused in the ED most of the history was provided by patient niece who was at bedside.  Patient [place], niece visits him 1 or twice a week.  Patient was found on the floor by his niece.  He was awake and confused. Patient  Report that he fell down on Monday.  At baseline patient is able to ambulate with the assistance of a walker.  Patient appears more confused than his baseline.  Patient reported generalized weakness before he fell down. Evaluation in the ED UA consistent with infection, positive nitrates, white blood cells 6-10.  White blood cell 18.  CT showed cholesteatoma.     Assessment & Plan:   Active Problems:   Urinary tract infection without hematuria   Rhabdomyolysis   Pressure injury of skin  1-Rhabdomyolysis;  Nontraumatic, due to prolonged immobilization. Continue with IV fluids. Follow CK level trend. 2126----1150  2-Acute metabolic encephalopathy Patient was alert oriented to place and name. Continue to correct electrolyte abnormalities. Continue with treatment of urinary tract infection  3-UTI, Pseudomonas, urinary retention: Patient presents with leukocytosis.  UA with positive nitrates, white blood cell 6-10. Urine culture growing Pseudomonas. Change ceftriaxone to cefepime Blood cultures -No growth today.  Hypomagnesemia: Replete IV. Hypokalemia, replete with oral and IV potassium.   Heart murmur; no significant valvular disease 2D echo..  Left arm weakness, left LE weakness;  -Patient with lot of bruise the left arm and unable to move it.  Will get x-ray -Discuss care with neurologist, in regards lower extremity weakness will need to first correct electrolyte  derangement, if weakness persist will need formal neurology evaluation. -MRI: Negative for acute stroke. Loss of the normal flow voids of the intracranial internal carotid arteries, which may reflect slow flow or chronic occlusion. There appears to be reconstitution at the level of the clinoid processes -Due to abnormal MRI plan to do CT angio head and neck  Hyponatremia; continue with IV fluids.  Likely related to hypovolemia  Moderate protein caloric malnutrition; start Ensure  Cholesteatoma of right ear: Case discussed with on-call ENT Dr. Jenne Pane, recommend outpatient follow-up  Hypertension; continue with Norvasc, Coreg  Schizophrenia; continue with Risperdal   Pressure Ulcer 02/16/12 Stage I -  Intact skin with non-blanchable redness of a localized area usually over a bony prominence. Red (Active)  02/16/12 2050  Location: Sacrum  Location Orientation:   Staging: Stage I -  Intact skin with non-blanchable redness of a localized area usually over a bony prominence.  Wound Description (Comments): Red  Present on Admission: Yes     Pressure Injury 05/06/20 Sacrum Mid Stage 2 -  Partial thickness loss of dermis presenting as a shallow open injury with a red, pink wound bed without slough. Partial loss of dermis,shallow red wound bed with scant serous drainage. (Active)  05/06/20 1600  Location: Sacrum  Location Orientation: Mid  Staging: Stage 2 -  Partial thickness loss of dermis presenting as a shallow open injury with a red, pink wound bed without slough.  Wound Description (Comments): Partial loss of dermis,shallow red wound bed with scant serous drainage.  Present on Admission: Yes                  Estimated  body mass index is 20.18 kg/m as calculated from the following:   Height as of 02/16/12: 5\' 5"  (1.651 m).   Weight as of this encounter: 55 kg.   DVT prophylaxis: Lovenox Code Status: Full code Family Communication: No family at bedside Disposition Plan:    Status is: Inpatient  Remains inpatient appropriate because:Hemodynamically unstable and Persistent severe electrolyte disturbances   Dispo: The patient is from: Home              Anticipated d/c is to: To be determined              Anticipated d/c date is: 2 days              Patient currently is not medically stable to d/c.        Consultants:     Procedures:   Echo:  Antimicrobials:    Subjective: Patient is alert speech is soft.  He report left lower extremity weakness, his leg gave up and he fell down.  He could not stand up. He is not able to move his left arm Objective: Vitals:   05/06/20 1537 05/06/20 1931 05/06/20 2333 05/07/20 0433  BP: (!) 153/56 (!) 141/62 130/67 135/64  Pulse: 75 87 80 74  Resp: 18 16 16 18   Temp: 98 F (36.7 C) 100.1 F (37.8 C) 99.4 F (37.4 C) 99.6 F (37.6 C)  TempSrc:  Oral Oral Oral  SpO2: 98% 95% 96% 95%  Weight:  55 kg      Intake/Output Summary (Last 24 hours) at 05/07/2020 0740 Last data filed at 05/07/2020 05/09/2020 Gross per 24 hour  Intake 698.81 ml  Output 1100 ml  Net -401.19 ml   Filed Weights   05/06/20 1931  Weight: 55 kg    Examination:  General exam: Appears calm and comfortable  Respiratory system: Clear to auscultation. Respiratory effort normal. Cardiovascular system: S1 & S2 heard, RRR. No JVD, murmurs, rubs, gallops or clicks. No pedal edema. Gastrointestinal system: Abdomen is nondistended, soft and nontender. No organomegaly or masses felt. Normal bowel sounds heard. Central nervous system: Alert oriented to person and place.  Follows some commands.  Left lower extremity 3 out of 5, left arm with bruises and unable to move it Extremities: Symmetric 5 x 5 power. Skin: No rashes, lesions or ulcers   Data Reviewed: I have personally reviewed following labs and imaging studies  CBC: Recent Labs  Lab 05/06/20 1020 05/07/20 0254  WBC 18.1* 12.9*  NEUTROABS 16.5*  --   HGB 13.8 11.8*  HCT 39.5  34.2*  MCV 85.1 85.9  PLT 229 178   Basic Metabolic Panel: Recent Labs  Lab 05/06/20 1022 05/07/20 0254  NA 128* 132*  K 3.2* 2.7*  CL 90* 98  CO2 23 21*  GLUCOSE 129* 95  BUN 22 19  CREATININE 0.63 0.61  CALCIUM 8.8* 7.8*  MG  --  1.6*   GFR: CrCl cannot be calculated (Unknown ideal weight.). Liver Function Tests: Recent Labs  Lab 05/06/20 1022  AST 59*  ALT 31  ALKPHOS 65  BILITOT 1.5*  PROT 6.7  ALBUMIN 3.7   No results for input(s): LIPASE, AMYLASE in the last 168 hours. Recent Labs  Lab 05/06/20 1021  AMMONIA 20   Coagulation Profile: Recent Labs  Lab 05/06/20 1022  INR 1.1   Cardiac Enzymes: Recent Labs  Lab 05/06/20 1022 05/07/20 0254  CKTOTAL 2,126* 1,150*   BNP (last 3 results) No results for input(s): PROBNP  in the last 8760 hours. HbA1C: No results for input(s): HGBA1C in the last 72 hours. CBG: No results for input(s): GLUCAP in the last 168 hours. Lipid Profile: No results for input(s): CHOL, HDL, LDLCALC, TRIG, CHOLHDL, LDLDIRECT in the last 72 hours. Thyroid Function Tests: No results for input(s): TSH, T4TOTAL, FREET4, T3FREE, THYROIDAB in the last 72 hours. Anemia Panel: No results for input(s): VITAMINB12, FOLATE, FERRITIN, TIBC, IRON, RETICCTPCT in the last 72 hours. Sepsis Labs: Recent Labs  Lab 05/06/20 1022  LATICACIDVEN 2.3*    Recent Results (from the past 240 hour(s))  SARS Coronavirus 2 by RT PCR (hospital order, performed in [facility] hospital lab) Nasopharyngeal Nasopharyngeal Swab     Status: None   Collection Time: 05/06/20 10:24 AM   Specimen: Nasopharyngeal Swab  Result Value Ref Range Status   SARS Coronavirus 2 NEGATIVE NEGATIVE Final    Comment: (NOTE) SARS-CoV-2 target nucleic acids are NOT DETECTED.  The SARS-CoV-2 RNA is generally detectable in upper and lower respiratory specimens during the acute phase of infection. The lowest concentration of SARS-CoV-2 viral copies this assay can detect is  250 copies / mL. A negative result does not preclude SARS-CoV-2 infection and should not be used as the sole basis for treatment or other patient management decisions.  A negative result may occur with improper specimen collection / handling, submission of specimen other than nasopharyngeal swab, presence of viral mutation(s) within the areas targeted by this assay, and inadequate number of viral copies (<250 copies / mL). A negative result must be combined with clinical observations, patient history, and epidemiological information.  Fact Sheet for Patients:   BoilerBrush.com.cy  Fact Sheet for Healthcare Providers: https://pope.com/  This test is not yet approved or  cleared by the [place] FDA and has been authorized for detection and/or diagnosis of SARS-CoV-2 by FDA under an Emergency Use Authorization (EUA).  This EUA will remain in effect (meaning this test can be used) for the duration of the COVID-19 declaration under Section 564(b)(1) of the Act, 21 U.S.C. section 360bbb-3(b)(1), unless the authorization is terminated or revoked sooner.  Performed at [facility], [address] [place], [place] [address]          Radiology Studies: CT HEAD WO CONTRAST  Result Date: 05/06/2020 CLINICAL DATA:  Ataxia.  Head trauma. EXAM: CT HEAD WITHOUT CONTRAST TECHNIQUE: Contiguous axial images were obtained from the base of the skull through the vertex without intravenous contrast. COMPARISON:  None. FINDINGS: Brain: Mild cerebral atrophy. Encephalomalacia involving the medial right frontal lobe. Evidence for encephalomalacia in the posterior left parietal lobe. Scattered areas of periventricular low density. Negative for acute hemorrhage, mass lesion, midline shift, hydrocephalus or large new infarct. Vascular: No hyperdense vessel or unexpected calcification. Skull: Normal. Negative for fracture or focal lesion.  Sinuses/Orbits: Right mastoid sinus is opacified and there is a soft tissue lesion in the mastoid antrum obstructing the mastoid sinus and extending to the right middle ear ossicles. Findings are suggestive for a cholesteatoma. Visualized paranasal sinuses are unremarkable. Other: None. IMPRESSION: 1. Soft tissue lesion in the right mastoid antrum that is obstructing the right mastoid sinus and extending to the right middle ear ossicles. 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 2. Scattered areas of encephalomalacia and white matter disease in the brain. Findings are likely related to previous insults or infarcts. No acute intracranial abnormality. These results were called by telephone at the time of interpretation on 05/06/2020 at 12:08 pm to provider  ABIGAIL HARRIS , who verbally acknowledged these results. Electronically Signed   By: Richarda Overlie M.D.   On: 05/06/2020 12:09   CT Cervical Spine Wo Contrast  Result Date: 05/06/2020 CLINICAL DATA:  Fall. EXAM: CT CERVICAL SPINE WITHOUT CONTRAST TECHNIQUE: Multidetector CT imaging of the cervical spine was performed without intravenous contrast. Multiplanar CT image reconstructions were also generated. COMPARISON:  Head CT 05/06/2020 FINDINGS: Alignment: Normal. Skull base and vertebrae: No acute fracture. No primary bone lesion or focal pathologic process. Mild degenerative facet disease. Soft tissues and spinal canal: No prevertebral fluid or swelling. No visible canal hematoma. Disc levels: No significant disc space narrowing. Mild posterior disc space narrowing at C3-C4. Upper chest: Negative Other: Carotid artery calcifications. 9 mm low-density nodule in the right thyroid lobe. Visualized right mastoid sinus is opacified. IMPRESSION: No acute abnormality to the cervical spine. Electronically Signed   By: Richarda Overlie M.D.   On: 05/06/2020 12:18   MR BRAIN WO CONTRAST  Result Date: 05/06/2020 CLINICAL DATA:  Altered mental status and fall EXAM: MRI  HEAD WITHOUT CONTRAST TECHNIQUE: Multiplanar, multiecho pulse sequences of the brain and surrounding structures were obtained without intravenous contrast. COMPARISON:  None. FINDINGS: Brain: There is no acute infarction or intracranial hemorrhage. There are chronic infarcts of the parasagittal right frontoparietal lobes (ACA territory) and left parietal lobe (MCA territory). There are some chronic blood products associated with the infarct on the right. Chronic small vessel infarcts of the right caudate and left corona radiata. Additional patchy and confluent areas of T2 hyperintensity in the supratentorial white matter nonspecific but may reflect moderate chronic microvascular ischemic changes. Prominence of the ventricles and sulci reflects generalized parenchymal volume loss. Small focus of susceptibility in the left frontal lobe is compatible with chronic microhemorrhage or less likely mineralization. There is no intracranial mass, mass effect, or edema. There is no hydrocephalus or extra-axial fluid collection. Vascular: Loss of the expected flow void of the intracranial internal carotid arteries to the level of the clinoid processes. Skull and upper cervical spine: Normal marrow signal is preserved. Sinuses/Orbits: Paranasal sinuses are aerated. Orbits are unremarkable. Other: Sella is unremarkable. Opacification of the right mastoid. There is some corresponding reduced diffusion. Ossicular erosion is evident on prior head CT. IMPRESSION: No acute infarction, hemorrhage, or mass. Loss of the normal flow voids of the intracranial internal carotid arteries, which may reflect slow flow or chronic occlusion. There appears to be reconstitution at the level of the clinoid processes. Multiple chronic infarcts.  Chronic microvascular ischemic changes. Abnormal appearance of the right mastoid and middle ear. Correlating with prior CT, this is most consistent with a cholesteatoma. Electronically Signed   By: Guadlupe Spanish M.D.   On: 05/06/2020 19:31   DG Chest Port 1 View  Result Date: 05/06/2020 CLINICAL DATA:  Status post fall today.  Initial encounter. EXAM: PORTABLE CHEST 1 VIEW COMPARISON:  PA and lateral chest 10/12/2010. FINDINGS: The lungs are emphysematous but clear. Aortic atherosclerosis. Heart size normal. No pneumothorax or pleural effusion. No acute bony abnormality. IMPRESSION: No acute disease. Aortic Atherosclerosis (ICD10-I70.0) and Emphysema (ICD10-J43.9). Electronically Signed   By: Drusilla Kanner M.D.   On: 05/06/2020 11:11   DG HIP UNILAT WITH PELVIS 2-3 VIEWS LEFT  Result Date: 05/06/2020 CLINICAL DATA:  Left hip pain. EXAM: DG HIP (WITH OR WITHOUT PELVIS) 2-3V LEFT COMPARISON:  Pelvis 10/12/2010 FINDINGS: Pelvic bony ring is intact. Left hip is located without a fracture. No gross abnormality to the right hip.  Large amount of stool in the pelvis and right lower abdomen. Atherosclerotic calcifications. IMPRESSION: No acute bone abnormality in the pelvis or left hip. Large amount of stool in the abdomen and pelvis. Electronically Signed   By: Richarda OverlieAdam  Henn M.D.   On: 05/06/2020 12:32   DG HIP UNILAT WITH PELVIS 2-3 VIEWS RIGHT  Result Date: 05/06/2020 CLINICAL DATA:  Fall. EXAM: DG HIP (WITH OR WITHOUT PELVIS) 2-3V RIGHT COMPARISON:  Left hip 05/06/2020 FINDINGS: Right hip is located without a fracture. Large amount of stool in the abdomen and pelvis. Visualized pelvic bony structures are intact. IMPRESSION: No acute bone abnormality to the right hip. Electronically Signed   By: Richarda OverlieAdam  Henn M.D.   On: 05/06/2020 12:34        Scheduled Meds: . amLODipine  5 mg Oral q morning - 10a  . aspirin EC  325 mg Oral Daily  . carvedilol  12.5 mg Oral BID WC  . enoxaparin (LOVENOX) injection  40 mg Subcutaneous Q24H  . potassium chloride  40 mEq Oral Once  . risperiDONE  1 mg Oral Daily  . vitamin E  400 Units Oral q morning - 10a   Continuous Infusions: . sodium chloride 100 mL/hr at  05/06/20 1756  . cefTRIAXone (ROCEPHIN)  IV    . magnesium sulfate bolus IVPB    . potassium chloride 10 mEq (05/07/20 0648)     LOS: 1 day    Time spent: 35 minutes.     Alba CoryBelkys A Kimanh Templeman, MD [facility]   If 7PM-7AM, please contact night-coverage [email or web address]  05/07/2020, 7:40 AM

## 2020-05-07 NOTE — Progress Notes (Signed)
Pharmacy Antibiotic Note  Alan [name] is a 74 y.o. male admitted on 05/06/2020 with UTI.  Pharmacy has been consulted for cefepime dosing. Patient presented to the ED after experiencing a fall and AMS.  Niece reports that the patient experienced episode around 2 years ago when he had a UTI and that his mental status is not at baseline. Scr 0.61, baseline ~0.5. CrCl ~48 mL/min using Scr of 1.   Plan: Cefepime 2g IV q24h  F/u sensitivity data and LOT.  Weight: 55 kg (121 lb 4.1 oz)  Temp (24hrs), Avg:99.1 F (37.3 C), Min:98 F (36.7 C), Max:100.1 F (37.8 C)  Recent Labs  Lab 05/06/20 1020 05/06/20 1022 05/07/20 0254  WBC 18.1*  --  12.9*  CREATININE  --  0.63 0.61  LATICACIDVEN  --  2.3*  --     CrCl cannot be calculated (Unknown ideal weight.).    No Known Allergies  Antimicrobials this admission: Ceftriaxone 7/17 >> 7/18 Cefepime 7/18 >>   Microbiology results: 7/17 BCx: NG x2 7/17 UCx: Pseudomonas (hx of pan sens Pseudo in 2013)   Thank you for allowing pharmacy to be a part of this patient's care.  Rexford Maus, PharmD PGY-1 Acute Care Pharmacy Resident Office: [phone] 05/07/2020 3:07 PM

## 2020-05-07 NOTE — Progress Notes (Signed)
Blount,NP texted potassium result of 2.7,called to me from lab. Awaiting orders.

## 2020-05-07 NOTE — Progress Notes (Signed)
In and out cath was performed around 0600. Resistance was felt at first until penis was repositioned and then urine began to flow out of catheter. Urine output was measured at 1100 mL. Pt stated that he felt much better and RNs could visually see abdomen distention was resolved.   Larey Days, RN

## 2020-05-07 NOTE — Consult Note (Addendum)
Reason for Consult: Left shoulder pain Referring Physician: Dr. [name] is an 74 y.o. male.  HPI: The patient is a 74 year old male with a past medical history significant for stroke, smoking and schizophrenia.  He fell yesterday morning and was found down by a relative.  He complains of soreness at his left shoulder.  He denies any soreness or pain at his hips.  He reports that he walks around his house some.  He lives alone but has family check on him regularly.  By report he ambulates some around the home and his yard.  He smokes 1-1/2 packs of cigarettes per day.  He is not diabetic.  He denies numbness, tingling or weakness in the left upper extremity.  He denies any weakness in the left lower extremity.  His left shoulder hurts more with motion with sharp pain.  He only has some aching soreness when holding still.  He denies any history of injury or surgery to that left shoulder in the past.  Past Medical History:  Diagnosis Date  . Esophageal stricture   . Hypertension   . PTSD (post-traumatic stress disorder)   . Stroke [facility])     Past Surgical History:  Procedure Laterality Date  . MELANOMA EXCISION    . ORIF FEMUR FRACTURE- LISS PLATE      History reviewed. No pertinent family history.  Social History:  reports that he has been smoking. He has been smoking about 1.50 packs per day. He has never used smokeless tobacco. He reports that he does not drink alcohol and does not use drugs.  Allergies: No Known Allergies  Medications: I have reviewed the patient's current medications.  Results for orders placed or performed during the hospital encounter of 05/06/20 (from the past 48 hour(s))  Ethanol     Status: None   Collection Time: 05/06/20 10:20 AM  Result Value Ref Range   Alcohol, Ethyl (B) <10 <10 mg/dL    Comment: (NOTE) Lowest detectable limit for serum alcohol is 10 mg/dL.  For medical purposes only. Performed at [facility], [address]  [place], [place] [address]   CBC with Differential/Platelet     Status: Abnormal   Collection Time: 05/06/20 10:20 AM  Result Value Ref Range   WBC 18.1 (H) 4.0 - 10.5 K/uL   RBC 4.64 4.22 - 5.81 MIL/uL   Hemoglobin 13.8 13.0 - 17.0 g/dL   HCT 91.4 39 - 52 %   MCV 85.1 80.0 - 100.0 fL   MCH 29.7 26.0 - 34.0 pg   MCHC 34.9 30.0 - 36.0 g/dL   RDW 78.2 95.6 - 21.3 %   Platelets 229 150 - 400 K/uL   nRBC 0.0 0.0 - 0.2 %   Neutrophils Relative % 92 %   Neutro Abs 16.5 (H) 1.7 - 7.7 K/uL   Lymphocytes Relative 2 %   Lymphs Abs 0.4 (L) 0.7 - 4.0 K/uL   Monocytes Relative 5 %   Monocytes Absolute 1.0 0 - 1 K/uL   Eosinophils Relative 0 %   Eosinophils Absolute 0.0 0 - 0 K/uL   Basophils Relative 0 %   Basophils Absolute 0.0 0 - 0 K/uL   Immature Granulocytes 1 %   Abs Immature Granulocytes 0.12 (H) 0.00 - 0.07 K/uL    Comment: Performed at [facility], [address], [place], [place] [address]  Ammonia     Status: None   Collection Time: 05/06/20 10:21 AM  Result  Value Ref Range   Ammonia 20 9 - 35 umol/L    Comment: Performed at [facility], [address], [place], [place] [address]  Comprehensive metabolic panel     Status: Abnormal   Collection Time: 05/06/20 10:22 AM  Result Value Ref Range   Sodium 128 (L) 135 - 145 mmol/L   Potassium 3.2 (L) 3.5 - 5.1 mmol/L   Chloride 90 (L) 98 - 111 mmol/L   CO2 23 22 - 32 mmol/L   Glucose, Bld 129 (H) 70 - 99 mg/dL    Comment: Glucose reference range applies only to samples taken after fasting for at least 8 hours.   BUN 22 8 - 23 mg/dL   Creatinine, Ser 6.04 0.61 - 1.24 mg/dL   Calcium 8.8 (L) 8.9 - 10.3 mg/dL   Total Protein 6.7 6.5 - 8.1 g/dL   Albumin 3.7 3.5 - 5.0 g/dL   AST 59 (H) 15 - 41 U/L   ALT 31 0 - 44 U/L   Alkaline Phosphatase 65 38 - 126 U/L   Total Bilirubin 1.5 (H) 0.3 - 1.2 mg/dL   GFR calc non Af Amer >60 >60 mL/min   GFR calc Af Amer >60 >60 mL/min   Anion gap 15 5 - 15    Comment: Performed at  [facility], [address], [place], [place] [address]  Lactic acid, plasma     Status: Abnormal   Collection Time: 05/06/20 10:22 AM  Result Value Ref Range   Lactic Acid, Venous 2.3 (HH) 0.5 - 1.9 mmol/L    Comment: CRITICAL RESULT CALLED TO, READ BACK BY AND VERIFIED WITH: Thayer Dallas RN AT 1138 05/06/20 BY [facility] Performed at [facility], [address] [place], [place] [address]   Protime-INR     Status: None   Collection Time: 05/06/20 10:22 AM  Result Value Ref Range   Prothrombin Time 13.8 11.4 - 15.2 seconds   INR 1.1 0.8 - 1.2    Comment: (NOTE) INR goal varies based on device and disease states. Performed at [facility], [address] [place], [place] [address]   CK     Status: Abnormal   Collection Time: 05/06/20 10:22 AM  Result Value Ref Range   Total CK 2,126 (H) 49.0 - 397.0 U/L    Comment: Performed at [facility], [address] [place], [place] [address]  SARS Coronavirus 2 by RT PCR (hospital order, performed in [facility] hospital lab) Nasopharyngeal Nasopharyngeal Swab     Status: None   Collection Time: 05/06/20 10:24 AM   Specimen: Nasopharyngeal Swab  Result Value Ref Range   SARS Coronavirus 2 NEGATIVE NEGATIVE    Comment: (NOTE) SARS-CoV-2 target nucleic acids are NOT DETECTED.  The SARS-CoV-2 RNA is generally detectable in upper and lower respiratory specimens during the acute phase of infection. The lowest concentration of SARS-CoV-2 viral copies this assay can detect is 250 copies / mL. A negative result does not preclude SARS-CoV-2 infection and should not be used as the sole basis for treatment or other patient management decisions.  A negative result may occur with improper specimen collection / handling, submission of specimen other than nasopharyngeal swab, presence of viral mutation(s) within the areas targeted by this assay, and inadequate number of viral copies (<250 copies / mL). A negative result  must be combined with clinical observations, patient history, and epidemiological information.  Fact Sheet for Patients:   BoilerBrush.com.cy  Fact Sheet for Healthcare Providers: https://pope.com/  This test is not yet approved or  cleared by the [place] FDA and has been authorized for detection and/or diagnosis of SARS-CoV-2 by FDA under an Emergency Use Authorization (EUA).  This EUA will remain in effect (meaning this test can be used) for the duration of the COVID-19 declaration under Section 564(b)(1) of the Act, 21 U.S.C. section 360bbb-3(b)(1), unless the authorization is terminated or revoked sooner.  Performed at [facility], [address], [place], [place] [address]   Blood Cultures (routine x 2)     Status: None (Preliminary result)   Collection Time: 05/06/20 10:26 AM   Specimen: BLOOD  Result Value Ref Range   Specimen Description BLOOD SITE NOT SPECIFIED    Special Requests      BOTTLES DRAWN AEROBIC AND ANAEROBIC Blood Culture adequate volume   Culture      NO GROWTH 1 DAY Performed at [facility], [address], [place], [place] [address]    Report Status PENDING   Blood Cultures (routine x 2)     Status: None (Preliminary result)   Collection Time: 05/06/20 10:28 AM   Specimen: BLOOD  Result Value Ref Range   Specimen Description BLOOD SITE NOT SPECIFIED    Special Requests      BOTTLES DRAWN AEROBIC AND ANAEROBIC Blood Culture adequate volume   Culture      NO GROWTH 1 DAY Performed at [facility], [address] [place], [place] [address]    Report Status PENDING   Urinalysis, Complete w Microscopic     Status: Abnormal   Collection Time: 05/06/20 11:14 AM  Result Value Ref Range   Color, Urine YELLOW YELLOW   APPearance HAZY (A) CLEAR   Specific Gravity, Urine 1.017 1.005 - 1.030   pH 7.0 5.0 - 8.0   Glucose, UA 50 (A) NEGATIVE mg/dL   Hgb urine dipstick MODERATE (A)  NEGATIVE   Bilirubin Urine NEGATIVE NEGATIVE   Ketones, ur 20 (A) NEGATIVE mg/dL   Protein, ur 536 (A) NEGATIVE mg/dL   Nitrite POSITIVE (A) NEGATIVE   Leukocytes,Ua NEGATIVE NEGATIVE   RBC / HPF 6-10 0 - 5 RBC/hpf   WBC, UA 6-10 0 - 5 WBC/hpf   Bacteria, UA NONE SEEN NONE SEEN   Squamous Epithelial / LPF 0-5 0 - 5   Mucus PRESENT    Amorphous Crystal PRESENT     Comment: Performed at [facility], [address] [place], [place] [address]  Urine culture     Status: Abnormal (Preliminary result)   Collection Time: 05/06/20 11:14 AM   Specimen: Urine, Random  Result Value Ref Range   Specimen Description URINE, RANDOM    Special Requests NONE    Culture (A)     >=100,000 COLONIES/mL PSEUDOMONAS AERUGINOSA SUSCEPTIBILITIES TO FOLLOW Performed at [facility], [address], [place], [place] [address]    Report Status PENDING   Urine rapid drug screen (hosp performed)     Status: None   Collection Time: 05/06/20 11:14 AM  Result Value Ref Range   Opiates NONE DETECTED NONE DETECTED   Cocaine NONE DETECTED NONE DETECTED   Benzodiazepines NONE DETECTED NONE DETECTED   Amphetamines NONE DETECTED NONE DETECTED   Tetrahydrocannabinol NONE DETECTED NONE DETECTED   Barbiturates NONE DETECTED NONE DETECTED    Comment: (NOTE) DRUG SCREEN FOR MEDICAL PURPOSES ONLY.  IF CONFIRMATION IS NEEDED FOR ANY PURPOSE, NOTIFY LAB  WITHIN 5 DAYS.  LOWEST DETECTABLE LIMITS FOR URINE DRUG SCREEN Drug Class                     Cutoff (ng/mL) Amphetamine and metabolites    1000 Barbiturate and metabolites    200 Benzodiazepine                 200 Tricyclics and metabolites     300 Opiates and metabolites        300 Cocaine and metabolites        300 THC                            50 Performed at [facility], [address] [place], [place] [address]   CK     Status: Abnormal   Collection Time: 05/07/20  2:54 AM  Result Value Ref Range   Total CK 1,150 (H) 49.0 - 397.0  U/L    Comment: Performed at [facility], [address] [place], [place] [address]  Basic metabolic panel     Status: Abnormal   Collection Time: 05/07/20  2:54 AM  Result Value Ref Range   Sodium 132 (L) 135 - 145 mmol/L   Potassium 2.7 (LL) 3.5 - 5.1 mmol/L    Comment: CRITICAL RESULT CALLED TO, READ BACK BY AND VERIFIED WITH: MINTZ A,RN 05/07/20 0358 WAYK    Chloride 98 98 - 111 mmol/L   CO2 21 (L) 22 - 32 mmol/L   Glucose, Bld 95 70 - 99 mg/dL    Comment: Glucose reference range applies only to samples taken after fasting for at least 8 hours.   BUN 19 8 - 23 mg/dL   Creatinine, Ser 8.29 0.61 - 1.24 mg/dL   Calcium 7.8 (L) 8.9 - 10.3 mg/dL   GFR calc non Af Amer >60 >60 mL/min   GFR calc Af Amer >60 >60 mL/min   Anion gap 13 5 - 15    Comment: Performed at [facility], [address] [place], [place] [address]  CBC     Status: Abnormal   Collection Time: 05/07/20  2:54 AM  Result Value Ref Range   WBC 12.9 (H) 4.0 - 10.5 K/uL   RBC 3.98 (L) 4.22 - 5.81 MIL/uL   Hemoglobin 11.8 (L) 13.0 - 17.0 g/dL   HCT 08.6 (L) 39 - 52 %   MCV 85.9 80.0 - 100.0 fL   MCH 29.6 26.0 - 34.0 pg   MCHC 34.5 30.0 - 36.0 g/dL   RDW 57.8 46.9 - 62.9 %   Platelets 178 150 - 400 K/uL   nRBC 0.0 0.0 - 0.2 %    Comment: Performed at [facility], [address], [place], [place] [address]  Magnesium     Status: Abnormal   Collection Time: 05/07/20  2:54 AM  Result Value Ref Range   Magnesium 1.6 (L) 1.7 - 2.4 mg/dL    Comment: Performed at [facility], [address], [place], [place] [address]  Vitamin B12     Status: None   Collection Time: 05/07/20  9:33 AM  Result Value Ref Range   Vitamin B-12 654 180 - 914 pg/mL    Comment: (NOTE) This assay is not validated for testing neonatal or myeloproliferative syndrome specimens for Vitamin B12 levels. Performed at [facility], [address], [place], [place] [address]  TSH     Status: Abnormal    Collection Time: 05/07/20  9:33 AM  Result Value Ref Range   TSH 5.512 (H) 0.350 - 4.500 uIU/mL    Comment: Performed by a 3rd Generation assay with a functional sensitivity of <=0.01 uIU/mL. Performed at [facility], [address] [place], [place] [id number]     CT ANGIO HEAD W OR WO CONTRAST  Result Date: 05/07/2020 CLINICAL DATA:  Abnormal MRI EXAM: CT ANGIOGRAPHY HEAD AND NECK TECHNIQUE: Multidetector CT imaging of the head and neck was performed using the standard protocol during bolus administration of intravenous contrast. Multiplanar CT image reconstructions and MIPs were obtained to evaluate the vascular anatomy. Carotid stenosis measurements (when applicable) are obtained utilizing NASCET criteria, using the distal internal carotid diameter as the denominator. CONTRAST:  75mL OMNIPAQUE IOHEXOL 350 MG/ML SOLN COMPARISON:  Correlation made with recent prior imaging FINDINGS: CTA NECK Aortic arch: Calcified and irregular noncalcified plaque present along the arch and at the great vessel origins. Moderate stenosis of the proximal left subclavian artery. Right carotid system: Common carotid is patent with mild calcified plaque. There is calcified and noncalcified plaque at the carotid bifurcation extending along the proximal internal carotid. 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 Left carotid system: Marked stenosis at the origin with partial reconstitution and subsequent additional plaque with occlusion. There is reconstitution of the external carotid. Cervical internal carotid is occluded throughout the neck. Vertebral arteries: Patent. Calcified plaque at the left vertebral origin causes less than 50% stenosis. There is additional mild calcified plaque bilaterally. Both arteries appear prominent likely due to carotid occlusions. Skeleton: Mild degenerative changes the cervical spine. Other neck: Subcentimeter right thyroid nodule for which no  further follow-up is recommended by current guidelines. No mass or adenopathy. Upper chest: No apical lung mass. Review of the MIP images confirms the above findings CTA HEAD Anterior circulation: Reconstitution of the intracranial internal carotid arteries near the anterior clinoid processes. Anterior cerebral arteries are patent. Left A1 ACA is dominant. Anterior communicating artery is present. Middle cerebral arteries are patent. Posterior circulation: Intracranial vertebral arteries are patent with mild calcified plaque. Basilar artery is patent. Posterior cerebral arteries are patent. Bilateral posterior communicating arteries are present and likely contribute to reconstitution of internal carotids. There is segmental mild fusiform dilatation of the right posterior communicating artery (series 14, image 20). Venous sinuses: Patent as allowed by contrast bolus timing. 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 Occluded left CCA and left cervical ICA with some reconstitution of the supraclinoid portion intracranially. These findings are likely chronic. Patent posterior circulation with large vertebral arteries and patent posterior communicating arteries. There is segmental mild fusiform dilatation of the right posterior communicating artery possibly reflecting post-stenotic dilatation or flow related aneurysmal dilatation. Electronically Signed   By: Guadlupe SpanishPraneil  Patel M.D.   On: 05/07/2020 13:46   DG Forearm Left  Result Date: 05/07/2020 CLINICAL DATA:  Pt has AMS and is complaining of pain in left arm. He has significant amounts of bruising at the elbow around his IV. EXAM: LEFT FOREARM - 2 VIEW COMPARISON:  None. FINDINGS: There is no evidence of fracture or other focal bone lesions. Soft tissues are unremarkable. IMPRESSION: No acute osseous abnormality in the left  forearm. Electronically Signed   By: Emmaline KluverNancy  Ballantyne M.D.   On: 05/07/2020  16:18   CT HEAD WO CONTRAST  Result Date: 05/06/2020 CLINICAL DATA:  Ataxia.  Head trauma. EXAM: CT HEAD WITHOUT CONTRAST TECHNIQUE: Contiguous axial images were obtained from the base of the skull through the vertex without intravenous contrast. COMPARISON:  None. FINDINGS: Brain: Mild cerebral atrophy. Encephalomalacia involving the medial right frontal lobe. Evidence for encephalomalacia in the posterior left parietal lobe. Scattered areas of periventricular low density. Negative for acute hemorrhage, mass lesion, midline shift, hydrocephalus or large new infarct. Vascular: No hyperdense vessel or unexpected calcification. Skull: Normal. Negative for fracture or focal lesion. Sinuses/Orbits: Right mastoid sinus is opacified and there is a soft tissue lesion in the mastoid antrum obstructing the mastoid sinus and extending to the right middle ear ossicles. Findings are suggestive for a cholesteatoma. Visualized paranasal sinuses are unremarkable. Other: None. IMPRESSION: 1. Soft tissue lesion in the right mastoid antrum that is obstructing the right mastoid sinus and extending to the right middle ear ossicles. 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 2. Scattered areas of encephalomalacia and white matter disease in the brain. Findings are likely related to previous insults or infarcts. No acute intracranial abnormality. These results were called by telephone at the time of interpretation on 05/06/2020 at 12:08 pm to provider ABIGAIL HARRIS , who verbally acknowledged these results. Electronically Signed   By: Richarda Overlie M.D.   On: 05/06/2020 12:09   CT ANGIO NECK W OR WO CONTRAST  Result Date: 05/07/2020 CLINICAL DATA:  Abnormal MRI EXAM: CT ANGIOGRAPHY HEAD AND NECK TECHNIQUE: Multidetector CT imaging of the head and neck was performed using the standard protocol during bolus administration of intravenous contrast.  Multiplanar CT image reconstructions and MIPs were obtained to evaluate the vascular anatomy. Carotid stenosis measurements (when applicable) are obtained utilizing NASCET criteria, using the distal internal carotid diameter as the denominator. CONTRAST:  48mL OMNIPAQUE IOHEXOL 350 MG/ML SOLN COMPARISON:  Correlation made with recent prior imaging FINDINGS: CTA NECK Aortic arch: Calcified and irregular noncalcified plaque present along the arch and at the great vessel origins. Moderate stenosis of the proximal left subclavian artery. Right carotid system: Common carotid is patent with mild calcified plaque. There is calcified and noncalcified plaque at the carotid bifurcation extending along the proximal internal carotid. 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 Left carotid system: Marked stenosis at the origin with partial reconstitution and subsequent additional plaque with occlusion. There is reconstitution of the external carotid. Cervical internal carotid is occluded throughout the neck. Vertebral arteries: Patent. Calcified plaque at the left vertebral origin causes less than 50% stenosis. There is additional mild calcified plaque bilaterally. Both arteries appear prominent likely due to carotid occlusions. Skeleton: Mild degenerative changes the cervical spine. Other neck: Subcentimeter right thyroid nodule for which no further follow-up is recommended by current guidelines. No mass or adenopathy. Upper chest: No apical lung mass. Review of the MIP images confirms the above findings CTA HEAD Anterior circulation: Reconstitution of the intracranial internal carotid arteries near the anterior clinoid processes. Anterior cerebral arteries are patent. Left A1 ACA is dominant. Anterior communicating artery is present. Middle cerebral arteries are patent. Posterior circulation: Intracranial vertebral arteries are patent with mild calcified plaque. Basilar artery is patent.  Posterior cerebral arteries are patent. Bilateral posterior communicating arteries are present and likely contribute to reconstitution of internal carotids. There is segmental mild fusiform dilatation of the right posterior communicating artery (series 14, image 20). Venous sinuses: Patent as allowed by contrast bolus  timing. 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 Occluded left CCA and left cervical ICA with some reconstitution of the supraclinoid portion intracranially. These findings are likely chronic. Patent posterior circulation with large vertebral arteries and patent posterior communicating arteries. There is segmental mild fusiform dilatation of the right posterior communicating artery possibly reflecting post-stenotic dilatation or flow related aneurysmal dilatation. Electronically Signed   By: Guadlupe Spanish M.D.   On: 05/07/2020 13:46   CT Cervical Spine Wo Contrast  Result Date: 05/06/2020 CLINICAL DATA:  Fall. EXAM: CT CERVICAL SPINE WITHOUT CONTRAST TECHNIQUE: Multidetector CT imaging of the cervical spine was performed without intravenous contrast. Multiplanar CT image reconstructions were also generated. COMPARISON:  Head CT 05/06/2020 FINDINGS: Alignment: Normal. Skull base and vertebrae: No acute fracture. No primary bone lesion or focal pathologic process. Mild degenerative facet disease. Soft tissues and spinal canal: No prevertebral fluid or swelling. No visible canal hematoma. Disc levels: No significant disc space narrowing. Mild posterior disc space narrowing at C3-C4. Upper chest: Negative Other: Carotid artery calcifications. 9 mm low-density nodule in the right thyroid lobe. Visualized right mastoid sinus is opacified. IMPRESSION: No acute abnormality to the cervical spine. Electronically Signed   By: Richarda Overlie M.D.   On: 05/06/2020 12:18    MR BRAIN WO CONTRAST  Result Date: 05/06/2020 CLINICAL DATA:  Altered mental status and fall EXAM: MRI HEAD WITHOUT CONTRAST TECHNIQUE: Multiplanar, multiecho pulse sequences of the brain and surrounding structures were obtained without intravenous contrast. COMPARISON:  None. FINDINGS: Brain: There is no acute infarction or intracranial hemorrhage. There are chronic infarcts of the parasagittal right frontoparietal lobes (ACA territory) and left parietal lobe (MCA territory). There are some chronic blood products associated with the infarct on the right. Chronic small vessel infarcts of the right caudate and left corona radiata. Additional patchy and confluent areas of T2 hyperintensity in the supratentorial white matter nonspecific but may reflect moderate chronic microvascular ischemic changes. Prominence of the ventricles and sulci reflects generalized parenchymal volume loss. Small focus of susceptibility in the left frontal lobe is compatible with chronic microhemorrhage or less likely mineralization. There is no intracranial mass, mass effect, or edema. There is no hydrocephalus or extra-axial fluid collection. Vascular: Loss of the expected flow void of the intracranial internal carotid arteries to the level of the clinoid processes. Skull and upper cervical spine: Normal marrow signal is preserved. Sinuses/Orbits: Paranasal sinuses are aerated. Orbits are unremarkable. Other: Sella is unremarkable. Opacification of the right mastoid. There is some corresponding reduced diffusion. Ossicular erosion is evident on prior head CT. IMPRESSION: No acute infarction, hemorrhage, or mass. Loss of the normal flow voids of the intracranial internal carotid arteries, which may reflect slow flow or chronic occlusion. There appears to be reconstitution at the level of the clinoid processes. Multiple chronic infarcts.  Chronic microvascular ischemic changes. Abnormal appearance of the right mastoid and middle ear.  Correlating with prior CT, this is most consistent with a cholesteatoma. Electronically Signed   By: Guadlupe Spanish M.D.   On: 05/06/2020 19:31   DG Chest Port 1 View  Result Date: 05/06/2020 CLINICAL DATA:  Status post fall today.  Initial encounter. EXAM: PORTABLE CHEST 1 VIEW COMPARISON:  PA and lateral chest 10/12/2010. FINDINGS: The lungs are emphysematous but clear. Aortic atherosclerosis. Heart size normal. No pneumothorax or pleural effusion. No acute bony abnormality. IMPRESSION: No acute disease. Aortic  Atherosclerosis (ICD10-I70.0) and Emphysema (ICD10-J43.9). Electronically Signed   By: Drusilla Kanner M.D.   On: 05/06/2020 11:11   DG Humerus Left  Result Date: 05/07/2020 CLINICAL DATA:  Pt has AMS and is complaining of pain in left arm. He has significant amounts of bruising at the elbow around his IV. EXAM: LEFT HUMERUS - 2+ VIEW COMPARISON:  None. FINDINGS: There is an impacted and mildly comminuted fracture of the proximal left humerus. Regional soft tissues are unremarkable. IMPRESSION: Impacted and mildly comminuted fracture of the proximal left humerus. Electronically Signed   By: Emmaline Kluver M.D.   On: 05/07/2020 16:17   ECHOCARDIOGRAM COMPLETE  Result Date: 05/07/2020    ECHOCARDIOGRAM REPORT   Patient Name:   CORRION STIREWALT Date of Exam: 05/07/2020 Medical Rec #:  [id number]   Height:       65.0 in Accession #:    [id number]  Weight:       121.3 lb Date of Birth:  01/04/1946   BSA:          1.599 m Patient Age:    74 years    BP:           135/64 mmHg Patient Gender: M           HR:           74 bpm. Exam Location:  Inpatient Procedure: 2D Echo Indications:    785.2 murmur  History:        Patient has no prior history of Echocardiogram examinations.                 Stroke; Risk Factors:Hypertension and Current Smoker.  Sonographer:    Celene Skeen RDCS (AE) Referring Phys: [id number] [facility]  Sonographer Comments: No subcostal window. restricted mobility - left shoulder injured  IMPRESSIONS  1. Left ventricular ejection fraction, by estimation, is 60 to 65%. The left ventricle has normal function. The left ventricle has no regional wall motion abnormalities. Left ventricular diastolic parameters are indeterminate.  2. Right ventricular systolic function is normal. The right ventricular size is normal.  3. The mitral valve is normal in structure. Trivial mitral valve regurgitation.  4. The aortic valve is tricuspid. Aortic valve regurgitation is not visualized. Mild aortic valve sclerosis is present, with no evidence of aortic valve stenosis. FINDINGS  Left Ventricle: Left ventricular ejection fraction, by estimation, is 60 to 65%. The left ventricle has normal function. The left ventricle has no regional wall motion abnormalities. The left ventricular internal cavity size was normal in size. There is  no left ventricular hypertrophy. Left ventricular diastolic parameters are indeterminate. Right Ventricle: The right ventricular size is normal. Right vetricular wall thickness was not assessed. Right ventricular systolic function is normal. Left Atrium: Left atrial size was normal in size. Right Atrium: Right atrial size was normal in size. Pericardium: Trivial pericardial effusion is present. Mitral Valve: The mitral valve is normal in structure. Trivial mitral valve regurgitation. Tricuspid Valve: The tricuspid valve is normal in structure. Tricuspid valve regurgitation is mild. Aortic Valve: The aortic valve is tricuspid. Aortic valve regurgitation is not visualized. Mild aortic valve sclerosis is present, with no evidence of aortic valve stenosis. Pulmonic Valve: The pulmonic valve was grossly normal. Pulmonic valve regurgitation is not visualized. Aorta: The aortic root is normal in size and structure. Venous: The inferior vena cava was not well visualized. IAS/Shunts: No atrial level shunt detected by color flow Doppler.  LEFT VENTRICLE PLAX 2D LVIDd:  3.40 cm  Diastology LVIDs:          2.45 cm  LV e' lateral:   9.57 cm/s LV PW:         0.90 cm  LV E/e' lateral: 5.6 LV IVS:        1.00 cm  LV e' medial:    6.20 cm/s LVOT diam:     1.90 cm  LV E/e' medial:  8.6 LV SV:         57 LV SV Index:   35 LVOT Area:     2.84 cm  RIGHT VENTRICLE TAPSE (M-mode): 1.5 cm LEFT ATRIUM             Index       RIGHT ATRIUM           Index LA diam:        2.80 cm 1.75 cm/m  RA Area:     12.70 cm LA Vol (A2C):   26.8 ml 16.76 ml/m RA Volume:   22.30 ml  13.94 ml/m LA Vol (A4C):   23.6 ml 14.76 ml/m LA Biplane Vol: 25.7 ml 16.07 ml/m  AORTIC VALVE LVOT Vmax:   88.10 cm/s LVOT Vmean:  60.100 cm/s LVOT VTI:    0.200 m  AORTA Ao Root diam: 3.00 cm MITRAL VALVE MV Area (PHT): 2.32 cm    SHUNTS MV Decel Time: 327 msec    Systemic VTI:  0.20 m MV E velocity: 53.60 cm/s  Systemic Diam: 1.90 cm MV A velocity: 68.60 cm/s MV E/A ratio:  0.78 Dietrich Pates MD Electronically signed by Dietrich Pates MD Signature Date/Time: 05/07/2020/11:14:28 AM    Final    DG HIP UNILAT WITH PELVIS 2-3 VIEWS LEFT  Result Date: 05/06/2020 CLINICAL DATA:  Left hip pain. EXAM: DG HIP (WITH OR WITHOUT PELVIS) 2-3V LEFT COMPARISON:  Pelvis 10/12/2010 FINDINGS: Pelvic bony ring is intact. Left hip is located without a fracture. No gross abnormality to the right hip. Large amount of stool in the pelvis and right lower abdomen. Atherosclerotic calcifications. IMPRESSION: No acute bone abnormality in the pelvis or left hip. Large amount of stool in the abdomen and pelvis. Electronically Signed   By: Richarda Overlie M.D.   On: 05/06/2020 12:32   DG HIP UNILAT WITH PELVIS 2-3 VIEWS RIGHT  Result Date: 05/06/2020 CLINICAL DATA:  Fall. EXAM: DG HIP (WITH OR WITHOUT PELVIS) 2-3V RIGHT COMPARISON:  Left hip 05/06/2020 FINDINGS: Right hip is located without a fracture. Large amount of stool in the abdomen and pelvis. Visualized pelvic bony structures are intact. IMPRESSION: No acute bone abnormality to the right hip. Electronically Signed   By: Richarda Overlie M.D.   On: 05/06/2020 12:34    ROS: No recent fever, chills, nausea, vomiting or changes in his appetite.  10 system review is otherwise negative. PE:  Blood pressure 122/68, pulse 82, temperature 98.6 F (37 C), temperature source Oral, resp. rate 18, weight 55 kg, SpO2 96 %. Thin cachectic appearing male in no apparent distress.  Alert and oriented to person and place.  Extraocular motions are intact.  Edentulous.  Respirations are unlabored.  Left upper extremity has no gross deformity.  Slightly tender to palpation at the proximal humerus.  5 out of 5 strength in flexion and extension at the elbow and wrist.  Intact sensibility to light touch in the radial, ulnar and median nerve distributions.  2+ radial pulse.  No lymphadenopathy.  NTTP at either hip.  Active DF  and PF strength at the ankles bilat.  1+ DP pulses.  No pain with log roll of both hips.  Assessment/Plan: Left proximal humerus fracture -I explained the nature of the injury to the patient in detail.  We will immobilize him in a sling for comfort.  I will order a CT scan of his left shoulder.  Nonweightbearing on the left upper extremity.  Depending on the fracture pattern we will decide about the necessity of surgical treatment.  He understands the plan and agrees.  Based on the xrays, history and PE findings, I don't believe there is evidence of hip injury.  At this point I would allow the patient to bear weight as tolerated.  If he c/o hip pain when WB with PT, then additional imaging may be necessary.  I'll cancel the hip CT for now and write for PT and OT.  Toni Arthurs 05/07/2020, 7:08 PM

## 2020-05-07 NOTE — Progress Notes (Signed)
  Echocardiogram 2D Echocardiogram has been performed.  Celene Skeen 05/07/2020, 8:54 AM

## 2020-05-08 LAB — BASIC METABOLIC PANEL
Anion gap: 8 (ref 5–15)
BUN: 14 mg/dL (ref 8–23)
CO2: 25 mmol/L (ref 22–32)
Calcium: 7.5 mg/dL — ABNORMAL LOW (ref 8.9–10.3)
Chloride: 99 mmol/L (ref 98–111)
Creatinine, Ser: 0.53 mg/dL — ABNORMAL LOW (ref 0.61–1.24)
GFR calc Af Amer: >60 mL/min (ref >60)
GFR calc non Af Amer: >60 mL/min (ref >60)
Glucose, Bld: 94 mg/dL (ref 70–99)
Potassium: 3.2 mmol/L — ABNORMAL LOW (ref 3.5–5.1)
Sodium: 132 mmol/L — ABNORMAL LOW (ref 135–145)

## 2020-05-08 LAB — PHOSPHORUS: Phosphorus: 1.4 mg/dL — ABNORMAL LOW (ref 2.5–4.6)

## 2020-05-08 LAB — CBC
HCT: 31.9 % — ABNORMAL LOW (ref 39.0–52.0)
Hemoglobin: 10.8 g/dL — ABNORMAL LOW (ref 13.0–17.0)
MCH: 29.6 pg (ref 26.0–34.0)
MCHC: 33.9 g/dL (ref 30.0–36.0)
MCV: 87.4 fL (ref 80.0–100.0)
Platelets: 179 10*3/uL (ref 150–400)
RBC: 3.65 MIL/uL — ABNORMAL LOW (ref 4.22–5.81)
RDW: 13.4 % (ref 11.5–15.5)
WBC: 10.1 10*3/uL (ref 4.0–10.5)
nRBC: 0 % (ref 0.0–0.2)

## 2020-05-08 LAB — URINE CULTURE: Culture: >=100000 — AB

## 2020-05-08 LAB — MAGNESIUM: Magnesium: 2 mg/dL (ref 1.7–2.4)

## 2020-05-08 LAB — T4, FREE: Free T4: 1.18 ng/dL — ABNORMAL HIGH (ref 0.61–1.12)

## 2020-05-08 MED ORDER — SODIUM CHLORIDE 0.9 % IV SOLN
2.0000 g | Freq: Two times a day (BID) | INTRAVENOUS | Status: DC
  Administered 2020-05-08 – 2020-05-12 (×9): 2 g via INTRAVENOUS
  Filled 2020-05-08 (×8): qty 2

## 2020-05-08 MED ORDER — ADULT MULTIVITAMIN W/MINERALS CH
1.0000 | ORAL_TABLET | Freq: Every day | ORAL | Status: DC
  Administered 2020-05-08 – 2020-05-12 (×5): 1 via ORAL
  Filled 2020-05-08 (×5): qty 1

## 2020-05-08 MED ORDER — POTASSIUM CHLORIDE CRYS ER 20 MEQ PO TBCR
40.0000 meq | EXTENDED_RELEASE_TABLET | Freq: Four times a day (QID) | ORAL | Status: AC
  Administered 2020-05-08 (×2): 40 meq via ORAL
  Filled 2020-05-08 (×2): qty 2

## 2020-05-08 MED ORDER — K PHOS MONO-SOD PHOS DI & MONO 155-852-130 MG PO TABS
500.0000 mg | ORAL_TABLET | Freq: Three times a day (TID) | ORAL | Status: AC
  Administered 2020-05-08 (×3): 500 mg via ORAL
  Filled 2020-05-08 (×3): qty 2

## 2020-05-08 MED ORDER — ENSURE ENLIVE PO LIQD
237.0000 mL | Freq: Three times a day (TID) | ORAL | Status: DC
  Administered 2020-05-08 – 2020-05-12 (×11): 237 mL via ORAL

## 2020-05-08 NOTE — Plan of Care (Signed)
  Problem: Nutrition: Goal: Adequate nutrition will be maintained Outcome: Progressing   

## 2020-05-08 NOTE — Progress Notes (Signed)
Initial Nutrition Assessment  DOCUMENTATION CODES:   Severe malnutrition in context of chronic illness  INTERVENTION:   -Ensure Enlive po TID, each supplement provides 350 kcal and 20 grams of protein -MVI with minerals daily  NUTRITION DIAGNOSIS:   Severe Malnutrition related to chronic illness (CVA, esophageal stricture) as evidenced by moderate fat depletion, severe fat depletion, moderate muscle depletion, severe muscle depletion.  GOAL:   Patient will meet greater than or equal to 90% of their needs  MONITOR:   PO intake, Supplement acceptance, Labs, Weight trends, Skin, I & O's  REASON FOR ASSESSMENT:   Consult Assessment of nutrition requirement/status  ASSESSMENT:   74 year old with past medical history significant for hypertension, PTSD, schizophrenia presented with altered mental status and fall.  Pt admitted with rhadbomyolosis and metabolic encephalopathy.   Reviewed I/O's: -830 ml x 24 hours and -1.2 L since admission  UOP: 1.7 L x 24 hours  Per orthopedics notes, pt with lt humerus fx in sling; no plans for operative interventions at this time.   Spoke with pt, who was sitting in recliner char at time of visit. He reports feeling better, however, difficult to understand at times due to garbled speech.   Pt reports no changes in his appetite, stating, "I'm small no matter what I eat- I can't gain weight". He denies any recent wt loss. Per his report, UBW is around 10-110#, but does not remember the last time he was weighed.   Reviewed meal completion records; PO documented at 15-25%. Pt does not remember what he ate for breakfast. He does not have his dentures with him (pt with no teeth), however, denies any difficulty chewing foods without them. Will continue with regular diet for widest variety of food selections. PTA, he was consuming 3 meals per day (Breakfast: eggs and sausage; Lunch and Dinner: meat, starch, and vegetable from meals on wheels).    Discussed with pt importance of good meal and supplement intake to promote healing.   Medications reviewed.  Labs reviewed: Na: 132, K: 3.2, Phos: 1.4. Mg WDL.   NUTRITION - FOCUSED PHYSICAL EXAM:    Most Recent Value  Orbital Region Moderate depletion  Upper Arm Region Severe depletion  Thoracic and Lumbar Region Severe depletion  Buccal Region Severe depletion  Temple Region Severe depletion  Clavicle Bone Region Severe depletion  Clavicle and Acromion Bone Region Severe depletion  Scapular Bone Region Severe depletion  Dorsal Hand Moderate depletion  Patellar Region Severe depletion  Anterior Thigh Region Severe depletion  Posterior Calf Region Severe depletion  Edema (RD Assessment) None  Hair Reviewed  Eyes Reviewed  Mouth Reviewed  Skin Reviewed  Nails Reviewed     Diet Order:   Diet Order            Diet regular Room service appropriate? Yes; Fluid consistency: Thin  Diet effective now                 EDUCATION NEEDS:   Education needs have been addressed  Skin:  Skin Assessment: Skin Integrity Issues: Skin Integrity Issues:: Stage I, Stage II Stage I: sacrum Stage II: sacrum  Last BM:  05/07/20  Height:   Ht Readings from Last 1 Encounters:  02/16/12 5\' 5"  (1.651 m)    Weight:   Wt Readings from Last 1 Encounters:  05/07/20 58 kg    Ideal Body Weight:  61.8 kg  BMI:  Body mass index is 21.28 kg/m.  Estimated Nutritional Needs:   Kcal:  0017-4944  Protein:  100-115 grams  Fluid:  > 1.8 L    Levada Schilling, RD, LDN, CDCES Registered Dietitian II Certified Diabetes Care and Education Specialist Please refer to [facility] for RD and/or RD on-call/weekend/after hours pager

## 2020-05-08 NOTE — Evaluation (Signed)
Occupational Therapy Evaluation Patient Details Name: Lansing Sigmon MRN: [id number] DOB: 03-03-1946 Today's Date: 05/08/2020    History of Present Illness Niels Cranshaw is a 74 y.o. male with medical history significant of HTN,  PTSD, schizophrenia, presented with altered mental status and fall.  Patient [place], niece lives close, and neighbor who visit him 1-2 times a week.  Niece visited him this morning found him on the floor, lying down, awake and confused. Unsure how long he was down; found to have Rhabdomyolysis   Clinical Impression   PTA, pt lives alone at home and reports Modified Independence with ADLs, simple IADLs in the home, and mobility with RW. Pt presents now with diagnoses above and new acute fracture to L proximal humerus. Pt requires extensive verbal cues to follow L UE precautions throughout session, though pt aware of L UE injury. Pt requires Setup to Mod A for UB ADLs and Max A to Total A for LB ADLs due to deficits mentioned below. Pt required Max A for bed mobility and Max A for squat pivot to recliner. Based on CLOF and difficulty maintaining L UE precautions, pt would benefit from short term rehab prior to DC home.     Follow Up Recommendations  SNF    Equipment Recommendations  3 in 1 bedside commode    Recommendations for Other Services       Precautions / Restrictions Precautions Precautions: Fall;Other (comment) Precaution Comments: L UE sling Restrictions Weight Bearing Restrictions: Yes LUE Weight Bearing: Non weight bearing      Mobility Bed Mobility Overal bed mobility: Needs Assistance Bed Mobility: Supine to Sit     Supine to sit: Max assist;HOB elevated     General bed mobility comments: Max A with frequent cues to avoid L UE use, guided pt to reach to bed rail with R UE. Difficulty scooting EOB and maintaining sitting balance  Transfers Overall transfer level: Needs assistance Equipment used: 1 person hand held  assist;None Transfers: Sit to/from [employer] Sit to Stand: Max assist   Squat pivot transfers: Max assist     General transfer comment: Max A for sit to stand, unable to achieve full stance with OT guiding pt in squat pivot to recliner and blocking L knee    Balance Overall balance assessment: Needs assistance Sitting-balance support: Single extremity supported;Feet supported Sitting balance-Leahy Scale: Poor Sitting balance - Comments: unable to achieve static sitting balance without UE support, required Min -Mod A to maintain with frequent cues to avoid using L UE   Standing balance support: Single extremity supported Standing balance-Leahy Scale: Zero Standing balance comment: Max A to maintain standing balance                           ADL either performed or assessed with clinical judgement   ADL Overall ADL's : Needs assistance/impaired Eating/Feeding: Set up;Sitting   Grooming: Set up;Sitting   Upper Body Bathing: Moderate assistance;Sitting   Lower Body Bathing: Maximal assistance;Sit to/from stand   Upper Body Dressing : Moderate assistance;Sitting Upper Body Dressing Details (indicate cue type and reason): Mod A for UB dressing to don gown Lower Body Dressing: Maximal assistance;Sit to/from stand Lower Body Dressing Details (indicate cue type and reason): Instructed pt in one handed technique to don socks with pt able to demonstrate with Min A and verbal cues. Pt will require increased assist for LB dressing in standing due to balance/NWB L  UE Toilet Transfer: Maximal assistance;Squat-pivot;BSC Toilet Transfer Details (indicate cue type and reason): simulated to recliner Toileting- Clothing Manipulation and Hygiene: Total assistance;Bed level Toileting - Clothing Manipulation Details (indicate cue type and reason): Total A after bowel incontinence       General ADL Comments: Pt limited in ADL independence due to deficits in cognition  (unable to follow L UE precautions despite max cues), decreased strength, sitting/standing balance, endurance     Vision Baseline Vision/History: No visual deficits Vision Assessment?: No apparent visual deficits     Perception     Praxis      Pertinent Vitals/Pain Pain Assessment: No/denies pain     Hand Dominance Right   Extremity/Trunk Assessment Upper Extremity Assessment Upper Extremity Assessment: Generalized weakness;LUE deficits/detail LUE Deficits / Details: L UE NWB, sling, hand wrist elbow ROM WFL LUE: Unable to fully assess due to immobilization   Lower Extremity Assessment Lower Extremity Assessment: Defer to PT evaluation       Communication Communication Communication: Other (comment) (increased time to answer questions)   Cognition Arousal/Alertness: Awake/alert Behavior During Therapy: Flat affect Overall Cognitive Status: Impaired/Different from baseline Area of Impairment: Orientation;Attention;Memory;Following commands;Safety/judgement;Awareness;Problem solving                 Orientation Level: Time (reports June 2003) Current Attention Level: Sustained Memory: Decreased recall of precautions;Decreased short-term memory Following Commands: Follows one step commands with increased time Safety/Judgement: Decreased awareness of safety;Decreased awareness of deficits Awareness: Emergent Problem Solving: Slow processing;Difficulty sequencing;Requires verbal cues;Requires tactile cues General Comments: Pt oriented to person, place and situation. Pt requires consistent multimodal cues to avoid using L UE throughout session, unable to follow precautions   General Comments  HR WFL. Pt reporting dizziness sitting EOB that improved over time. Pt aware that L shoulder fractured as he mentioned frequently during session, but unable to follow through on precautions    Exercises     Shoulder Instructions      Home Living Family/patient expects to be  discharged to:: Private residence Living Arrangements: Alone Available Help at Discharge: Family;Friend(s);Available PRN/intermittently Type of Home: House Home Access: Stairs to enter [employer] of Steps: 3   Home Layout: One level     Bathroom Shower/Tub: Tub/shower unit         Home Equipment: Environmental consultant - 2 wheels;Shower seat   Additional Comments: Unsure if home setup 100% accurate as pt poor historian       Prior Functioning/Environment Level of Independence: Independent with assistive device(s)        Comments: Pt reports Modified Independence with RW, completing ADLs and simple IADLs in the home (microwave meals).        OT Problem List: Decreased strength;Decreased activity tolerance;Impaired balance (sitting and/or standing);Decreased coordination;Decreased cognition;Decreased safety awareness;Decreased knowledge of use of DME or AE;Decreased knowledge of precautions;Impaired UE functional use      OT Treatment/Interventions: Self-care/ADL training;Therapeutic exercise;Energy conservation;DME and/or AE instruction;Therapeutic activities;Patient/family education    OT Goals(Current goals can be found in the care plan section) Acute Rehab OT Goals Patient Stated Goal: pt motivated to get to recliner chair OT Goal Formulation: With patient Time For Goal Achievement: 05/22/20 Potential to Achieve Goals: Good ADL Goals Pt Will Perform Upper Body Bathing: with min assist;sitting Pt Will Perform Lower Body Bathing: with mod assist;sitting/lateral leans;sit to/from stand Pt Will Perform Upper Body Dressing: with min assist;sitting Pt Will Perform Lower Body Dressing: with mod assist;sitting/lateral leans;sit to/from stand Pt Will Transfer to Toilet: with mod assist;stand pivot  transfer;bedside commode Additional ADL Goal #1: Pt to demonstrate ability to maintain sitting balance EOB > 5 minutes with no more than supervision required in order to maximize ADL  performance Additional ADL Goal #2: Pt to demonstrate ability to follow L UE precautions with no more than min verbal cues required to maximize safety and healing.  OT Frequency: Min 2X/week   Barriers to D/C:            Co-evaluation              AM-PAC OT "6 Clicks" Daily Activity     Outcome Measure Help from another person eating meals?: A Little Help from another person taking care of personal grooming?: A Little Help from another person toileting, which includes using toliet, bedpan, or urinal?: Total Help from another person bathing (including washing, rinsing, drying)?: A Lot Help from another person to put on and taking off regular upper body clothing?: A Lot Help from another person to put on and taking off regular lower body clothing?: A Lot 6 Click Score: 13   End of Session Equipment Utilized During Treatment: Gait belt Nurse Communication: Mobility status  Activity Tolerance: Patient tolerated treatment well Patient left: in chair;with call bell/phone within reach;with chair alarm set  OT Visit Diagnosis: Unsteadiness on feet (R26.81);Other abnormalities of gait and mobility (R26.89);Muscle weakness (generalized) (M62.81);History of falling (Z91.81)                Time: 4081-4481 OT Time Calculation (min): 25 min Charges:  OT General Charges $OT Visit: 1 Visit OT Evaluation $OT Eval Moderate Complexity: 1 Mod OT Treatments $Self Care/Home Management : 8-22 mins  Lorre Munroe, OTR/L  Lorre Munroe 05/08/2020, 11:33 AM

## 2020-05-08 NOTE — Progress Notes (Signed)
Pharmacy Antibiotic Note  Alan [name] is a 74 y.o. male admitted on 05/06/2020 with UTI.  Pharmacy has been consulted for Cefepime dosing.  ID: UA pos nitrite, WBC 10.1 down. Afeb. LA 2.3 (7/17)  Antimicrobials this admission:  Ceftriaxone 7/17 >> 7/18 Cefepime 7/18 >>   Microbiology results:  7/17 BCx: NG x2 7/17 UCx: Pseudomonas (hx of pan sens Pseudo in 2013)  Plan: Increase Cefepime to 2g IV q12 Pharmacy will sign off. Please reconsult for further dosing assitance.     Weight: 58 kg (127 lb 13.9 oz)  Temp (24hrs), Avg:98.6 F (37 C), Min:97.9 F (36.6 C), Max:99.1 F (37.3 C)  Recent Labs  Lab 05/06/20 1020 05/06/20 1022 05/07/20 0254 05/08/20 0558  WBC 18.1*  --  12.9* 10.1  CREATININE  --  0.63 0.61 0.53*  LATICACIDVEN  --  2.3*  --   --     CrCl cannot be calculated (Unknown ideal weight.).    No Known Allergies  Alan [name] S. Merilynn Finland, PharmD, BCPS Clinical Staff Pharmacist [email or web address]  Pasty Spillers 05/08/2020 12:21 PM

## 2020-05-08 NOTE — Progress Notes (Signed)
Subjective: CT scan reviewed.  Pt has an impacted fracture of the proximal humerus without significant displacement.  PT notes reviewed.  Pt tolerated WB with rolling walker but required mod assist.  SNF recommended for rehab.   Objective: Vital signs in last 24 hours: Temp:  [97.9 F (36.6 C)-99.1 F (37.3 C)] 97.9 F (36.6 C) (07/19 0904) Pulse Rate:  [73-88] 88 (07/19 0438) Resp:  [14-18] 18 (07/19 0904) BP: (122-167)/(57-80) 167/80 (07/19 0904) SpO2:  [92 %-100 %] 100 % (07/19 0904) Weight:  [58 kg] 58 kg (07/18 2048)  Intake/Output from previous day: 07/18 0701 - 07/19 0700 In: 820 [P.O.:820] Out: 1650 [Urine:1650] Intake/Output this shift: Total I/O In: 1025.5 [P.O.:240; I.V.:785.5] Out: -   Recent Labs    05/06/20 1020 05/07/20 0254 05/08/20 0558  HGB 13.8 11.8* 10.8*   Recent Labs    05/07/20 0254 05/08/20 0558  WBC 12.9* 10.1  RBC 3.98* 3.65*  HCT 34.2* 31.9*  PLT 178 179   Recent Labs    05/07/20 0254 05/08/20 0558  NA 132* 132*  K 2.7* 3.2*  CL 98 99  CO2 21* 25  BUN 19 14  CREATININE 0.61 0.53*  GLUCOSE 95 94  CALCIUM 7.8* 7.5*   Recent Labs    05/06/20 1022  INR 1.1        Assessment/Plan: L proximal humerus fracture - this injury can be treated safely in closed fashion.  Sling for immobilization.  Follow up in the office in two weeks for repeat films.  If stable then we'll advance ROM.  I'll sign off now.  F/u instructions in Epic.  Call [phone] with any questions.   Toni Arthurs 05/08/2020, 10:53 AM

## 2020-05-08 NOTE — Progress Notes (Signed)
PROGRESS NOTE    Alan [name]  UXN:[id number] DOB: 09-19-1946 DOA: 05/06/2020 PCP: Barbie Banner, MD   Brief Narrative: 74 year old with past medical history significant for hypertension, PTSD, schizophrenia presented with altered mental status and fall.  Patient was very confused in the ED most of the history was provided by patient niece who was at bedside.  Patient [place], niece visits him 1 or twice a week.  Patient was found on the floor by his niece.  He was awake and confused. Patient  Report that he fell down on Monday.  At baseline patient is able to ambulate with the assistance of a walker.  Patient appears more confused than his baseline.  Patient reported generalized weakness before he fell down. Evaluation in the ED UA consistent with infection, positive nitrates, white blood cells 6-10.  White blood cell 18.  CT showed cholesteatoma.     Assessment & Plan:   Active Problems:   Urinary tract infection without hematuria   Rhabdomyolysis   Pressure injury of skin  1-Rhabdomyolysis;  Nontraumatic, due to prolonged immobilization. Continue with IV fluids. Follow CK level trend. 2126----1150 Repeat CK in am.   2-Acute metabolic encephalopathy Patient was alert oriented to place and name. Continue to correct electrolyte abnormalities. Continue with treatment of urinary tract infection Improving.   3-UTI, Pseudomonas, urinary retention: Patient presents with leukocytosis.  UA with positive nitrates, white blood cell 6-10. Urine culture growing Pseudomonas. Continue with cefepime day 2.  Blood cultures -No growth today.  Hypomagnesemia: Replaced.  Hypokalemia, replete with oral  Hypophosphatemia; replete orally.   Heart murmur; no significant valvular disease 2D ECHO.   Left arm weakness, left LE weakness;  -Patient with lot of bruise the left arm and unable to move it.  X ray ordered showed proximal humerus fracture.  -Discuss care with  neurologist, in regards lower extremity weakness will need to first correct electrolyte derangement, if weakness persist will need formal neurology evaluation. Weakness improved.  -MRI: Negative for acute stroke. Loss of the normal flow voids of the intracranial internal carotid arteries, which may reflect slow flow or chronic occlusion. 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 These findings are likely chronic.There is segmental mild fusiform dilatation of the right posterior communicating artery possibly reflecting post-stenotic dilatation or flow related aneurysmal dilatation. -Discussed CTA finding with neurology, recommend out patient vascular follow up outpatient.   Hyponatremia; continue with IV fluids.  Likely related to hypovolemia.  Elevated TSH;  Free T 4; 1.18  Needs repeat Thyroid function in 4 weeks.   Moderate protein caloric malnutrition; start Ensure  Cholesteatoma of right ear: Case discussed with on-call ENT Dr. Jenne Pane, recommend outpatient follow-up  Hypertension; continue with Norvasc, Coreg  Schizophrenia; continue with Risperdal   Pressure Ulcer 02/16/12 Stage I -  Intact skin with non-blanchable redness of a localized area usually over a bony prominence. Red (Active)  02/16/12 2050  Location: Sacrum  Location Orientation:   Staging: Stage I -  Intact skin with non-blanchable redness of a localized area usually over a bony prominence.  Wound Description (Comments): Red  Present on Admission: Yes     Pressure Injury 05/06/20 Sacrum  Mid Stage 2 -  Partial thickness loss of dermis presenting as a shallow open injury with  a red, pink wound bed without slough. Partial loss of dermis,shallow red wound bed with scant serous drainage. (Active)  05/06/20 1600  Location: Sacrum  Location Orientation: Mid  Staging: Stage 2 -  Partial thickness loss of dermis presenting as a shallow open injury with a red, pink wound bed without slough.  Wound Description (Comments): Partial loss of dermis,shallow red wound bed with scant serous drainage.  Present on Admission: Yes     Nutrition Problem: Severe Malnutrition Etiology: chronic illness (CVA, esophageal stricture)    Signs/Symptoms: moderate fat depletion, severe fat depletion, moderate muscle depletion, severe muscle depletion    Interventions: Ensure Enlive (each supplement provides 350kcal and 20 grams of protein), MVI  Estimated body mass index is 21.28 kg/m as calculated from the following:   Height as of 02/16/12:  (1.651 m).   Weight as of this encounter: 58 kg.   DVT prophylaxis: Lovenox Code Status: Full code Family Communication: No family at bedside Disposition Plan:  Status is: Inpatient  Remains inpatient appropriate because:Hemodynamically unstable and Persistent severe electrolyte disturbances   Dispo: The patient is from: Home              Anticipated d/c is to: To be determined              Anticipated d/c date is: 2 days              Patient currently is not medically stable to d/c.        Consultants:     Procedures:   Echo:  Antimicrobials:    Subjective: Patient feels ok, left arm pain controlled.  Left LE weakness improved.   Objective: Vitals:   05/07/20 1742 05/07/20 2048 05/08/20 0438 05/08/20 0904  BP: 122/68 (!) 132/57 (!) 143/71 (!) 167/80  Pulse: 82 73 88   Resp: Temp: 98.6 F (37 C) 98.9 F (37.2 C) 99.1 F (37.3 C) 97.9 F (36.6 C)  TempSrc: Oral Oral Oral Axillary  SpO2: 96% 95% 92% 100%  Weight:  58 kg      Intake/Output Summary (Last 24 hours) at 05/08/2020 1338 Last  data filed at 05/08/2020 1259 Gross per 24 hour  Intake 1625.45 ml  Output 2050 ml  Net -424.55 ml   Filed Weights   05/06/20 1931 05/07/20 2048  Weight: 55 kg 58 kg    Examination:  General exam: thin appearing,  Respiratory system: CTA Cardiovascular system: S 1, S 2 RRR Gastrointestinal system: BS present, soft, nt Central nervous system: alert, left arm on sling. BL LE 4/5 improved.  Extremities: left arm sling    Data Reviewed: I have personally reviewed following labs and imaging studies  CBC: Recent Labs  Lab 05/06/20 1020 05/07/20 0254 05/08/20 0558  WBC 18.1* 12.9* 10.1  NEUTROABS 16.5*  --   --   HGB 13.8 11.8* 10.8*  HCT 39.5 34.2* 31.9*  MCV 85.1 85.9 87.4  PLT 229 178 179   Basic Metabolic Panel: Recent Labs  Lab 05/06/20 1022 05/07/20 0254 05/08/20 0558  NA 128* 132* 132*  K 3.2* 2.7* 3.2*  CL 90* 98 99  CO2 23 21* 25  GLUCOSE 129* 95 94  BUN CREATININE 0.63 0.61 0.53*  CALCIUM 8.8* 7.8* 7.5*  MG  --  1.6* 2.0  PHOS  --   --  1.4*  GFR: CrCl cannot be calculated (Unknown ideal weight.). Liver Function Tests: Recent Labs  Lab 05/06/20 1022  AST 59*  ALT 31  ALKPHOS 65  BILITOT 1.5*  PROT 6.7  ALBUMIN 3.7   No results for input(s): LIPASE, AMYLASE in the last 168 hours. Recent Labs  Lab 05/06/20 1021  AMMONIA 20   Coagulation Profile: Recent Labs  Lab 05/06/20 1022  INR 1.1   Cardiac Enzymes: Recent Labs  Lab 05/06/20 1022 05/07/20 0254  CKTOTAL 2,126* 1,150*   BNP (last 3 results) No results for input(s): PROBNP in the last 8760 hours. HbA1C: No results for input(s): HGBA1C in the last 72 hours. CBG: No results for input(s): GLUCAP in the last 168 hours. Lipid Profile: No results for input(s): CHOL, HDL, LDLCALC, TRIG, CHOLHDL, LDLDIRECT in the last 72 hours. Thyroid Function Tests: Recent Labs    05/07/20 0933 05/08/20 0558  TSH 5.512*  --   FREET4  --  1.18*   Anemia Panel: Recent Labs     05/07/20 0933  VITAMINB12 654   Sepsis Labs: Recent Labs  Lab 05/06/20 1022  LATICACIDVEN 2.3*    Recent Results (from the past 240 hour(s))  SARS Coronavirus 2 by RT PCR (hospital order, performed in [facility] hospital lab) Nasopharyngeal Nasopharyngeal Swab     Status: None   Collection Time: 05/06/20 10:24 AM   Specimen: Nasopharyngeal Swab  Result Value Ref Range Status   SARS Coronavirus 2 NEGATIVE NEGATIVE Final    Comment: (NOTE) SARS-CoV-2 target nucleic acids are NOT DETECTED.  The SARS-CoV-2 RNA is generally detectable in upper and lower respiratory specimens during the acute phase of infection. The lowest concentration of SARS-CoV-2 viral copies this assay can detect is 250 copies / mL. A negative result does not preclude SARS-CoV-2 infection and should not be used as the sole basis for treatment or other patient management decisions.  A negative result may occur with improper specimen collection / handling, submission of specimen other than nasopharyngeal swab, presence of viral mutation(s) within the areas targeted by this assay, and inadequate number of viral copies (<250 copies / mL). A negative result must be combined with clinical observations, patient history, and epidemiological information.  Fact Sheet for Patients:   BoilerBrush.com.cy  Fact Sheet for Healthcare Providers: https://pope.com/  This test is not yet approved or  cleared by the [place] FDA and has been authorized for detection and/or diagnosis of SARS-CoV-2 by FDA under an Emergency Use Authorization (EUA).  This EUA will remain in effect (meaning this test can be used) for the duration of the COVID-19 declaration under Section 564(b)(1) of the Act, 21 U.S.C. section 360bbb-3(b)(1), unless the authorization is terminated or revoked sooner.  Performed at [facility], [address] [place], [place] [address]   Blood Cultures  (routine x 2)     Status: None (Preliminary result)   Collection Time: 05/06/20 10:26 AM   Specimen: BLOOD  Result Value Ref Range Status   Specimen Description BLOOD SITE NOT SPECIFIED  Final   Special Requests   Final    BOTTLES DRAWN AEROBIC AND ANAEROBIC Blood Culture adequate volume   Culture   Final    NO GROWTH 1 DAY Performed at [facility], [address] [place], [place] [address]    Report Status PENDING  Incomplete  Blood Cultures (routine x 2)     Status: None (Preliminary result)   Collection Time: 05/06/20 10:28 AM   Specimen: BLOOD  Result Value Ref Range Status   Specimen Description BLOOD SITE NOT SPECIFIED  Final   Special Requests   Final    BOTTLES DRAWN AEROBIC AND ANAEROBIC Blood Culture adequate volume   Culture   Final    NO GROWTH 1 DAY Performed at [facility], [address], [place], [place] [address]    Report Status PENDING  Incomplete  Urine culture     Status: Abnormal   Collection Time: 05/06/20 11:14 AM   Specimen: Urine, Random  Result Value Ref Range Status   Specimen Description URINE, RANDOM  Final   Special Requests   Final    NONE Performed at [facility], [address] [place], [place] [address]    Culture >=100,000 COLONIES/mL PSEUDOMONAS AERUGINOSA (A)  Final   Report Status 05/08/2020 FINAL  Final   Organism ID, Bacteria PSEUDOMONAS AERUGINOSA (A)  Final      Susceptibility   Pseudomonas aeruginosa - MIC*    CEFTAZIDIME 4 SENSITIVE Sensitive     CIPROFLOXACIN <=0.25 SENSITIVE Sensitive     GENTAMICIN <=1 SENSITIVE Sensitive     IMIPENEM 8 INTERMEDIATE Intermediate     PIP/TAZO <=4 SENSITIVE Sensitive     CEFEPIME 2 SENSITIVE Sensitive     * >=100,000 COLONIES/mL PSEUDOMONAS AERUGINOSA         Radiology Studies: CT ANGIO HEAD W OR WO CONTRAST  Result Date: 05/07/2020 CLINICAL DATA:  Abnormal MRI EXAM: CT ANGIOGRAPHY HEAD AND NECK TECHNIQUE: Multidetector CT imaging of the head and neck was  performed using the standard protocol during bolus administration of intravenous contrast. Multiplanar CT image reconstructions and MIPs were obtained to evaluate the vascular anatomy. Carotid stenosis measurements (when applicable) are obtained utilizing NASCET criteria, using the distal internal carotid diameter as the denominator. CONTRAST:  75mL OMNIPAQUE IOHEXOL 350 MG/ML SOLN COMPARISON:  Correlation made with recent prior imaging FINDINGS: CTA NECK Aortic arch: Calcified and irregular noncalcified plaque present along the arch and at the great vessel origins. Moderate stenosis of the proximal left subclavian artery. Right carotid system: Common carotid is patent with mild calcified plaque. There is calcified and noncalcified plaque at the carotid bifurcation extending along the proximal internal carotid. 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 Left carotid system: Marked stenosis at the origin with partial reconstitution and subsequent additional plaque with occlusion. There is reconstitution of the external carotid. Cervical internal carotid is occluded throughout the neck. Vertebral arteries: Patent. Calcified plaque at the left vertebral origin causes less than 50% stenosis. There is additional mild calcified plaque bilaterally. Both arteries appear prominent likely due to carotid occlusions. Skeleton: Mild degenerative changes the cervical spine. Other neck: Subcentimeter right thyroid nodule for which no further follow-up is recommended by current guidelines. No mass or adenopathy. Upper chest: No apical lung mass. Review of the MIP images confirms the above findings CTA HEAD Anterior circulation: Reconstitution of the intracranial internal carotid arteries near the anterior clinoid processes. Anterior cerebral arteries are patent. Left A1 ACA is dominant. Anterior communicating artery is present. Middle cerebral arteries are patent. Posterior circulation:  Intracranial vertebral arteries are patent with mild calcified plaque. Basilar artery is patent. Posterior cerebral arteries are patent. Bilateral posterior communicating arteries are present and likely contribute to reconstitution of internal carotids. There is segmental mild fusiform dilatation of the right posterior communicating artery (series 14, image 20). Venous sinuses: Patent as allowed by contrast bolus timing. 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 Occluded left CCA and left cervical ICA with some reconstitution of the supraclinoid portion intracranially. These findings are likely chronic. Patent posterior circulation with large vertebral arteries and patent posterior communicating arteries. There is segmental mild fusiform dilatation of the right posterior communicating artery possibly reflecting post-stenotic dilatation or flow related aneurysmal dilatation. Electronically Signed   By: Guadlupe SpanishPraneil  Patel M.D.   On: 05/07/2020 13:46   DG Forearm Left  Result Date: 05/07/2020 CLINICAL DATA:  Pt has AMS and is complaining of pain in left arm. He has significant amounts of bruising at the elbow around his IV. EXAM: LEFT FOREARM - 2 VIEW COMPARISON:  None. FINDINGS: There is no evidence of fracture or other focal bone lesions. Soft tissues are unremarkable. IMPRESSION: No acute osseous abnormality in the left forearm. Electronically Signed   By: Emmaline KluverNancy  Ballantyne M.D.   On: 05/07/2020 16:18   CT ANGIO NECK W OR WO CONTRAST  Result Date: 05/07/2020 CLINICAL DATA:  Abnormal MRI EXAM: CT ANGIOGRAPHY HEAD AND NECK TECHNIQUE: Multidetector CT imaging of the head and neck was performed using the standard protocol during bolus administration of intravenous contrast. Multiplanar CT image reconstructions and MIPs were obtained to evaluate the vascular anatomy. Carotid  stenosis measurements (when applicable) are obtained utilizing NASCET criteria, using the distal internal carotid diameter as the denominator. CONTRAST:  75mL OMNIPAQUE IOHEXOL 350 MG/ML SOLN COMPARISON:  Correlation made with recent prior imaging FINDINGS: CTA NECK Aortic arch: Calcified and irregular noncalcified plaque present along the arch and at the great vessel origins. Moderate stenosis of the proximal left subclavian artery. Right carotid system: Common carotid is patent with mild calcified plaque. There is calcified and noncalcified plaque at the carotid bifurcation extending along the proximal internal carotid. 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 Left carotid system: Marked stenosis at the origin with partial reconstitution and subsequent additional plaque with occlusion. There is reconstitution of the external carotid. Cervical internal carotid is occluded throughout the neck. Vertebral arteries: Patent. Calcified plaque at the left vertebral origin causes less than 50% stenosis. There is additional mild calcified plaque bilaterally. Both arteries appear prominent likely due to carotid occlusions. Skeleton: Mild degenerative changes the cervical spine. Other neck: Subcentimeter right thyroid nodule for which no further follow-up is recommended by current guidelines. No mass or adenopathy. Upper chest: No apical lung mass. Review of the MIP images confirms the above findings CTA HEAD Anterior circulation: Reconstitution of the intracranial internal carotid arteries near the anterior clinoid processes. Anterior cerebral arteries are patent. Left A1 ACA is dominant. Anterior communicating artery is present. Middle cerebral arteries are patent. Posterior circulation: Intracranial vertebral arteries are patent with mild calcified plaque. Basilar artery is patent. Posterior cerebral arteries are patent. Bilateral posterior communicating arteries are present and  likely contribute to reconstitution of internal carotids. There is segmental mild fusiform dilatation of the right posterior communicating artery (series 14, image 20). Venous sinuses: Patent as allowed by contrast bolus timing. 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 Occluded left CCA and left cervical ICA with some reconstitution of the supraclinoid portion intracranially. These findings are likely chronic. Patent posterior circulation with large vertebral arteries and patent posterior communicating arteries. There is segmental mild fusiform dilatation of the  right posterior communicating artery possibly reflecting post-stenotic dilatation or flow related aneurysmal dilatation. Electronically Signed   By: Guadlupe Spanish M.D.   On: 05/07/2020 13:46   MR BRAIN WO CONTRAST  Result Date: 05/06/2020 CLINICAL DATA:  Altered mental status and fall EXAM: MRI HEAD WITHOUT CONTRAST TECHNIQUE: Multiplanar, multiecho pulse sequences of the brain and surrounding structures were obtained without intravenous contrast. COMPARISON:  None. FINDINGS: Brain: There is no acute infarction or intracranial hemorrhage. There are chronic infarcts of the parasagittal right frontoparietal lobes (ACA territory) and left parietal lobe (MCA territory). There are some chronic blood products associated with the infarct on the right. Chronic small vessel infarcts of the right caudate and left corona radiata. Additional patchy and confluent areas of T2 hyperintensity in the supratentorial white matter nonspecific but may reflect moderate chronic microvascular ischemic changes. Prominence of the ventricles and sulci reflects generalized parenchymal volume loss. Small focus of susceptibility in the left frontal lobe is compatible with chronic microhemorrhage or less likely mineralization. There is no  intracranial mass, mass effect, or edema. There is no hydrocephalus or extra-axial fluid collection. Vascular: Loss of the expected flow void of the intracranial internal carotid arteries to the level of the clinoid processes. Skull and upper cervical spine: Normal marrow signal is preserved. Sinuses/Orbits: Paranasal sinuses are aerated. Orbits are unremarkable. Other: Sella is unremarkable. Opacification of the right mastoid. There is some corresponding reduced diffusion. Ossicular erosion is evident on prior head CT. IMPRESSION: No acute infarction, hemorrhage, or mass. Loss of the normal flow voids of the intracranial internal carotid arteries, which may reflect slow flow or chronic occlusion. There appears to be reconstitution at the level of the clinoid processes. Multiple chronic infarcts.  Chronic microvascular ischemic changes. Abnormal appearance of the right mastoid and middle ear. Correlating with prior CT, this is most consistent with a cholesteatoma. Electronically Signed   By: Guadlupe Spanish M.D.   On: 05/06/2020 19:31   CT SHOULDER LEFT WO CONTRAST  Result Date: 05/07/2020 CLINICAL DATA:  Fracture seen radiograph EXAM: CT OF THE UPPER LEFT EXTREMITY WITHOUT CONTRAST TECHNIQUE: Multidetector CT imaging of the upper left extremity was performed according to the standard protocol. COMPARISON:  Radiograph same day FINDINGS: Bones/Joint/Cartilage There is a comminuted mildly impacted fracture seen through the surgical neck of the proximal humerus and greater tuberosity. The humeral head still articulates with the glenoid. There is a small glenohumeral joint effusion present. Ligaments Suboptimally assessed by CT. Muscles and Tendons There is mild edema surrounding the lateral aspect of the humerus. The visualized portion of the tendons appear to be intact. Soft tissues Mild overlying soft tissue swelling is seen. There is a small left pleural effusion present. IMPRESSION: Comminuted mildly impacted  fracture involving the proximal humerus surgical neck and greater tuberosity. Electronically Signed   By: Jonna Clark M.D.   On: 05/07/2020 20:31   DG Humerus Left  Result Date: 05/07/2020 CLINICAL DATA:  Pt has AMS and is complaining of pain in left arm. He has significant amounts of bruising at the elbow around his IV. EXAM: LEFT HUMERUS - 2+ VIEW COMPARISON:  None. FINDINGS: There is an impacted and mildly comminuted fracture of the proximal left humerus. Regional soft tissues are unremarkable. IMPRESSION: Impacted and mildly comminuted fracture of the proximal left humerus. Electronically Signed   By: Emmaline Kluver M.D.   On: 05/07/2020 16:17   ECHOCARDIOGRAM COMPLETE  Result Date: 05/07/2020    ECHOCARDIOGRAM REPORT   Patient Name:   ANH  Calixto Date of Exam: 05/07/2020 Medical Rec #:  [id number]   Height:       65.0 in Accession #:    [id number]  Weight:       121.3 lb Date of Birth:  04/08/1946   BSA:          1.599 m Patient Age:    74 years    BP:           135/64 mmHg Patient Gender: M           HR:           74 bpm. Exam Location:  Inpatient Procedure: 2D Echo Indications:    785.2 murmur  History:        Patient has no prior history of Echocardiogram examinations.                 Stroke; Risk Factors:Hypertension and Current Smoker.  Sonographer:    Celene Skeen RDCS (AE) Referring Phys: [id number] [place] General  Sonographer Comments: No subcostal window. restricted mobility - left shoulder injured IMPRESSIONS  1. Left ventricular ejection fraction, by estimation, is 60 to 65%. The left ventricle has normal function. The left ventricle has no regional wall motion abnormalities. Left ventricular diastolic parameters are indeterminate.  2. Right ventricular systolic function is normal. The right ventricular size is normal.  3. The mitral valve is normal in structure. Trivial mitral valve regurgitation.  4. The aortic valve is tricuspid. Aortic valve regurgitation is not visualized. Mild aortic  valve sclerosis is present, with no evidence of aortic valve stenosis. FINDINGS  Left Ventricle: Left ventricular ejection fraction, by estimation, is 60 to 65%. The left ventricle has normal function. The left ventricle has no regional wall motion abnormalities. The left ventricular internal cavity size was normal in size. There is  no left ventricular hypertrophy. Left ventricular diastolic parameters are indeterminate. Right Ventricle: The right ventricular size is normal. Right vetricular wall thickness was not assessed. Right ventricular systolic function is normal. Left Atrium: Left atrial size was normal in size. Right Atrium: Right atrial size was normal in size. Pericardium: Trivial pericardial effusion is present. Mitral Valve: The mitral valve is normal in structure. Trivial mitral valve regurgitation. Tricuspid Valve: The tricuspid valve is normal in structure. Tricuspid valve regurgitation is mild. Aortic Valve: The aortic valve is tricuspid. Aortic valve regurgitation is not visualized. Mild aortic valve sclerosis is present, with no evidence of aortic valve stenosis. Pulmonic Valve: The pulmonic valve was grossly normal. Pulmonic valve regurgitation is not visualized. Aorta: The aortic root is normal in size and structure. Venous: The inferior vena cava was not well visualized. IAS/Shunts: No atrial level shunt detected by color flow Doppler.  LEFT VENTRICLE PLAX 2D LVIDd:         3.40 cm  Diastology LVIDs:         2.45 cm  LV e' lateral:   9.57 cm/s LV PW:         0.90 cm  LV E/e' lateral: 5.6 LV IVS:        1.00 cm  LV e' medial:    6.20 cm/s LVOT diam:     1.90 cm  LV E/e' medial:  8.6 LV SV:         57 LV SV Index:   35 LVOT Area:     2.84 cm  RIGHT VENTRICLE TAPSE (M-mode): 1.5 cm LEFT ATRIUM             Index  RIGHT ATRIUM           Index LA diam:        2.80 cm 1.75 cm/m  RA Area:     12.70 cm LA Vol (A2C):   26.8 ml 16.76 ml/m RA Volume:   22.30 ml  13.94 ml/m LA Vol (A4C):   23.6 ml  14.76 ml/m LA Biplane Vol: 25.7 ml 16.07 ml/m  AORTIC VALVE LVOT Vmax:   88.10 cm/s LVOT Vmean:  60.100 cm/s LVOT VTI:    0.200 m  AORTA Ao Root diam: 3.00 cm MITRAL VALVE MV Area (PHT): 2.32 cm    SHUNTS MV Decel Time: 327 msec    Systemic VTI:  0.20 m MV E velocity: 53.60 cm/s  Systemic Diam: 1.90 cm MV A velocity: 68.60 cm/s MV E/A ratio:  0.78 Dietrich Pates MD Electronically signed by Dietrich Pates MD Signature Date/Time: 05/07/2020/11:14:28 AM    Final         Scheduled Meds: . amLODipine  5 mg Oral q morning - 10a  . aspirin EC  325 mg Oral Daily  . carvedilol  12.5 mg Oral BID WC  . Chlorhexidine Gluconate Cloth  6 each Topical Daily  . enoxaparin (LOVENOX) injection  40 mg Subcutaneous Q24H  . feeding supplement (ENSURE ENLIVE)  237 mL Oral TID BM  . multivitamin with minerals  1 tablet Oral Daily  . phosphorus  500 mg Oral TID  . potassium chloride  40 mEq Oral Q6H  . risperiDONE  1 mg Oral Daily  . vitamin E  400 Units Oral q morning - 10a   Continuous Infusions: . sodium chloride 100 mL/hr at 05/08/20 0859  . ceFEPime (MAXIPIME) IV 2 g (05/08/20 1236)     LOS: 2 days    Time spent: 35 minutes.     Alba Cory, MD [facility]   If 7PM-7AM, please contact night-coverage [email or web address]  05/08/2020, 1:38 PM

## 2020-05-09 DIAGNOSIS — G9341 Metabolic encephalopathy: Secondary | ICD-10-CM

## 2020-05-09 DIAGNOSIS — S42309A Unspecified fracture of shaft of humerus, unspecified arm, initial encounter for closed fracture: Secondary | ICD-10-CM

## 2020-05-09 LAB — BASIC METABOLIC PANEL
Anion gap: 9 (ref 5–15)
BUN: 11 mg/dL (ref 8–23)
CO2: 24 mmol/L (ref 22–32)
Calcium: 7.8 mg/dL — ABNORMAL LOW (ref 8.9–10.3)
Chloride: 100 mmol/L (ref 98–111)
Creatinine, Ser: 0.43 mg/dL — ABNORMAL LOW (ref 0.61–1.24)
GFR calc Af Amer: >60 mL/min (ref >60)
GFR calc non Af Amer: >60 mL/min (ref >60)
Glucose, Bld: 129 mg/dL — ABNORMAL HIGH (ref 70–99)
Potassium: 3.8 mmol/L (ref 3.5–5.1)
Sodium: 133 mmol/L — ABNORMAL LOW (ref 135–145)

## 2020-05-09 LAB — CBC
HCT: 35.2 % — ABNORMAL LOW (ref 39.0–52.0)
Hemoglobin: 11.9 g/dL — ABNORMAL LOW (ref 13.0–17.0)
MCH: 29.6 pg (ref 26.0–34.0)
MCHC: 33.8 g/dL (ref 30.0–36.0)
MCV: 87.6 fL (ref 80.0–100.0)
Platelets: 229 10*3/uL (ref 150–400)
RBC: 4.02 MIL/uL — ABNORMAL LOW (ref 4.22–5.81)
RDW: 13.5 % (ref 11.5–15.5)
WBC: 10.6 10*3/uL — ABNORMAL HIGH (ref 4.0–10.5)
nRBC: 0 % (ref 0.0–0.2)

## 2020-05-09 LAB — MAGNESIUM: Magnesium: 1.8 mg/dL (ref 1.7–2.4)

## 2020-05-09 LAB — T3, FREE: T3, Free: 1.4 pg/mL — ABNORMAL LOW (ref 2.0–4.4)

## 2020-05-09 LAB — PHOSPHORUS: Phosphorus: 2 mg/dL — ABNORMAL LOW (ref 2.5–4.6)

## 2020-05-09 MED ORDER — ADULT MULTIVITAMIN W/MINERALS CH: 1.0000 | ORAL_TABLET | Freq: Every day | ORAL | 0 refills | Status: AC

## 2020-05-09 MED ORDER — CARVEDILOL 12.5 MG PO TABS: 12.5000 mg | ORAL_TABLET | Freq: Two times a day (BID) | ORAL | 0 refills | Status: DC

## 2020-05-09 MED ORDER — CIPROFLOXACIN HCL 500 MG PO TABS: 500.0000 mg | ORAL_TABLET | Freq: Two times a day (BID) | ORAL | 0 refills | Status: DC

## 2020-05-09 MED ORDER — K PHOS MONO-SOD PHOS DI & MONO 155-852-130 MG PO TABS: 500.0000 mg | ORAL_TABLET | Freq: Three times a day (TID) | ORAL | 0 refills | Status: DC

## 2020-05-09 MED ORDER — K PHOS MONO-SOD PHOS DI & MONO 155-852-130 MG PO TABS
500.0000 mg | ORAL_TABLET | Freq: Three times a day (TID) | ORAL | Status: AC
  Administered 2020-05-09 – 2020-05-10 (×3): 500 mg via ORAL
  Filled 2020-05-09 (×3): qty 2

## 2020-05-09 NOTE — Progress Notes (Signed)
Physical Therapy Treatment Patient Details Name: Alan [name] MRN: [id number] DOB: 04-May-1946 Today's Date: 05/09/2020    History of Present Illness Alan [name] is a 74 y.o. male with medical history significant of HTN,  PTSD, schizophrenia, presented with altered mental status and fall.  Patient [place], niece lives close, and neighbor who visit him 1-2 times a week.  Niece visited him this morning found him on the floor, lying down, awake and confused. Unsure how long he was down; found to have Rhabdomyolysis; Beuising L UE, and CT revealed an impacted fracture of the proximal humerus without significant displacement.    PT Comments    Continuing work on functional mobility and activity tolerance;  Notable that imaging reveals L prox humerus fx, and he is now NWB LUE; Session focused on functional transfers; today, Alan [name] needed +2 Max assist for sit to stand and pivot trnafers; tends to try to use LUE, and we had to reposition his sling several times during session; continue to rec post-acute rehab at SNF to maximize independence and safety with mobility; pt's nephew present during session   Follow Up Recommendations  SNF     Equipment Recommendations  Other (comment) (tbd; NWB LUE, so cannot use RW)    Recommendations for Other Services       Precautions / Restrictions Precautions Precautions: Fall;Other (comment) Precaution Comments: L UE sling Restrictions LUE Weight Bearing: Non weight bearing    Mobility  Bed Mobility Overal bed mobility: Needs Assistance Bed Mobility: Supine to Sit     Supine to sit: Max assist;HOB elevated     General bed mobility comments: Max A with frequent cues to avoid L UE use, guided pt to reach to bed rail with R UE. Difficulty scooting EOB and maintaining sitting balance  Transfers Overall transfer level: Needs assistance Equipment used: 2 person hand held assist Transfers: Sit to/from [employer] Sit to  Stand: Max assist   Squat pivot transfers: Max assist     General transfer comment: 2 person assist to stand and pivot to recliner; Max assist to rise and steady, and required knee blocking for stability; stood x2 from recliner for hygeine, as he was incontinent of bowels  Ambulation/Gait                 Stairs             Wheelchair Mobility    Modified Rankin (Stroke Patients Only)       Balance     Sitting balance-Leahy Scale: Poor       Standing balance-Leahy Scale: Zero Standing balance comment: Max A to maintain standing balance                            Cognition Arousal/Alertness: Awake/alert Behavior During Therapy: WFL for tasks assessed/performed Overall Cognitive Status: History of cognitive impairments - at baseline                                        Exercises      General Comments        Pertinent Vitals/Pain Pain Assessment: Faces Faces Pain Scale: Hurts little more Pain Location: L shoulder and L distal knee, anterior lower leg Pain Descriptors / Indicators: Aching Pain Intervention(s): Monitored during session    Home Living  Prior Function            PT Goals (current goals can now be found in the care plan section) Acute Rehab PT Goals Patient Stated Goal: pt motivated to get to recliner chair PT Goal Formulation: With patient Time For Goal Achievement: 05/21/20 Potential to Achieve Goals: Fair Progress towards PT goals: Progressing toward goals (slowly)    Frequency    Min 2X/week      PT Plan Current plan remains appropriate;Equipment recommendations need to be updated    Co-evaluation              AM-PAC PT "6 Clicks" Mobility   Outcome Measure  Help needed turning from your back to your side while in a flat bed without using bedrails?: A Little Help needed moving from lying on your back to sitting on the side of a flat bed without  using bedrails?: A Lot Help needed moving to and from a bed to a chair (including a wheelchair)?: A Lot Help needed standing up from a chair using your arms (e.g., wheelchair or bedside chair)?: A Lot Help needed to walk in hospital room?: A Lot Help needed climbing 3-5 steps with a railing? : Total 6 Click Score: 12    End of Session Equipment Utilized During Treatment: Gait belt Activity Tolerance: Patient tolerated treatment well Patient left: in chair;with call bell/phone within reach;with chair alarm set Nurse Communication: Mobility status PT Visit Diagnosis: Unsteadiness on feet (R26.81);Other abnormalities of gait and mobility (R26.89);History of falling (Z91.81);Muscle weakness (generalized) (M62.81)     Time: 5638-9373 PT Time Calculation (min) (ACUTE ONLY): 32 min  Charges:  $Therapeutic Activity: 23-37 mins                     Van Clines, PT  Acute Rehabilitation Services Pager [phone] Office [phone]    [name] 05/09/2020, 4:04 PM

## 2020-05-09 NOTE — Discharge Summary (Addendum)
Physician Discharge Summary  Alann Avey ZHG:[id number] DOB: [birth year] DOA: 05/06/2020  PCP: Barbie Banner, MD  Admit date: 05/06/2020 Discharge date: 05/09/2020  Admitted From: Home  Disposition:  SNF  Recommendations for Outpatient Follow-up:  1. Follow up with PCP in 1-2 weeks 2. Please obtain BMP/CBC in one week 3. Follow up with Dr Victorino Dike in 2 weeks for humerus fracture care, needs repeat x ray.  4. Repeat phosphorus level.  5. Follow up with Vascular for ICA stenosis.  6. Follow up with Dr Jenne Pane for further care of Cholesteatoma.  7. Needs thyroid function test in 4 weeks.    Discharge Condition: Stable.  CODE STATUS: Full code Diet recommendation: Regular diet.   Brief/Interim Summary: 74 year old with past medical history significant for hypertension, PTSD, schizophrenia presented with altered mental status and fall.  Patient was very confused in the ED most of the history was provided by patient niece who was at bedside.  Patient [place], niece visits him 1 or twice a week.  Patient was found on the floor by his niece.  He was awake and confused. Patient  Report that he fell down on Monday.  At baseline patient is able to ambulate with the assistance of a walker.  Patient appears more confused than his baseline.  Patient reported generalized weakness before he fell down. Evaluation in the ED UA consistent with infection, positive nitrates, white blood cells 6-10.  White blood cell 18. CT showed cholesteatoma.    1-Rhabdomyolysis;  Nontraumatic, due to prolonged immobilization. Treated with IV fluids. Follow CK level trend. 2126----1150   2-Acute metabolic encephalopathy Patient was alert oriented to place and name. Continue to correct electrolyte abnormalities. Continue with treatment of urinary tract infection Improving.   3-UTI, Pseudomonas, urinary retention: Patient presents with leukocytosis.  UA with positive nitrates, white blood cell  6-10. Urine culture growing Pseudomonas. Continue with cefepime day 3. discharge on cipro for 2 days.  Blood cultures -No growth today.  Hypomagnesemia: Replaced.  Hypokalemia, replete with oral  Hypophosphatemia; replete orally.   Heart murmur; no significant valvular disease 2D ECHO.   Left Proximal Humerus fracture, comminuted, mildly impacted.  Evaluated by Dr Victorino Dike, who recommended non operative management. Sling for immobilization..  Needs to follow up with Dr Victorino Dike in 2 weeks.   Left arm weakness, left LE weakness;  -Patient with lot of bruise the left arm and unable to move it.  X ray ordered showed proximal humerus fracture.  -Discuss care with neurologist, in regards lower extremity weakness will need to first correct electrolyte derangement, if weakness persist will need formal neurology evaluation. Weakness improved.  -MRI: Negative for acute stroke. Loss of the normal flow voids of the intracranial internal carotid arteries, which may reflect slow flow or chronic occlusion. 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 These findings are likely chronic.There is segmental mild fusiform dilatation of the right posterior communicating artery possibly reflecting post-stenotic dilatation or flow related aneurysmal dilatation. -Discussed CTA finding with neurology, recommend out patient vascular follow up outpatient.   Hyponatremia; continue with IV fluids.  Likely related to hypovolemia. Improved.   Elevated TSH;  Free T 4; 1.18  Needs  repeat Thyroid function in 4 weeks.   Severe  protein caloric malnutrition; started   Ensure.  Cholesteatoma of right ear: Case discussed with on-call ENT Dr. Jenne Pane, recommend outpatient follow-up  Hypertension; continue with Norvasc, Coreg  Schizophrenia; continue with Risperdal  Stage II sacrum pressure injury, present on admission . Continue with local care.   Pressure Ulcer 02/16/12 Stage I -  Intact skin with non-blanchable redness of a localized area usually over a bony prominence. Red (Active)  02/16/12 2050  Location: Sacrum  Location Orientation:   Staging: Stage I -  Intact skin with non-blanchable redness of a localized area usually over a bony prominence.  Wound Description (Comments): Red  Present on Admission: Yes     Pressure Injury 05/06/20 Sacrum Mid Stage 2 -  Partial thickness loss of dermis presenting as a shallow open injury with a red, pink wound bed without slough. Partial loss of dermis,shallow red wound bed with scant serous drainage. (Active)  05/06/20 1600  Location: Sacrum  Location Orientation: Mid  Staging: Stage 2 -  Partial thickness loss of dermis presenting as a shallow open injury with a red, pink wound bed without slough.  Wound Description (Comments): Partial loss of dermis,shallow red wound bed with scant serous drainage.  Present on Admission: Yes     Discharge Diagnoses:  Active Problems:   Urinary tract infection without hematuria   Rhabdomyolysis   Pressure injury of skin    Discharge Instructions  Discharge Instructions    Non weight bearing   Complete by: As directed    Laterality: left   Extremity: Upper     Allergies as of 05/09/2020   No Known Allergies     Medication List    STOP taking these medications   benztropine 0.5 MG tablet Commonly known as: COGENTIN   hydrochlorothiazide 12.5 MG capsule Commonly known as: MICROZIDE     TAKE these medications   amLODipine 5 MG tablet Commonly known as: NORVASC Take 5 mg by mouth every morning.   aspirin EC 325 MG tablet Take 325 mg by mouth  daily.   carvedilol 12.5 MG tablet Commonly known as: COREG Take 1 tablet (12.5 mg total) by mouth 2 (two) times daily with a meal. What changed:   medication strength  how much to take   ciprofloxacin 500 MG tablet Commonly known as: Cipro Take 1 tablet (500 mg total) by mouth 2 (two) times daily for 2 days.   multivitamin with minerals Tabs tablet Take 1 tablet by mouth daily. Start taking on: May 10, 2020   phosphorus [id number] MG tablet Commonly known as: K PHOS NEUTRAL Take 2 tablets (500 mg total) by mouth 3 (three) times daily.   risperiDONE 1 MG tablet Commonly known as: RISPERDAL Take 1 mg by mouth daily.   tiotropium 18 MCG inhalation capsule Commonly known as: SPIRIVA Place 1 capsule (18 mcg total) into inhaler and inhale daily.   vitamin E 180 MG (400 UNITS) capsule Take 400 Units by mouth every morning.            Discharge Care Instructions  (From admission, onward)         Start     Ordered   05/08/20 0000  Non weight bearing       Question Answer Comment  Laterality left   Extremity Upper      05/08/20 1057          Follow-up Information    Toni Arthurs, MD Follow up in  2 week(s).   Specialty: Orthopedic Surgery Contact information: [address] [place] [address] [phone]              No Known Allergies  Consultations:  Dr Victorino Dike  Neurology, phone consultation.    Procedures/Studies: CT ANGIO HEAD W OR WO CONTRAST  Result Date: 05/07/2020 CLINICAL DATA:  Abnormal MRI EXAM: CT ANGIOGRAPHY HEAD AND NECK TECHNIQUE: Multidetector CT imaging of the head and neck was performed using the standard protocol during bolus administration of intravenous contrast. Multiplanar CT image reconstructions and MIPs were obtained to evaluate the vascular anatomy. Carotid stenosis measurements (when applicable) are obtained utilizing NASCET criteria, using the distal internal carotid diameter as the denominator.  CONTRAST:  75mL OMNIPAQUE IOHEXOL 350 MG/ML SOLN COMPARISON:  Correlation made with recent prior imaging FINDINGS: CTA NECK Aortic arch: Calcified and irregular noncalcified plaque present along the arch and at the great vessel origins. Moderate stenosis of the proximal left subclavian artery. Right carotid system: Common carotid is patent with mild calcified plaque. There is calcified and noncalcified plaque at the carotid bifurcation extending along the proximal internal carotid. 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 Left carotid system: Marked stenosis at the origin with partial reconstitution and subsequent additional plaque with occlusion. There is reconstitution of the external carotid. Cervical internal carotid is occluded throughout the neck. Vertebral arteries: Patent. Calcified plaque at the left vertebral origin causes less than 50% stenosis. There is additional mild calcified plaque bilaterally. Both arteries appear prominent likely due to carotid occlusions. Skeleton: Mild degenerative changes the cervical spine. Other neck: Subcentimeter right thyroid nodule for which no further follow-up is recommended by current guidelines. No mass or adenopathy. Upper chest: No apical lung mass. Review of the MIP images confirms the above findings CTA HEAD Anterior circulation: Reconstitution of the intracranial internal carotid arteries near the anterior clinoid processes. Anterior cerebral arteries are patent. Left A1 ACA is dominant. Anterior communicating artery is present. Middle cerebral arteries are patent. Posterior circulation: Intracranial vertebral arteries are patent with mild calcified plaque. Basilar artery is patent. Posterior cerebral arteries are patent. Bilateral posterior communicating arteries are present and likely contribute to reconstitution of internal carotids. There is segmental mild fusiform dilatation of the right posterior communicating artery  (series 14, image 20). Venous sinuses: Patent as allowed by contrast bolus timing. 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 Occluded left CCA and left cervical ICA with some reconstitution of the supraclinoid portion intracranially. These findings are likely chronic. Patent posterior circulation with large vertebral arteries and patent posterior communicating arteries. There is segmental mild fusiform dilatation of the right posterior communicating artery possibly reflecting post-stenotic dilatation or flow related aneurysmal dilatation. Electronically Signed   By: Guadlupe Spanish M.D.   On: 05/07/2020 13:46   DG Forearm Left  Result Date: 05/07/2020 CLINICAL DATA:  Pt has AMS and is complaining of pain in left arm. He has significant amounts of bruising at the elbow around his IV. EXAM: LEFT FOREARM - 2 VIEW COMPARISON:  None. FINDINGS: There is no evidence of fracture or other focal bone lesions. Soft tissues are unremarkable. IMPRESSION: No acute osseous abnormality in the left forearm. Electronically Signed   By: Emmaline Kluver M.D.   On: 05/07/2020 16:18   CT HEAD WO CONTRAST  Result Date: 05/06/2020 CLINICAL DATA:  Ataxia.  Head  trauma. EXAM: CT HEAD WITHOUT CONTRAST TECHNIQUE: Contiguous axial images were obtained from the base of the skull through the vertex without intravenous contrast. COMPARISON:  None. FINDINGS: Brain: Mild cerebral atrophy. Encephalomalacia involving the medial right frontal lobe. Evidence for encephalomalacia in the posterior left parietal lobe. Scattered areas of periventricular low density. Negative for acute hemorrhage, mass lesion, midline shift, hydrocephalus or large new infarct. Vascular: No hyperdense vessel or unexpected calcification. Skull: Normal. Negative for fracture or focal lesion. Sinuses/Orbits: Right mastoid sinus  is opacified and there is a soft tissue lesion in the mastoid antrum obstructing the mastoid sinus and extending to the right middle ear ossicles. Findings are suggestive for a cholesteatoma. Visualized paranasal sinuses are unremarkable. Other: None. IMPRESSION: 1. Soft tissue lesion in the right mastoid antrum that is obstructing the right mastoid sinus and extending to the right middle ear ossicles. 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 2. Scattered areas of encephalomalacia and white matter disease in the brain. Findings are likely related to previous insults or infarcts. No acute intracranial abnormality. These results were called by telephone at the time of interpretation on 05/06/2020 at 12:08 pm to provider ABIGAIL HARRIS , who verbally acknowledged these results. Electronically Signed   By: Richarda Overlie M.D.   On: 05/06/2020 12:09   CT ANGIO NECK W OR WO CONTRAST  Result Date: 05/07/2020 CLINICAL DATA:  Abnormal MRI EXAM: CT ANGIOGRAPHY HEAD AND NECK TECHNIQUE: Multidetector CT imaging of the head and neck was performed using the standard protocol during bolus administration of intravenous contrast. Multiplanar CT image reconstructions and MIPs were obtained to evaluate the vascular anatomy. Carotid stenosis measurements (when applicable) are obtained utilizing NASCET criteria, using the distal internal carotid diameter as the denominator. CONTRAST:  75mL OMNIPAQUE IOHEXOL 350 MG/ML SOLN COMPARISON:  Correlation made with recent prior imaging FINDINGS: CTA NECK Aortic arch: Calcified and irregular noncalcified plaque present along the arch and at the great vessel origins. Moderate stenosis of the proximal left subclavian artery. Right carotid system: Common carotid is patent with mild calcified plaque. There is calcified and noncalcified plaque at the carotid bifurcation extending along the proximal internal carotid. Cervical ICA is occluded is beyond the origin without reconstitution in the  neck. Left carotid system: Marked stenosis at the origin with partial reconstitution and subsequent additional plaque with occlusion. There is reconstitution of the external carotid. Cervical internal carotid is occluded throughout the neck. Vertebral arteries: Patent. Calcified plaque at the left vertebral origin causes less than 50% stenosis. There is additional mild calcified plaque bilaterally. Both arteries appear prominent likely due to carotid occlusions. Skeleton: Mild degenerative changes the cervical spine. Other neck: Subcentimeter right thyroid nodule for which no further follow-up is recommended by current guidelines. No mass or adenopathy. Upper chest: No apical lung mass. Review of the MIP images confirms the above findings CTA HEAD Anterior circulation: Reconstitution of the intracranial internal carotid arteries near the anterior clinoid processes. Anterior cerebral arteries are patent. Left A1 ACA is dominant. Anterior communicating artery is present. Middle cerebral arteries are patent. Posterior circulation: Intracranial vertebral arteries are patent with mild calcified plaque. Basilar artery is patent. Posterior cerebral arteries are patent. Bilateral posterior communicating arteries are present and likely contribute to reconstitution of internal carotids. There is segmental mild fusiform dilatation of the right posterior communicating artery (series 14, image 20). Venous sinuses: Patent as allowed by contrast bolus timing. Review of the MIP images confirms the above findings IMPRESSION: Occluded right cervical ICA just beyond  the origin with some reconstitution of the supraclinoid portion intracranially. Occluded left CCA and left cervical ICA with some reconstitution of the supraclinoid portion intracranially. These findings are likely chronic. Patent posterior circulation with large vertebral arteries and patent posterior communicating arteries. There is segmental mild fusiform dilatation of  the right posterior communicating artery possibly reflecting post-stenotic dilatation or flow related aneurysmal dilatation. Electronically Signed   By: Guadlupe Spanish M.D.   On: 05/07/2020 13:46   CT Cervical Spine Wo Contrast  Result Date: 05/06/2020 CLINICAL DATA:  Fall. EXAM: CT CERVICAL SPINE WITHOUT CONTRAST TECHNIQUE: Multidetector CT imaging of the cervical spine was performed without intravenous contrast. Multiplanar CT image reconstructions were also generated. COMPARISON:  Head CT 05/06/2020 FINDINGS: Alignment: Normal. Skull base and vertebrae: No acute fracture. No primary bone lesion or focal pathologic process. Mild degenerative facet disease. Soft tissues and spinal canal: No prevertebral fluid or swelling. No visible canal hematoma. Disc levels: No significant disc space narrowing. Mild posterior disc space narrowing at C3-C4. Upper chest: Negative Other: Carotid artery calcifications. 9 mm low-density nodule in the right thyroid lobe. Visualized right mastoid sinus is opacified. IMPRESSION: No acute abnormality to the cervical spine. Electronically Signed   By: Richarda Overlie M.D.   On: 05/06/2020 12:18   MR BRAIN WO CONTRAST  Result Date: 05/06/2020 CLINICAL DATA:  Altered mental status and fall EXAM: MRI HEAD WITHOUT CONTRAST TECHNIQUE: Multiplanar, multiecho pulse sequences of the brain and surrounding structures were obtained without intravenous contrast. COMPARISON:  None. FINDINGS: Brain: There is no acute infarction or intracranial hemorrhage. There are chronic infarcts of the parasagittal right frontoparietal lobes (ACA territory) and left parietal lobe (MCA territory). There are some chronic blood products associated with the infarct on the right. Chronic small vessel infarcts of the right caudate and left corona radiata. Additional patchy and confluent areas of T2 hyperintensity in the supratentorial white matter nonspecific but may reflect moderate chronic microvascular ischemic  changes. Prominence of the ventricles and sulci reflects generalized parenchymal volume loss. Small focus of susceptibility in the left frontal lobe is compatible with chronic microhemorrhage or less likely mineralization. There is no intracranial mass, mass effect, or edema. There is no hydrocephalus or extra-axial fluid collection. Vascular: Loss of the expected flow void of the intracranial internal carotid arteries to the level of the clinoid processes. Skull and upper cervical spine: Normal marrow signal is preserved. Sinuses/Orbits: Paranasal sinuses are aerated. Orbits are unremarkable. Other: Sella is unremarkable. Opacification of the right mastoid. There is some corresponding reduced diffusion. Ossicular erosion is evident on prior head CT. IMPRESSION: No acute infarction, hemorrhage, or mass. Loss of the normal flow voids of the intracranial internal carotid arteries, which may reflect slow flow or chronic occlusion. There appears to be reconstitution at the level of the clinoid processes. Multiple chronic infarcts.  Chronic microvascular ischemic changes. Abnormal appearance of the right mastoid and middle ear. Correlating with prior CT, this is most consistent with a cholesteatoma. Electronically Signed   By: Guadlupe Spanish M.D.   On: 05/06/2020 19:31   CT SHOULDER LEFT WO CONTRAST  Result Date: 05/07/2020 CLINICAL DATA:  Fracture seen radiograph EXAM: CT OF THE UPPER LEFT EXTREMITY WITHOUT CONTRAST TECHNIQUE: Multidetector CT imaging of the upper left extremity was performed according to the standard protocol. COMPARISON:  Radiograph same day FINDINGS: Bones/Joint/Cartilage There is a comminuted mildly impacted fracture seen through the surgical neck of the proximal humerus and greater tuberosity. The humeral head still articulates with the glenoid.  There is a small glenohumeral joint effusion present. Ligaments Suboptimally assessed by CT. Muscles and Tendons There is mild edema surrounding the  lateral aspect of the humerus. The visualized portion of the tendons appear to be intact. Soft tissues Mild overlying soft tissue swelling is seen. There is a small left pleural effusion present. IMPRESSION: Comminuted mildly impacted fracture involving the proximal humerus surgical neck and greater tuberosity. Electronically Signed   By: Jonna Clark M.D.   On: 05/07/2020 20:31   DG Chest Port 1 View  Result Date: 05/06/2020 CLINICAL DATA:  Status post fall today.  Initial encounter. EXAM: PORTABLE CHEST 1 VIEW COMPARISON:  PA and lateral chest 10/12/2010. FINDINGS: The lungs are emphysematous but clear. Aortic atherosclerosis. Heart size normal. No pneumothorax or pleural effusion. No acute bony abnormality. IMPRESSION: No acute disease. Aortic Atherosclerosis (ICD10-I70.0) and Emphysema (ICD10-J43.9). Electronically Signed   By: Drusilla Kanner M.D.   On: 05/06/2020 11:11   DG Humerus Left  Result Date: 05/07/2020 CLINICAL DATA:  Pt has AMS and is complaining of pain in left arm. He has significant amounts of bruising at the elbow around his IV. EXAM: LEFT HUMERUS - 2+ VIEW COMPARISON:  None. FINDINGS: There is an impacted and mildly comminuted fracture of the proximal left humerus. Regional soft tissues are unremarkable. IMPRESSION: Impacted and mildly comminuted fracture of the proximal left humerus. Electronically Signed   By: Emmaline Kluver M.D.   On: 05/07/2020 16:17   ECHOCARDIOGRAM COMPLETE  Result Date: 05/07/2020    ECHOCARDIOGRAM REPORT   Patient Name:   GRANVIL DJORDJEVIC Date of Exam: 05/07/2020 Medical Rec #:  [id number]   Height:       65.0 in Accession #:    [id number]  Weight:       121.3 lb Date of Birth:  11-21-1945   BSA:          1.599 m Patient Age:    74 years    BP:           135/64 mmHg Patient Gender: M           HR:           74 bpm. Exam Location:  Inpatient Procedure: 2D Echo Indications:    785.2 murmur  History:        Patient has no prior history of Echocardiogram  examinations.                 Stroke; Risk Factors:Hypertension and Current Smoker.  Sonographer:    Celene Skeen RDCS (AE) Referring Phys: [id number] [place] General  Sonographer Comments: No subcostal window. restricted mobility - left shoulder injured IMPRESSIONS  1. Left ventricular ejection fraction, by estimation, is 60 to 65%. The left ventricle has normal function. The left ventricle has no regional wall motion abnormalities. Left ventricular diastolic parameters are indeterminate.  2. Right ventricular systolic function is normal. The right ventricular size is normal.  3. The mitral valve is normal in structure. Trivial mitral valve regurgitation.  4. The aortic valve is tricuspid. Aortic valve regurgitation is not visualized. Mild aortic valve sclerosis is present, with no evidence of aortic valve stenosis. FINDINGS  Left Ventricle: Left ventricular ejection fraction, by estimation, is 60 to 65%. The left ventricle has normal function. The left ventricle has no regional wall motion abnormalities. The left ventricular internal cavity size was normal in size. There is  no left ventricular hypertrophy. Left ventricular diastolic parameters are indeterminate. Right Ventricle: The right ventricular size is  normal. Right vetricular wall thickness was not assessed. Right ventricular systolic function is normal. Left Atrium: Left atrial size was normal in size. Right Atrium: Right atrial size was normal in size. Pericardium: Trivial pericardial effusion is present. Mitral Valve: The mitral valve is normal in structure. Trivial mitral valve regurgitation. Tricuspid Valve: The tricuspid valve is normal in structure. Tricuspid valve regurgitation is mild. Aortic Valve: The aortic valve is tricuspid. Aortic valve regurgitation is not visualized. Mild aortic valve sclerosis is present, with no evidence of aortic valve stenosis. Pulmonic Valve: The pulmonic valve was grossly normal. Pulmonic valve regurgitation is not  visualized. Aorta: The aortic root is normal in size and structure. Venous: The inferior vena cava was not well visualized. IAS/Shunts: No atrial level shunt detected by color flow Doppler.  LEFT VENTRICLE PLAX 2D LVIDd:         3.40 cm  Diastology LVIDs:         2.45 cm  LV e' lateral:   9.57 cm/s LV PW:         0.90 cm  LV E/e' lateral: 5.6 LV IVS:        1.00 cm  LV e' medial:    6.20 cm/s LVOT diam:     1.90 cm  LV E/e' medial:  8.6 LV SV:         57 LV SV Index:   35 LVOT Area:     2.84 cm  RIGHT VENTRICLE TAPSE (M-mode): 1.5 cm LEFT ATRIUM             Index       RIGHT ATRIUM           Index LA diam:        2.80 cm 1.75 cm/m  RA Area:     12.70 cm LA Vol (A2C):   26.8 ml 16.76 ml/m RA Volume:   22.30 ml  13.94 ml/m LA Vol (A4C):   23.6 ml 14.76 ml/m LA Biplane Vol: 25.7 ml 16.07 ml/m  AORTIC VALVE LVOT Vmax:   88.10 cm/s LVOT Vmean:  60.100 cm/s LVOT VTI:    0.200 m  AORTA Ao Root diam: 3.00 cm MITRAL VALVE MV Area (PHT): 2.32 cm    SHUNTS MV Decel Time: 327 msec    Systemic VTI:  0.20 m MV E velocity: 53.60 cm/s  Systemic Diam: 1.90 cm MV A velocity: 68.60 cm/s MV E/A ratio:  0.78 Dietrich PatesPaula Ross MD Electronically signed by Dietrich PatesPaula Ross MD Signature Date/Time: 05/07/2020/11:14:28 AM    Final    DG HIP UNILAT WITH PELVIS 2-3 VIEWS LEFT  Result Date: 05/06/2020 CLINICAL DATA:  Left hip pain. EXAM: DG HIP (WITH OR WITHOUT PELVIS) 2-3V LEFT COMPARISON:  Pelvis 10/12/2010 FINDINGS: Pelvic bony ring is intact. Left hip is located without a fracture. No gross abnormality to the right hip. Large amount of stool in the pelvis and right lower abdomen. Atherosclerotic calcifications. IMPRESSION: No acute bone abnormality in the pelvis or left hip. Large amount of stool in the abdomen and pelvis. Electronically Signed   By: Richarda OverlieAdam  Henn M.D.   On: 05/06/2020 12:32   DG HIP UNILAT WITH PELVIS 2-3 VIEWS RIGHT  Result Date: 05/06/2020 CLINICAL DATA:  Fall. EXAM: DG HIP (WITH OR WITHOUT PELVIS) 2-3V RIGHT COMPARISON:   Left hip 05/06/2020 FINDINGS: Right hip is located without a fracture. Large amount of stool in the abdomen and pelvis. Visualized pelvic bony structures are intact. IMPRESSION: No acute bone abnormality to the right hip.  Electronically Signed   By: Richarda Overlie M.D.   On: 05/06/2020 12:34      Subjective: Alert, speech soft. He is less confuse. Report mild pain left humerus.  LE weakness improved  Discharge Exam: Vitals:   05/09/20 0601 05/09/20 0921  BP: (!) 172/83 (!) 152/66  Pulse: 81 78  Resp: 18 18  Temp: 99.7 F (37.6 C) 98.8 F (37.1 C)  SpO2: 91% 99%     General: Pt is alert, awake, not in acute distress, thin appearing.  Cardiovascular: RRR, S1/S2 +, no rubs, no gallops Respiratory: CTA bilaterally, no wheezing, no rhonchi Abdominal: Soft, NT, ND, bowel sounds + Extremities: no edema, no cyanosis, left arm on sling    The results of significant diagnostics from this hospitalization (including imaging, microbiology, ancillary and laboratory) are listed below for reference.     Microbiology: Recent Results (from the past 240 hour(s))  SARS Coronavirus 2 by RT PCR (hospital order, performed in [facility] hospital lab) Nasopharyngeal Nasopharyngeal Swab     Status: None   Collection Time: 05/06/20 10:24 AM   Specimen: Nasopharyngeal Swab  Result Value Ref Range Status   SARS Coronavirus 2 NEGATIVE NEGATIVE Final    Comment: (NOTE) SARS-CoV-2 target nucleic acids are NOT DETECTED.  The SARS-CoV-2 RNA is generally detectable in upper and lower respiratory specimens during the acute phase of infection. The lowest concentration of SARS-CoV-2 viral copies this assay can detect is 250 copies / mL. A negative result does not preclude SARS-CoV-2 infection and should not be used as the sole basis for treatment or other patient management decisions.  A negative result may occur with improper specimen collection / handling, submission of specimen other than nasopharyngeal  swab, presence of viral mutation(s) within the areas targeted by this assay, and inadequate number of viral copies (<250 copies / mL). A negative result must be combined with clinical observations, patient history, and epidemiological information.  Fact Sheet for Patients:   BoilerBrush.com.cy  Fact Sheet for Healthcare Providers: https://pope.com/  This test is not yet approved or  cleared by the [place] FDA and has been authorized for detection and/or diagnosis of SARS-CoV-2 by FDA under an Emergency Use Authorization (EUA).  This EUA will remain in effect (meaning this test can be used) for the duration of the COVID-19 declaration under Section 564(b)(1) of the Act, 21 U.S.C. section 360bbb-3(b)(1), unless the authorization is terminated or revoked sooner.  Performed at [facility], [address] [place], [place] [address]   Blood Cultures (routine x 2)     Status: None (Preliminary result)   Collection Time: 05/06/20 10:26 AM   Specimen: BLOOD  Result Value Ref Range Status   Specimen Description BLOOD SITE NOT SPECIFIED  Final   Special Requests   Final    BOTTLES DRAWN AEROBIC AND ANAEROBIC Blood Culture adequate volume   Culture   Final    NO GROWTH 3 DAYS Performed at [facility], [address] [place], [place] [address]    Report Status PENDING  Incomplete  Blood Cultures (routine x 2)     Status: None (Preliminary result)   Collection Time: 05/06/20 10:28 AM   Specimen: BLOOD  Result Value Ref Range Status   Specimen Description BLOOD SITE NOT SPECIFIED  Final   Special Requests   Final    BOTTLES DRAWN AEROBIC AND ANAEROBIC Blood Culture adequate volume   Culture   Final    NO GROWTH 3 DAYS Performed  at [facility], [address] [place], [place] [address]    Report Status PENDING  Incomplete  Urine culture     Status: Abnormal   Collection Time: 05/06/20 11:14 AM   Specimen:  Urine, Random  Result Value Ref Range Status   Specimen Description URINE, RANDOM  Final   Special Requests   Final    NONE Performed at [facility], [address], [place], [place] [address]    Culture >=100,000 COLONIES/mL PSEUDOMONAS AERUGINOSA (A)  Final   Report Status 05/08/2020 FINAL  Final   Organism ID, Bacteria PSEUDOMONAS AERUGINOSA (A)  Final      Susceptibility   Pseudomonas aeruginosa - MIC*    CEFTAZIDIME 4 SENSITIVE Sensitive     CIPROFLOXACIN <=0.25 SENSITIVE Sensitive     GENTAMICIN <=1 SENSITIVE Sensitive     IMIPENEM 8 INTERMEDIATE Intermediate     PIP/TAZO <=4 SENSITIVE Sensitive     CEFEPIME 2 SENSITIVE Sensitive     * >=100,000 COLONIES/mL PSEUDOMONAS AERUGINOSA     Labs: BNP (last 3 results) No results for input(s): BNP in the last 8760 hours. Basic Metabolic Panel: Recent Labs  Lab 05/06/20 1022 05/07/20 0254 05/08/20 0558 05/09/20 0743  NA 128* 132* 132* 133*  K 3.2* 2.7* 3.2* 3.8  CL 90* 98 99 100  CO2 23 21* 25 24  GLUCOSE 129* 95 94 129*  BUN CREATININE 0.63 0.61 0.53* 0.43*  CALCIUM 8.8* 7.8* 7.5* 7.8*  MG  --  1.6* 2.0 1.8  PHOS  --   --  1.4* 2.0*   Liver Function Tests: Recent Labs  Lab 05/06/20 1022  AST 59*  ALT 31  ALKPHOS 65  BILITOT 1.5*  PROT 6.7  ALBUMIN 3.7   No results for input(s): LIPASE, AMYLASE in the last 168 hours. Recent Labs  Lab 05/06/20 1021  AMMONIA 20   CBC: Recent Labs  Lab 05/06/20 1020 05/07/20 0254 05/08/20 0558 05/09/20 0743  WBC 18.1* 12.9* 10.1 10.6*  NEUTROABS 16.5*  --   --   --   HGB 13.8 11.8* 10.8* 11.9*  HCT 39.5 34.2* 31.9* 35.2*  MCV 85.1 85.9 87.4 87.6  PLT 229 178 179 229   Cardiac Enzymes: Recent Labs  Lab 05/06/20 1022 05/07/20 0254  CKTOTAL 2,126* 1,150*   BNP: Invalid input(s): POCBNP CBG: No results for input(s): GLUCAP in the last 168 hours. D-Dimer No results for input(s): DDIMER in the last 72 hours. Hgb A1c No results for input(s):  HGBA1C in the last 72 hours. Lipid Profile No results for input(s): CHOL, HDL, LDLCALC, TRIG, CHOLHDL, LDLDIRECT in the last 72 hours. Thyroid function studies Recent Labs    05/07/20 0933 05/08/20 0558  TSH 5.512*  --   T3FREE  --  1.4*   Anemia work up Recent Labs    05/07/20 0933  VITAMINB12 654   Urinalysis    Component Value Date/Time   COLORURINE YELLOW 05/06/2020 1114   APPEARANCEUR HAZY (A) 05/06/2020 1114   LABSPEC 1.017 05/06/2020 1114   PHURINE 7.0 05/06/2020 1114   GLUCOSEU 50 (A) 05/06/2020 1114   HGBUR MODERATE (A) 05/06/2020 1114   BILIRUBINUR NEGATIVE 05/06/2020 1114   KETONESUR 20 (A) 05/06/2020 1114   PROTEINUR 100 (A) 05/06/2020 1114   UROBILINOGEN 0.2 02/16/2012 1232   NITRITE POSITIVE (A) 05/06/2020 1114   LEUKOCYTESUR NEGATIVE 05/06/2020 1114   Sepsis Labs Invalid input(s): PROCALCITONIN,  WBC,  LACTICIDVEN Microbiology Recent Results (from  the past 240 hour(s))  SARS Coronavirus 2 by RT PCR (hospital order, performed in [facility] hospital lab) Nasopharyngeal Nasopharyngeal Swab     Status: None   Collection Time: 05/06/20 10:24 AM   Specimen: Nasopharyngeal Swab  Result Value Ref Range Status   SARS Coronavirus 2 NEGATIVE NEGATIVE Final    Comment: (NOTE) SARS-CoV-2 target nucleic acids are NOT DETECTED.  The SARS-CoV-2 RNA is generally detectable in upper and lower respiratory specimens during the acute phase of infection. The lowest concentration of SARS-CoV-2 viral copies this assay can detect is 250 copies / mL. A negative result does not preclude SARS-CoV-2 infection and should not be used as the sole basis for treatment or other patient management decisions.  A negative result may occur with improper specimen collection / handling, submission of specimen other than nasopharyngeal swab, presence of viral mutation(s) within the areas targeted by this assay, and inadequate number of viral copies (<250 copies / mL). A negative result  must be combined with clinical observations, patient history, and epidemiological information.  Fact Sheet for Patients:   BoilerBrush.com.cy  Fact Sheet for Healthcare Providers: https://pope.com/  This test is not yet approved or  cleared by the [place] FDA and has been authorized for detection and/or diagnosis of SARS-CoV-2 by FDA under an Emergency Use Authorization (EUA).  This EUA will remain in effect (meaning this test can be used) for the duration of the COVID-19 declaration under Section 564(b)(1) of the Act, 21 U.S.C. section 360bbb-3(b)(1), unless the authorization is terminated or revoked sooner.  Performed at [facility], [address], [place], [place] [address]   Blood Cultures (routine x 2)     Status: None (Preliminary result)   Collection Time: 05/06/20 10:26 AM   Specimen: BLOOD  Result Value Ref Range Status   Specimen Description BLOOD SITE NOT SPECIFIED  Final   Special Requests   Final    BOTTLES DRAWN AEROBIC AND ANAEROBIC Blood Culture adequate volume   Culture   Final    NO GROWTH 3 DAYS Performed at [facility], [address], [place], [place] [address]    Report Status PENDING  Incomplete  Blood Cultures (routine x 2)     Status: None (Preliminary result)   Collection Time: 05/06/20 10:28 AM   Specimen: BLOOD  Result Value Ref Range Status   Specimen Description BLOOD SITE NOT SPECIFIED  Final   Special Requests   Final    BOTTLES DRAWN AEROBIC AND ANAEROBIC Blood Culture adequate volume   Culture   Final    NO GROWTH 3 DAYS Performed at [facility], [address] [place], [place] [address]    Report Status PENDING  Incomplete  Urine culture     Status: Abnormal   Collection Time: 05/06/20 11:14 AM   Specimen: Urine, Random  Result Value Ref Range Status   Specimen Description URINE, RANDOM  Final   Special Requests   Final    NONE Performed at [facility], [address] [place], [place] [address]    Culture >=100,000 COLONIES/mL PSEUDOMONAS AERUGINOSA (A)  Final   Report Status 05/08/2020 FINAL  Final   Organism ID, Bacteria PSEUDOMONAS AERUGINOSA (A)  Final      Susceptibility   Pseudomonas aeruginosa - MIC*    CEFTAZIDIME 4 SENSITIVE Sensitive     CIPROFLOXACIN <=0.25 SENSITIVE Sensitive     GENTAMICIN <=1 SENSITIVE Sensitive     IMIPENEM 8 INTERMEDIATE  Intermediate     PIP/TAZO <=4 SENSITIVE Sensitive     CEFEPIME 2 SENSITIVE Sensitive     * >=100,000 COLONIES/mL PSEUDOMONAS AERUGINOSA     Time coordinating discharge: 40 minutes  SIGNED:   Alba Cory, MD  [facility]

## 2020-05-10 DIAGNOSIS — G9341 Metabolic encephalopathy: Secondary | ICD-10-CM

## 2020-05-10 LAB — BASIC METABOLIC PANEL
Anion gap: 8 (ref 5–15)
BUN: 7 mg/dL — ABNORMAL LOW (ref 8–23)
CO2: 24 mmol/L (ref 22–32)
Calcium: 7.8 mg/dL — ABNORMAL LOW (ref 8.9–10.3)
Chloride: 99 mmol/L (ref 98–111)
Creatinine, Ser: 0.46 mg/dL — ABNORMAL LOW (ref 0.61–1.24)
GFR calc Af Amer: >60 mL/min (ref >60)
GFR calc non Af Amer: >60 mL/min (ref >60)
Glucose, Bld: 102 mg/dL — ABNORMAL HIGH (ref 70–99)
Potassium: 3.7 mmol/L (ref 3.5–5.1)
Sodium: 131 mmol/L — ABNORMAL LOW (ref 135–145)

## 2020-05-10 LAB — MAGNESIUM: Magnesium: 1.6 mg/dL — ABNORMAL LOW (ref 1.7–2.4)

## 2020-05-10 LAB — CK: Total CK: 272 U/L (ref 49–397)

## 2020-05-10 LAB — PHOSPHORUS: Phosphorus: 2.8 mg/dL (ref 2.5–4.6)

## 2020-05-10 MED ORDER — MAGNESIUM SULFATE 2 GM/50ML IV SOLN
2.0000 g | Freq: Once | INTRAVENOUS | Status: AC
  Administered 2020-05-10: 2 g via INTRAVENOUS
  Filled 2020-05-10: qty 50

## 2020-05-10 NOTE — Progress Notes (Signed)
PROGRESS NOTE    Alan [name]  AUQ:[id number] DOB: 12-02-1945 DOA: 05/06/2020 PCP: Barbie Banner, MD   Brief Narrative: 74 year old with past medical history significant for hypertension, PTSD, schizophrenia presented with altered mental status and fall.  Patient was very confused in the ED most of the history was provided by patient niece who was at bedside.  Patient [place], niece visits him 1 or twice a week.  Patient was found on the floor by his niece.  He was awake and confused. Patient  Report that he fell down on Monday.  At baseline patient is able to ambulate with the assistance of a walker.  Patient appears more confused than his baseline.  Patient reported generalized weakness before he fell down. Evaluation in the ED UA consistent with infection, positive nitrates, white blood cells 6-10.  White blood cell 18.  CT showed cholesteatoma.   Assessment & Plan:   Active Problems:   Urinary tract infection without hematuria   Rhabdomyolysis   Pressure injury of skin   Humerus fracture   Acute metabolic encephalopathy   Hypomagnesemia  1-Rhabdomyolysis;  Nontraumatic, due to prolonged immobilization. Follow CK level trend. 2126>1150> 272 Resolved.  Stop IV fluids.  2-Acute metabolic encephalopathy Patient was alert oriented to place and name. Continue to correct electrolyte abnormalities. Continue with treatment of urinary tract infection Improving.   3-UTI, Pseudomonas, urinary retention: Patient presents with leukocytosis.  UA with positive nitrates, white blood cell 6-10. Urine culture growing Pseudomonas. Continue with cefepime day 2.  Blood cultures -No growth today.  Hypomagnesemia: Replaced today.  Hypokalemia, resolved.  Hypophosphatemia; resolved.  Heart murmur; no significant valvular disease 2D ECHO.   Left arm weakness, left LE weakness;  -Patient with lot of bruise the left arm and unable to move it.  X ray ordered showed proximal  humerus fracture.  -Discuss care with neurologist, in regards lower extremity weakness will need to first correct electrolyte derangement, if weakness persist will need formal neurology evaluation. Weakness improved.  -MRI: Negative for acute stroke. Loss of the normal flow voids of the intracranial internal carotid arteries, which may reflect slow flow or chronic occlusion. 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 These findings are likely chronic.There is segmental mild fusiform dilatation of the right posterior communicating artery possibly reflecting post-stenotic dilatation or flow related aneurysmal dilatation. -Discussed CTA finding with neurology, recommend out patient vascular follow up outpatient.   Hyponatremia; stable.  Discontinue IV fluids.  Elevated TSH;  Free T 4; 1.18  Needs repeat Thyroid function in 4 weeks.   Moderate protein caloric malnutrition; start Ensure  Cholesteatoma of right ear: Case discussed with on-call ENT Dr. Jenne Pane, recommend outpatient follow-up  Hypertension; controlled.  Continue with Norvasc, Coreg  Schizophrenia; continue with Risperdal   Pressure Ulcer 02/16/12 Stage I -  Intact skin with non-blanchable redness of a localized area usually over a bony prominence. Red (Active)  02/16/12 2050  Location: Sacrum  Location Orientation:   Staging: Stage I -  Intact skin with non-blanchable redness of a localized area usually over a bony prominence.  Wound Description (Comments): Red  Present on Admission: Yes     Pressure Injury  05/06/20 Sacrum Mid Stage 2 -  Partial thickness loss of dermis presenting as  a shallow open injury with a red, pink wound bed without slough. Partial loss of dermis,shallow red wound bed with scant serous drainage. (Active)  05/06/20 1600  Location: Sacrum  Location Orientation: Mid  Staging: Stage 2 -  Partial thickness loss of dermis presenting as a shallow open injury with a red, pink wound bed without slough.  Wound Description (Comments): Partial loss of dermis,shallow red wound bed with scant serous drainage.  Present on Admission: Yes     Nutrition Problem: Severe Malnutrition Etiology: chronic illness (CVA, esophageal stricture)    Signs/Symptoms: moderate fat depletion, severe fat depletion, moderate muscle depletion, severe muscle depletion    Interventions: Ensure Enlive (each supplement provides 350kcal and 20 grams of protein), MVI  Estimated body mass index is 21.28 kg/m as calculated from the following:   Height as of 02/16/12: 5\' 5"  (1.651 m).   Weight as of this encounter: 58 kg.   DVT prophylaxis: Lovenox Code Status: Full code Family Communication: No family at bedside Disposition Plan:  Status is: Inpatient  Remains inpatient appropriate because:Hemodynamically unstable and Persistent severe electrolyte disturbances   Dispo: The patient is from: Home              Anticipated d/c is to: SNF              Anticipated d/c date is: 2 days              Patient currently is medically stable to d/c.   Consultants:     Procedures:   Echo:  Antimicrobials:  Cefepime  Subjective: Patient seen and examined.  Complains of left arm pain.  He is alert and oriented to place and person.  With little prompts, he is mostly oriented.  Objective: Vitals:   05/09/20 1639 05/09/20 2122 05/10/20 0515 05/10/20 0918  BP: 134/72 138/74 (!) 150/76 (!) 153/70  Pulse: 80 77 80 80  Resp: 16 18 18 18   Temp: 99 F (37.2 C) 98.1 F (36.7 C) 98 F (36.7 C) 98.9 F (37.2 C)  TempSrc: Oral Oral  Oral  SpO2: 98% 94% 94% 95%  Weight:         Intake/Output Summary (Last 24 hours) at 05/10/2020 1328 Last data filed at 05/10/2020 1215 Gross per 24 hour  Intake 2612.5 ml  Output 2575 ml  Net 37.5 ml   Filed Weights   05/06/20 1931 05/07/20 2048  Weight: 55 kg 58 kg    Examination:  General exam: Appears calm and comfortable  Respiratory system: Clear to auscultation. Respiratory effort normal. Cardiovascular system: S1 & S2 heard, RRR. No JVD, murmurs, rubs, gallops or clicks. No pedal edema. Gastrointestinal system: Abdomen is nondistended, soft and nontender. No organomegaly or masses felt. Normal bowel sounds heard. Central nervous system: Alert and oriented x2. No focal neurological deficits. Extremities: Sling in left upper extremity Skin: No rashes, lesions or ulcers.  Psychiatry: Judgement and insight appear normal. Mood & affect appropriate.   Data Reviewed: I have personally reviewed following labs and imaging studies  CBC: Recent Labs  Lab 05/06/20 1020 05/07/20 0254 05/08/20 0558 05/09/20 0743  WBC 18.1* 12.9* 10.1 10.6*  NEUTROABS 16.5*  --   --   --   HGB 13.8 11.8* 10.8* 11.9*  HCT 39.5 34.2* 31.9* 35.2*  MCV 85.1 85.9 87.4 87.6  PLT 229 178 179 229   Basic Metabolic Panel: Recent Labs  Lab 05/06/20  1022 05/07/20 0254 05/08/20 0558 05/09/20 0743 05/10/20 0447  NA 128* 132* 132* 133* 131*  K 3.2* 2.7* 3.2* 3.8 3.7  CL 90* 98 99 100 99  CO2 23 21* 25 24 24   GLUCOSE 129* 95 94 129* 102*  BUN 22 19 14 11  7*  CREATININE 0.63 0.61 0.53* 0.43* 0.46*  CALCIUM 8.8* 7.8* 7.5* 7.8* 7.8*  MG  --  1.6* 2.0 1.8 1.6*  PHOS  --   --  1.4* 2.0* 2.8   GFR: CrCl cannot be calculated (Unknown ideal weight.). Liver Function Tests: Recent Labs  Lab 05/06/20 1022  AST 59*  ALT 31  ALKPHOS 65  BILITOT 1.5*  PROT 6.7  ALBUMIN 3.7   No results for input(s): LIPASE, AMYLASE in the last 168 hours. Recent Labs  Lab 05/06/20 1021  AMMONIA 20   Coagulation Profile: Recent Labs  Lab  05/06/20 1022  INR 1.1   Cardiac Enzymes: Recent Labs  Lab 05/06/20 1022 05/07/20 0254 05/10/20 0447  CKTOTAL 2,126* 1,150* 272   BNP (last 3 results) No results for input(s): PROBNP in the last 8760 hours. HbA1C: No results for input(s): HGBA1C in the last 72 hours. CBG: No results for input(s): GLUCAP in the last 168 hours. Lipid Profile: No results for input(s): CHOL, HDL, LDLCALC, TRIG, CHOLHDL, LDLDIRECT in the last 72 hours. Thyroid Function Tests: Recent Labs    05/08/20 0558  FREET4 1.18*  T3FREE 1.4*   Anemia Panel: No results for input(s): VITAMINB12, FOLATE, FERRITIN, TIBC, IRON, RETICCTPCT in the last 72 hours. Sepsis Labs: Recent Labs  Lab 05/06/20 1022  LATICACIDVEN 2.3*    Recent Results (from the past 240 hour(s))  SARS Coronavirus 2 by RT PCR (hospital order, performed in [facility] hospital lab) Nasopharyngeal Nasopharyngeal Swab     Status: None   Collection Time: 05/06/20 10:24 AM   Specimen: Nasopharyngeal Swab  Result Value Ref Range Status   SARS Coronavirus 2 NEGATIVE NEGATIVE Final    Comment: (NOTE) SARS-CoV-2 target nucleic acids are NOT DETECTED.  The SARS-CoV-2 RNA is generally detectable in upper and lower respiratory specimens during the acute phase of infection. The lowest concentration of SARS-CoV-2 viral copies this assay can detect is 250 copies / mL. A negative result does not preclude SARS-CoV-2 infection and should not be used as the sole basis for treatment or other patient management decisions.  A negative result may occur with improper specimen collection / handling, submission of specimen other than nasopharyngeal swab, presence of viral mutation(s) within the areas targeted by this assay, and inadequate number of viral copies (<250 copies / mL). A negative result must be combined with clinical observations, patient history, and epidemiological information.  Fact Sheet for Patients:    [facility]  Fact Sheet for Healthcare Providers: 05/08/20  This test is not yet approved or  cleared by the [employer] FDA and has been authorized for detection and/or diagnosis of SARS-CoV-2 by FDA under an Emergency Use Authorization (EUA).  This EUA will remain in effect (meaning this test can be used) for the duration of the COVID-19 declaration under Section 564(b)(1) of the Act, 21 U.S.C. section 360bbb-3(b)(1), unless the authorization is terminated or revoked sooner.  Performed at [facility], [address], [place], [address] [place]   Blood Cultures (routine x 2)     Status: None (Preliminary result)   Collection Time: 05/06/20 10:26 AM   Specimen: BLOOD  Result Value Ref Range Status   Specimen Description  BLOOD SITE NOT SPECIFIED  Final   Special Requests   Final    BOTTLES DRAWN AEROBIC AND ANAEROBIC Blood Culture adequate volume   Culture   Final    NO GROWTH 4 DAYS Performed at [facility], [address] [place], [place] [address]    Report Status PENDING  Incomplete  Blood Cultures (routine x 2)     Status: None (Preliminary result)   Collection Time: 05/06/20 10:28 AM   Specimen: BLOOD  Result Value Ref Range Status   Specimen Description BLOOD SITE NOT SPECIFIED  Final   Special Requests   Final    BOTTLES DRAWN AEROBIC AND ANAEROBIC Blood Culture adequate volume   Culture   Final    NO GROWTH 4 DAYS Performed at [facility], [address] [place], [place] [address]    Report Status PENDING  Incomplete  Urine culture     Status: Abnormal   Collection Time: 05/06/20 11:14 AM   Specimen: Urine, Random  Result Value Ref Range Status   Specimen Description URINE, RANDOM  Final   Special Requests   Final    NONE Performed at [facility], [address] [place], [place] [address]    Culture >=100,000 COLONIES/mL PSEUDOMONAS AERUGINOSA (A)  Final   Report  Status 05/08/2020 FINAL  Final   Organism ID, Bacteria PSEUDOMONAS AERUGINOSA (A)  Final      Susceptibility   Pseudomonas aeruginosa - MIC*    CEFTAZIDIME 4 SENSITIVE Sensitive     CIPROFLOXACIN <=0.25 SENSITIVE Sensitive     GENTAMICIN <=1 SENSITIVE Sensitive     IMIPENEM 8 INTERMEDIATE Intermediate     PIP/TAZO <=4 SENSITIVE Sensitive     CEFEPIME 2 SENSITIVE Sensitive     * >=100,000 COLONIES/mL PSEUDOMONAS AERUGINOSA         Radiology Studies: No results found.      Scheduled Meds:  amLODipine  5 mg Oral q morning - 10a   aspirin EC  325 mg Oral Daily   carvedilol  12.5 mg Oral BID WC   Chlorhexidine Gluconate Cloth  6 each Topical Daily   enoxaparin (LOVENOX) injection  40 mg Subcutaneous Q24H   feeding supplement (ENSURE ENLIVE)  237 mL Oral TID BM   multivitamin with minerals  1 tablet Oral Daily   risperiDONE  1 mg Oral Daily   vitamin E  400 Units Oral q morning - 10a   Continuous Infusions:  sodium chloride 100 mL/hr at 05/10/20 1215   ceFEPime (MAXIPIME) IV Stopped (05/10/20 0240)     LOS: 4 days    Time spent: 36 minutes.     Alan Clossavi Johnavon Mcclafferty, MD [facility]   If 7PM-7AM, please contact night-coverage [email or web address]  05/10/2020, 1:28 PM

## 2020-05-10 NOTE — Plan of Care (Signed)
  Problem: Clinical Measurements: Goal: Respiratory complications will improve Outcome: Completed/Met Goal: Cardiovascular complication will be avoided Outcome: Completed/Met

## 2020-05-10 NOTE — Plan of Care (Signed)
  Problem: Education: Goal: Knowledge of General Education information will improve Description: Including pain rating scale, medication(s)/side effects and non-pharmacologic comfort measures Outcome: Progressing   Problem: Activity: Goal: Risk for activity intolerance will decrease Outcome: Progressing   Problem: Coping: Goal: Level of anxiety will decrease Outcome: Progressing   

## 2020-05-11 LAB — CULTURE, BLOOD (ROUTINE X 2)
Culture: NO GROWTH
Culture: NO GROWTH
Special Requests: ADEQUATE
Special Requests: ADEQUATE

## 2020-05-11 LAB — MAGNESIUM: Magnesium: 2 mg/dL (ref 1.7–2.4)

## 2020-05-11 NOTE — TOC Initial Note (Addendum)
Transition of Care [facility]) - Initial/Assessment Note    Patient Details  Name: Alan [name] MRN: [id number] Date of Birth: [birth year]  [facility]) CM/SW Contact:    Cristobal Goldmann, LCSW Phone Number: 05/11/2020, 6:14 PM  Clinical Narrative:  CSW visited with patient at the bedside on 7/21 to talk with him regarding recommendation of ST rehab. Patient was lying in bed and was alert, oriented and agreeable to talking with CSW. Patient's speech was garbled and very hard to understand and he gave CSW permission to call his sister-in-law [place] [phone]). Mrs. Steiner was called while in room with patient and she was advised of recommendation of ST rehab.   Mrs. Walmer reported that her husband Fredrik Cove is patient's brother and he died in 28-Apr-2023. She added that patient has one other brother, Smitty Cords and the other contact we have - Meril Dray is her son. CSW informed that she is patent's payee for his SSA and manages his finances. CSW advised Mrs. Bomba regarding recommendation of ST rehab and she expressed agreement and added that patient has been to [facility] about 10 years ago after breaking his leg. She expressed that patient will agree to go to SNF if needed. When asked, Mrs. Hamon reported that patient has had one COVID vaccination at his doctor's office in [place] and the second shot was scheduled, but since he is in the hospital, she has rescheduled it for 7/29.  CSW explained facility search process, and [email or web address] and she would like for patient to discharge to [facility] for rehab.       On 7.22 [employer] contacted and insurance auth initiated - Ref [id number], with facility choice pending. Clinicals faxed to [employer].     Expected Discharge Plan: Skilled Nursing Facility Barriers to Discharge: Insurance Authorization, Other (comment) (SNF preference given. Faxed out)   Patient Goals and CMS Choice Patient states their goals for this hospitalization and ongoing  recovery are:: Patient and family agreeable to ST rehab CMS Medicare.gov Compare Post Acute Care list provided to:: Patient Represenative (must comment) (Sister-in-law, Mrs. Olivencia informed about Medicare.gov to get information regarding ratings, etc. on nursing homes) Choice offered to / list presented to : Patient (Talked with patient at the bedside and sister-in-law Mrs. Nedra Hai by phone)  Expected Discharge Plan and Services Expected Discharge Plan: Skilled Nursing Facility       Living arrangements for the past 2 months: Single Family Home                                      Prior Living Arrangements/Services Living arrangements for the past 2 months: Single Family Home Lives with:: Self Patient language and need for interpreter reviewed:: No Do you feel safe going back to the place where you live?: No   Patient and family agreeable to ST rehab before returning home  Need for Family Participation in Patient Care: Yes (Comment) Care giver support system in place?: No (comment)   Criminal Activity/Legal Involvement Pertinent to Current Situation/Hospitalization: No - Comment as needed  Activities of Daily Living      Permission Sought/Granted Permission sought to share information with : Family Supports Permission granted to share information with : Yes, Verbal Permission Granted  Share Information with NAME: Gianni Mihalik     Permission granted to share info w Relationship: Sister-in-law  Permission granted to share info w Contact Information: [phone]  Emotional Assessment  Appearance:: Appears younger than stated age Attitude/Demeanor/Rapport: Engaged Affect (typically observed): Appropriate Orientation: : Oriented to Self, Oriented to Place Alcohol / Substance Use: Tobacco Use, Alcohol Use, Illicit Drugs (Per H&P patient smokes and does not drink or use illicit drugs) Psych Involvement: No (comment)  Admission diagnosis:  Rhabdomyolysis [M62.82] Left hip pain  [M25.552] Right hip pain [M25.551] Elevated CK [R74.8] Cholesteatoma, unspecified laterality [H71.90] Urinary tract infection without hematuria, site unspecified [N39.0] Altered mental status, unspecified altered mental status type [R41.82] Patient Active Problem List   Diagnosis Date Noted  . Humerus fracture 05/09/2020  . Acute metabolic encephalopathy 05/09/2020  . Hypomagnesemia 05/09/2020  . Pressure injury of skin 05/07/2020  . Rhabdomyolysis 05/06/2020  . Altered mental status   . Elevated CK   . Urinary tract infection without hematuria 02/16/2012  . Generalized weakness 02/16/2012  . HTN (hypertension) 02/16/2012  . Cough 02/16/2012  . Tobacco abuse 02/16/2012  . Severe protein-calorie malnutrition (HCC) 02/16/2012  . Hyponatremia 02/16/2012  . Hypokalemia 02/16/2012  . Dehydration with hyponatremia 02/16/2012  . Schizophrenia (HCC) 02/16/2012  . Depression 02/16/2012   PCP:  Barbie Banner, MD Pharmacy:   [facility] [address], [place] - [phone] [place] [address] Phone: [phone] Fax: [phone]     Social Determinants of Health (SDOH) Interventions  No SDOH interventions requested or needed at this time.  Readmission Risk Interventions No flowsheet data found.

## 2020-05-11 NOTE — Plan of Care (Signed)
  Problem: Education: Goal: Knowledge of General Education information will improve Description: Including pain rating scale, medication(s)/side effects and non-pharmacologic comfort measures Outcome: Completed/Met   Problem: Health Behavior/Discharge Planning: Goal: Ability to manage health-related needs will improve Outcome: Completed/Met   Problem: Clinical Measurements: Goal: Ability to maintain clinical measurements within normal limits will improve Outcome: Completed/Met Goal: Diagnostic test results will improve Outcome: Completed/Met   Problem: Nutrition: Goal: Adequate nutrition will be maintained Outcome: Completed/Met   Problem: Elimination: Goal: Will not experience complications related to bowel motility Outcome: Completed/Met Goal: Will not experience complications related to urinary retention Outcome: Completed/Met   Problem: Pain Managment: Goal: General experience of comfort will improve Outcome: Completed/Met   Problem: Urinary Elimination: Goal: Signs and symptoms of infection will decrease Outcome: Completed/Met

## 2020-05-11 NOTE — Progress Notes (Signed)
PROGRESS NOTE    Alan [name]  FMB:[id number] DOB: 13-Apr-1946 DOA: 05/06/2020 PCP: Barbie Banner, MD   Brief Narrative: 74 year old with past medical history significant for hypertension, PTSD, schizophrenia presented with altered mental status and fall.  Patient was very confused in the ED most of the history was provided by patient niece who was at bedside.  Patient [place] by himself [place], niece visits him 1 or twice a week.  Patient was found on the floor by his niece.  He was awake and confused. Patient  Report that he fell down on Monday.  At baseline patient is able to ambulate with the assistance of a walker.  Patient appears more confused than his baseline.  Patient reported generalized weakness before he fell down. Evaluation in the ED UA consistent with infection, positive nitrates, white blood cells 6-10.  White blood cell 18.  CT showed cholesteatoma.   Assessment & Plan:   Active Problems:   Urinary tract infection without hematuria   Rhabdomyolysis   Pressure injury of skin   Humerus fracture   Acute metabolic encephalopathy   Hypomagnesemia  1-Rhabdomyolysis;  Nontraumatic, due to prolonged immobilization. Follow CK level trend. 2126>1150> 272 Resolved.    2-Acute metabolic encephalopathy Patient was alert and fully oriented today. Continue to correct electrolyte abnormalities. Continue with treatment of urinary tract infection Improving.   3-UTI, Pseudomonas, urinary retention: Patient presents with leukocytosis.  UA with positive nitrates, white blood cell 6-10. Urine culture growing Pseudomonas. Continue with cefepime day 4.  Blood cultures -No growth today.  Hypomagnesemia: Replaced today.  Hypokalemia, resolved.  Hypophosphatemia; resolved.  Heart murmur; no significant valvular disease 2D ECHO.   Left arm weakness, left LE weakness;  -Patient with lot of bruise the left arm and unable to move it.  X ray ordered showed proximal humerus fracture.   -Discuss care with neurologist, in regards lower extremity weakness will need to first correct electrolyte derangement, if weakness persist will need formal neurology evaluation. Weakness improved.  -MRI: Negative for acute stroke. Loss of the normal flow voids of the intracranial internal carotid arteries, which may reflect slow flow or chronic occlusion. 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 These findings are likely chronic.There is segmental mild fusiform dilatation of the right posterior communicating artery possibly reflecting post-stenotic dilatation or flow related aneurysmal dilatation. -Discussed CTA finding with neurology, recommend out patient vascular follow up outpatient.   Hyponatremia; stable.  Discontinue IV fluids.  Elevated TSH;  Free T 4; 1.18  Needs repeat Thyroid function in 4 weeks.   Moderate protein caloric malnutrition; start Ensure  Cholesteatoma of right ear: Case discussed with on-call ENT Dr. Jenne Pane, recommend outpatient follow-up  Hypertension; controlled.  Continue with Norvasc, Coreg  Schizophrenia; continue with Risperdal   Pressure Injury 05/06/20 Sacrum Mid Stage 2 -  Partial thickness loss of dermis presenting as a shallow open injury with a red, pink wound bed without slough. Partial loss of dermis,shallow red wound bed with scant serous drainage. (Active)  05/06/20 1600  Location: Sacrum  Location Orientation: Mid  Staging: Stage 2 -  Partial thickness loss of dermis presenting as a shallow open injury with a red, pink wound  bed without slough.  Wound Description (Comments): Partial loss of  dermis,shallow red wound bed with scant serous drainage.  Present on Admission: Yes     Nutrition Problem: Severe Malnutrition Etiology: chronic illness (CVA, esophageal stricture)    Signs/Symptoms: moderate fat depletion, severe fat depletion, moderate muscle depletion, severe muscle depletion    Interventions: Ensure Enlive (each supplement provides 350kcal and 20 grams of protein), MVI  Estimated body mass index is 21.28 kg/m as calculated from the following:   Height as of 02/16/12: 5\' 5"  (1.651 m).   Weight as of this encounter: 58 kg.   DVT prophylaxis: Lovenox Code Status: Full code Family Communication: No family at bedside Disposition Plan:  Status is: Inpatient  Remains inpatient appropriate because:Hemodynamically unstable and Persistent severe electrolyte disturbances   Dispo: The patient is from: Home              Anticipated d/c is to: SNF              Anticipated d/c date is: Unknown.  Waiting for bed placement.              Patient currently is medically stable to d/c since 05/09/2020.  Waiting for placement.   Consultants:     Procedures:   Echo:  Antimicrobials:  Cefepime  Subjective: Patient seen and examined.  No complaint.  He was alert and oriented.  Objective: Vitals:   05/10/20 1700 05/10/20 2058 05/11/20 0511 05/11/20 0821  BP: (!) 148/73 120/89 (!) 165/77 (!) 139/94  Pulse: 78 74 80 80  Resp: 18 17 20 16   Temp: 98.8 F (37.1 C) 98.8 F (37.1 C) 98 F (36.7 C) 98.8 F (37.1 C)  TempSrc: Oral Oral Oral Oral  SpO2: 96% 97% 94% 96%  Weight:        Intake/Output Summary (Last 24 hours) at 05/11/2020 1317 Last data filed at 05/11/2020 0900 Gross per 24 hour  Intake 1027 ml  Output 2926 ml  Net -1899 ml   Filed Weights   05/06/20 1931 05/07/20 2048  Weight: 55 kg 58 kg    Examination:  General exam: Appears calm and comfortable  Respiratory system: Clear to auscultation. Respiratory effort normal. Cardiovascular  system: S1 & S2 heard, RRR. No JVD, murmurs, rubs, gallops or clicks. No pedal edema. Gastrointestinal system: Abdomen is nondistended, soft and nontender. No organomegaly or masses felt. Normal bowel sounds heard. Central nervous system: Alert and oriented. No focal neurological deficits. Extremities: Symmetric 5 x 5 power.  Left upper extremity in sling. Skin: No rashes, lesions or ulcers.  Psychiatry: Judgement and insight appear normal. Mood & affect appropriate.     Data Reviewed: I have personally reviewed following labs and imaging studies  CBC: Recent Labs  Lab 05/06/20 1020 05/07/20 0254 05/08/20 0558 05/09/20 0743  WBC 18.1* 12.9* 10.1 10.6*  NEUTROABS 16.5*  --   --   --   HGB 13.8 11.8* 10.8* 11.9*  HCT 39.5 34.2* 31.9* 35.2*  MCV 85.1 85.9 87.4 87.6  PLT 229 178 179 229   Basic Metabolic Panel: Recent Labs  Lab 05/06/20 1022 05/07/20 0254 05/08/20 0558 05/09/20 0743 05/10/20 0447 05/11/20 0409  NA 128* 132* 132* 133* 131*  --   K 3.2* 2.7* 3.2* 3.8 3.7  --   CL 90* 98 99 100 99  --   CO2 23 21* 25 24 24   --   GLUCOSE 129* 95 94 129* 102*  --   BUN 22 19 14  11  7*  --   CREATININE 0.63 0.61 0.53* 0.43* 0.46*  --   CALCIUM 8.8* 7.8* 7.5* 7.8* 7.8*  --   MG  --  1.6* 2.0 1.8 1.6* 2.0  PHOS  --   --  1.4* 2.0* 2.8  --    GFR: CrCl cannot be calculated (Unknown ideal weight.). Liver Function Tests: Recent Labs  Lab 05/06/20 1022  AST 59*  ALT 31  ALKPHOS 65  BILITOT 1.5*  PROT 6.7  ALBUMIN 3.7   No results for input(s): LIPASE, AMYLASE in the last 168 hours. Recent Labs  Lab 05/06/20 1021  AMMONIA 20   Coagulation Profile: Recent Labs  Lab 05/06/20 1022  INR 1.1   Cardiac Enzymes: Recent Labs  Lab 05/06/20 1022 05/07/20 0254 05/10/20 0447  CKTOTAL 2,126* 1,150* 272   BNP (last 3 results) No results for input(s): PROBNP in the last 8760 hours. HbA1C: No results for input(s): HGBA1C in the last 72 hours. CBG: No results for  input(s): GLUCAP in the last 168 hours. Lipid Profile: No results for input(s): CHOL, HDL, LDLCALC, TRIG, CHOLHDL, LDLDIRECT in the last 72 hours. Thyroid Function Tests: No results for input(s): TSH, T4TOTAL, FREET4, T3FREE, THYROIDAB in the last 72 hours. Anemia Panel: No results for input(s): VITAMINB12, FOLATE, FERRITIN, TIBC, IRON, RETICCTPCT in the last 72 hours. Sepsis Labs: Recent Labs  Lab 05/06/20 1022  LATICACIDVEN 2.3*    Recent Results (from the past 240 hour(s))  SARS Coronavirus 2 by RT PCR (hospital order, performed in [facility] hospital lab) Nasopharyngeal Nasopharyngeal Swab     Status: None   Collection Time: 05/06/20 10:24 AM   Specimen: Nasopharyngeal Swab  Result Value Ref Range Status   SARS Coronavirus 2 NEGATIVE NEGATIVE Final    Comment: (NOTE) SARS-CoV-2 target nucleic acids are NOT DETECTED.  The SARS-CoV-2 RNA is generally detectable in upper and lower respiratory specimens during the acute phase of infection. The lowest concentration of SARS-CoV-2 viral copies this assay can detect is 250 copies / mL. A negative result does not preclude SARS-CoV-2 infection and should not be used as the sole basis for treatment or other patient management decisions.  A negative result may occur with improper specimen collection / handling, submission of specimen other than nasopharyngeal swab, presence of viral mutation(s) within the areas targeted by this assay, and inadequate number of viral copies (<250 copies / mL). A negative result must be combined with clinical observations, patient history, and epidemiological information.  Fact Sheet for Patients:   BoilerBrush.com.cy  Fact Sheet for Healthcare Providers: https://pope.com/  This test is not yet approved or  cleared by the [place] FDA and has been authorized for detection and/or diagnosis of SARS-CoV-2 by FDA under an Emergency Use Authorization  (EUA).  This EUA will remain in effect (meaning this test can be used) for the duration of the COVID-19 declaration under Section 564(b)(1) of the Act, 21 U.S.C. section 360bbb-3(b)(1), unless the authorization is terminated or revoked sooner.  Performed at [facility], [address] [place], [place] [address]   Blood Cultures (routine x 2)     Status: None (Preliminary result)   Collection Time: 05/06/20 10:26 AM   Specimen: BLOOD  Result Value Ref Range Status   Specimen Description BLOOD SITE NOT SPECIFIED  Final   Special Requests   Final    BOTTLES DRAWN AEROBIC AND ANAEROBIC Blood Culture adequate volume   Culture   Final    NO GROWTH 4 DAYS  Performed at [facility], [address] [place], [place] [address]    Report Status PENDING  Incomplete  Blood Cultures (routine x 2)     Status: None (Preliminary result)   Collection Time: 05/06/20 10:28 AM   Specimen: BLOOD  Result Value Ref Range Status   Specimen Description BLOOD SITE NOT SPECIFIED  Final   Special Requests   Final    BOTTLES DRAWN AEROBIC AND ANAEROBIC Blood Culture adequate volume   Culture   Final    NO GROWTH 4 DAYS Performed at [facility], [address] [place], [place] [address]    Report Status PENDING  Incomplete  Urine culture     Status: Abnormal   Collection Time: 05/06/20 11:14 AM   Specimen: Urine, Random  Result Value Ref Range Status   Specimen Description URINE, RANDOM  Final   Special Requests   Final    NONE Performed at [facility], [address] [place], [place] [address]    Culture >=100,000 COLONIES/mL PSEUDOMONAS AERUGINOSA (A)  Final   Report Status 05/08/2020 FINAL  Final   Organism ID, Bacteria PSEUDOMONAS AERUGINOSA (A)  Final      Susceptibility   Pseudomonas aeruginosa - MIC*    CEFTAZIDIME 4 SENSITIVE Sensitive     CIPROFLOXACIN <=0.25 SENSITIVE Sensitive     GENTAMICIN <=1 SENSITIVE Sensitive     IMIPENEM 8 INTERMEDIATE Intermediate       PIP/TAZO <=4 SENSITIVE Sensitive     CEFEPIME 2 SENSITIVE Sensitive     * >=100,000 COLONIES/mL PSEUDOMONAS AERUGINOSA         Radiology Studies: No results found.      Scheduled Meds: . amLODipine  5 mg Oral q morning - 10a  . aspirin EC  325 mg Oral Daily  . carvedilol  12.5 mg Oral BID WC  . Chlorhexidine Gluconate Cloth  6 each Topical Daily  . enoxaparin (LOVENOX) injection  40 mg Subcutaneous Q24H  . feeding supplement (ENSURE ENLIVE)  237 mL Oral TID BM  . multivitamin with minerals  1 tablet Oral Daily  . risperiDONE  1 mg Oral Daily  . vitamin E  400 Units Oral q morning - 10a   Continuous Infusions: . ceFEPime (MAXIPIME) IV 2 g (05/11/20 0110)     LOS: 5 days    Time spent: 28 minutes.     Alan Clossavi Shlomo Seres, MD [facility]   If 7PM-7AM, please contact night-coverage [email or web address]  05/11/2020, 1:17 PM

## 2020-05-11 NOTE — Progress Notes (Signed)
Occupational Therapy Treatment Patient Details Name: Alan [name] MRN: [id number] DOB: 10/12/1946 Today's Date: 05/11/2020    History of present illness Alan [name] is a 74 y.o. male with medical history significant of HTN,  PTSD, schizophrenia, presented with altered mental status and fall.  Patient lives by himself in a ranch, niece lives close, and neighbor who visit him 1-2 times a week.  Niece visited him this morning found him on the floor, lying down, awake and confused. Unsure how long he was down; found to have Rhabdomyolysis; Beuising L UE, and CT revealed an impacted fracture of the proximal humerus without significant displacement.   OT comments  Pt progressing with OT goals and demonstrated improved sitting balance EOB today during ADLs. Pt overall Min to Mod A for sponge bathing and dressing tasks sitting EOB. Pt continues to require frequent cues to avoid L UE use, reporting some discomfort in L shoulder today. Pt continues to be limited by decreased cognition, poor adherence to L UE precations, decreased strength, low endurance, and poor standing balance. Plan to further address ADL transfers during next session.    Follow Up Recommendations  SNF    Equipment Recommendations  3 in 1 bedside commode    Recommendations for Other Services      Precautions / Restrictions Precautions Precautions: Fall;Other (comment) Precaution Comments: L UE sling Restrictions Weight Bearing Restrictions: Yes LUE Weight Bearing: Non weight bearing       Mobility Bed Mobility Overal bed mobility: Needs Assistance Bed Mobility: Supine to Sit;Sit to Supine     Supine to sit: Mod assist;HOB elevated Sit to supine: Mod assist;HOB elevated   General bed mobility comments: Mod A with HHA to advance trunk to EOB and B LE back into bed  Transfers                      Balance Overall balance assessment: Needs assistance Sitting-balance support: Single extremity supported;Feet  supported Sitting balance-Leahy Scale: Fair Sitting balance - Comments: static sititng balance improved today, UE support needed for dynamic tasks with pt reaching for bed rail with R UE                                   ADL either performed or assessed with clinical judgement   ADL Overall ADL's : Needs assistance/impaired     Grooming: Set up;Sitting;Wash/dry face;Brushing hair Grooming Details (indicate cue type and reason): Setup to brush hair and wash face sitting EOB Upper Body Bathing: Minimal assistance;Sitting Upper Body Bathing Details (indicate cue type and reason): Min A for UB bathing sitting EOB, assistance needed for back Lower Body Bathing: Moderate assistance;Sitting/lateral leans;Bed level Lower Body Bathing Details (indicate cue type and reason): Overall Mod A for LB bathing sitting EOB and bed level. Assistance needed for B feet and peri care bed level due to bowel incontinence Upper Body Dressing : Minimal assistance;Sitting Upper Body Dressing Details (indicate cue type and reason): Min A to don hospital gown sitting EOB with frequent cues needed to avoid L UE use Lower Body Dressing: Moderate assistance;Sitting/lateral leans Lower Body Dressing Details (indicate cue type and reason): Mod A overall for LB dressing. Pt able to don socks with one handed technique with assistance needed to get each sock started around feet               General ADL Comments: Pt with improved sitting balance  EOB during ADLs, but continues to require frequent cues to maintain L UE precautions     Vision   Vision Assessment?: No apparent visual deficits   Perception     Praxis      Cognition Arousal/Alertness: Awake/alert Behavior During Therapy: WFL for tasks assessed/performed Overall Cognitive Status: History of cognitive impairments - at baseline Area of Impairment: Orientation;Attention;Memory;Following commands;Safety/judgement;Awareness;Problem solving                  Orientation Level: Time (reports January) Current Attention Level: Sustained Memory: Decreased recall of precautions;Decreased short-term memory Following Commands: Follows one step commands consistently Safety/Judgement: Decreased awareness of safety;Decreased awareness of deficits Awareness: Emergent Problem Solving: Slow processing;Difficulty sequencing;Requires verbal cues;Requires tactile cues General Comments: Pt oriented to person, place and situation. Pt requires consistent multimodal cues to avoid using L UE throughout session, unable to follow precautions        Exercises     Shoulder Instructions       General Comments ADLs completed sitting EOB today. Pt continues with flat affect but improved sequencing and always willing to attempt activities with OTq    Pertinent Vitals/ Pain       Pain Assessment: Faces Faces Pain Scale: Hurts little more Pain Location: L shoulder with movement Pain Descriptors / Indicators: Grimacing;Guarding Pain Intervention(s): Limited activity within patient's tolerance;Monitored during session  Home Living                                          Prior Functioning/Environment              Frequency  Min 2X/week        Progress Toward Goals  OT Goals(current goals can now be found in the care plan section)  Progress towards OT goals: Progressing toward goals  Acute Rehab OT Goals Patient Stated Goal: none stated OT Goal Formulation: With patient Time For Goal Achievement: 05/22/20 Potential to Achieve Goals: Good ADL Goals Pt Will Perform Upper Body Bathing: with min assist;sitting Pt Will Perform Lower Body Bathing: with mod assist;sitting/lateral leans;sit to/from stand Pt Will Perform Upper Body Dressing: with min assist;sitting Pt Will Perform Lower Body Dressing: with mod assist;sitting/lateral leans;sit to/from stand Pt Will Transfer to Toilet: with mod assist;stand pivot  transfer;bedside commode Additional ADL Goal #1: Pt to demonstrate ability to maintain sitting balance EOB > 5 minutes with no more than supervision required in order to maximize ADL performance Additional ADL Goal #2: Pt to demonstrate ability to follow L UE precautions with no more than min verbal cues required to maximize safety and healing.  Plan Discharge plan remains appropriate    Co-evaluation                 AM-PAC OT "6 Clicks" Daily Activity     Outcome Measure   Help from another person eating meals?: A Little Help from another person taking care of personal grooming?: A Little Help from another person toileting, which includes using toliet, bedpan, or urinal?: Total Help from another person bathing (including washing, rinsing, drying)?: A Lot Help from another person to put on and taking off regular upper body clothing?: A Little Help from another person to put on and taking off regular lower body clothing?: A Lot 6 Click Score: 14    End of Session    OT Visit Diagnosis: Unsteadiness on feet (R26.81);Other abnormalities of  gait and mobility (R26.89);Muscle weakness (generalized) (M62.81);History of falling (Z91.81)   Activity Tolerance Patient tolerated treatment well   Patient Left in bed;with call bell/phone within reach   Nurse Communication Mobility status        Time: 7672-0947 OT Time Calculation (min): 29 min  Charges: OT General Charges $OT Visit: 1 Visit OT Treatments $Self Care/Home Management : 23-37 mins  Lorre Munroe, OTR/L   Lorre Munroe 05/11/2020, 10:02 AM

## 2020-05-11 NOTE — Plan of Care (Signed)
  Problem: Clinical Measurements: Goal: Will remain free from infection Outcome: Progressing   Problem: Coping: Goal: Level of anxiety will decrease Outcome: Progressing   Problem: Skin Integrity: Goal: Risk for impaired skin integrity will decrease Outcome: Progressing   

## 2020-05-11 NOTE — [place] FL2 (Signed)
MEDICAID FL2 LEVEL OF CARE SCREENING TOOL     IDENTIFICATION  Patient Name: Alan [name] Birthdate: 08-28-1946 Sex: male Admission Date (Current Location): 05/06/2020  [facility] and [place] Number:  Producer, television/film/video and Address:  [facility], [address] [place], [place] [address]      Provider Number: [id number]  Attending Physician Name and Address:  Hughie Closs, MD  Relative Name and Phone Number:  Javares Kaufhold - sister-in -law - [phone]    Current Level of Care: Hospital Recommended Level of Care: Skilled Nursing Facility Prior Approval Number:    Date Approved/Denied:   PASRR Number: [id number]  Discharge Plan: SNF    Current Diagnoses: Patient Active Problem List   Diagnosis Date Noted  . Humerus fracture 05/09/2020  . Acute metabolic encephalopathy 05/09/2020  . Hypomagnesemia 05/09/2020  . Pressure injury of skin 05/07/2020  . Rhabdomyolysis 05/06/2020  . Altered mental status   . Elevated CK   . Urinary tract infection without hematuria 02/16/2012  . Generalized weakness 02/16/2012  . HTN (hypertension) 02/16/2012  . Cough 02/16/2012  . Tobacco abuse 02/16/2012  . Severe protein-calorie malnutrition (HCC) 02/16/2012  . Hyponatremia 02/16/2012  . Hypokalemia 02/16/2012  . Dehydration with hyponatremia 02/16/2012  . Schizophrenia (HCC) 02/16/2012  . Depression 02/16/2012    Orientation RESPIRATION BLADDER Height & Weight     Self, Place  Normal Continent Weight: 127 lb 13.9 oz (58 kg) Height:     BEHAVIORAL SYMPTOMS/MOOD NEUROLOGICAL BOWEL NUTRITION STATUS      Incontinent Diet (Heart healthy)  AMBULATORY STATUS COMMUNICATION OF NEEDS Skin   Extensive Assist (Mod assist per PT) Verbally Normal                       Personal Care Assistance Level of Assistance  Bathing, Feeding, Dressing Bathing Assistance: Maximum assistance Feeding assistance: Limited assistance Dressing Assistance: Maximum  assistance     Functional Limitations Info  Sight, Hearing, Speech Sight Info: Adequate Hearing Info: Adequate Speech Info: Impaired (Difficult to understand)    SPECIAL CARE FACTORS FREQUENCY  PT (By licensed PT), OT (By licensed OT)     PT Frequency: Evaluated 7/18. PT at SNF eval and treat, a minimum of 5 days per week OT Frequency: Evaluated 7/19. OT at SNF eval and treat, aminimum of 5 days per week            Contractures Contractures Info: Not present    Additional Factors Info  Code Status, Allergies Code Status Info: Full Allergies Info: No known allergies           Current Medications (05/11/2020):  This is the current hospital active medication list Current Facility-Administered Medications  Medication Dose Route Frequency Provider Last Rate Last Admin  . acetaminophen (TYLENOL) tablet 650 mg  650 mg Oral Q6H PRN Mikey College T, MD   650 mg at 05/08/20 2149   Or  . acetaminophen (TYLENOL) suppository 650 mg  650 mg Rectal Q6H PRN Mikey College T, MD      . amLODipine (NORVASC) tablet 5 mg  5 mg Oral q morning - 10a Emeline General, MD   5 mg at 05/11/20 [phone]  . aspirin EC tablet 325 mg  325 mg Oral Daily Mikey College T, MD   325 mg at 05/11/20 [phone]  . carvedilol (COREG) tablet 12.5 mg  12.5 mg Oral BID WC Mikey College T, MD   12.5 mg at 05/11/20 0842  .  ceFEPIme (MAXIPIME) 2 g in sodium chloride 0.9 % 100 mL IVPB  2 g Intravenous Q12H Norva Pavlov, RPH 200 mL/hr at 05/11/20 0110 2 g at 05/11/20 0110  . Chlorhexidine Gluconate Cloth 2 % PADS 6 each  6 each Topical Daily Regalado, Belkys A, MD   6 each at 05/11/20 0900  . enoxaparin (LOVENOX) injection 40 mg  40 mg Subcutaneous Q24H Mikey College T, MD   40 mg at 05/10/20 1335  . feeding supplement (ENSURE ENLIVE) (ENSURE ENLIVE) liquid 237 mL  237 mL Oral TID BM Regalado, Belkys A, MD   237 mL at 05/11/20 0900  . LORazepam (ATIVAN) tablet 0.5 mg  0.5 mg Oral Q6H PRN Mikey College T, MD   0.5 mg at 05/08/20 2149  .  multivitamin with minerals tablet 1 tablet  1 tablet Oral Daily Regalado, Belkys A, MD   1 tablet at 05/11/20 0842  . risperiDONE (RISPERDAL) tablet 1 mg  1 mg Oral Daily Mikey College T, MD   1 mg at 05/11/20 0843  . vitamin E capsule 400 Units  400 Units Oral q morning - 10a Emeline General, MD   400 Units at 05/11/20 1749     Discharge Medications: Please see discharge summary for a list of discharge medications.  Relevant Imaging Results:  Relevant Lab Results:   Additional Information ss#[id number]. Patient has 1st COVID vaccination at [facility] and is due to have 2nd shot on 7/29.  Cristobal Goldmann, LCSW

## 2020-05-11 NOTE — Progress Notes (Signed)
Physical Therapy Treatment Patient Details Name: Alto Gandolfo MRN: [id number] DOB: [birth year] Today's Date: 05/11/2020    History of Present Illness Pt is a 74 y/o male admitted from home after being found down on the floor, awake but confused. Pt found to have rhabdomyolosis and a proximal L humerus fx which Ortho is treating in a non-operative conservative manner. PMH including but not limited to HTN, PTSD, schizophrenia.    PT Comments    Pt seen for mobility progression; however, he remains very limited overall secondary to weakness and cognitive deficits. He continues to require heavy physical assistance for bed mobility and transfers. Unable to participate in gait training this session. Pt would continue to benefit from skilled physical therapy services at this time while admitted and after d/c to address the below listed limitations in order to improve overall safety and independence with functional mobility.    Follow Up Recommendations  SNF     Equipment Recommendations  None recommended by PT    Recommendations for Other Services       Precautions / Restrictions Precautions Precautions: Fall;Other (comment) Precaution Comments: L UE sling Restrictions Weight Bearing Restrictions: Yes LUE Weight Bearing: Non weight bearing    Mobility  Bed Mobility Overal bed mobility: Needs Assistance Bed Mobility: Supine to Sit     Supine to sit: Mod assist;+2 for physical assistance     General bed mobility comments: multimodal cueing required, assistance needed for trunk elevation and to scoot hips forwards towards EOB  Transfers Overall transfer level: Needs assistance Equipment used: 2 person hand held assist Transfers: Sit to/from [employer] Sit to Stand: Mod assist;+2 physical assistance Stand pivot transfers: Max assist;+2 physical assistance       General transfer comment: increased time and effort, multimodal cueing required, face-to-face with  gait belt and bed pad for stand and pivot to recliner chair  Ambulation/Gait             General Gait Details: unable   Stairs             Wheelchair Mobility    Modified Rankin (Stroke Patients Only)       Balance Overall balance assessment: Needs assistance Sitting-balance support: Single extremity supported;Feet supported Sitting balance-Leahy Scale: Poor     Standing balance support: Single extremity supported Standing balance-Leahy Scale: Zero Standing balance comment: Max A to maintain standing balance                            Cognition Arousal/Alertness: Awake/alert Behavior During Therapy: WFL for tasks assessed/performed Overall Cognitive Status: History of cognitive impairments - at baseline Area of Impairment: Memory;Attention;Following commands;Safety/judgement;Awareness;Problem solving                   Current Attention Level: Sustained Memory: Decreased short-term memory;Decreased recall of precautions Following Commands: Follows one step commands inconsistently;Follows one step commands with increased time Safety/Judgement: Decreased awareness of deficits;Decreased awareness of safety Awareness: Intellectual Problem Solving: Slow processing;Decreased initiation;Difficulty sequencing;Requires verbal cues;Requires tactile cues        Exercises      General Comments        Pertinent Vitals/Pain Pain Assessment: Faces Faces Pain Scale: Hurts little more Pain Location: L shoulder with movement Pain Descriptors / Indicators: Guarding Pain Intervention(s): Monitored during session;Repositioned    Home Living  Prior Function            PT Goals (current goals can now be found in the care plan section) Acute Rehab PT Goals PT Goal Formulation: With patient Time For Goal Achievement: 05/21/20 Potential to Achieve Goals: Fair Progress towards PT goals: Progressing toward goals     Frequency    Min 2X/week      PT Plan Current plan remains appropriate;Equipment recommendations need to be updated    Co-evaluation              AM-PAC PT "6 Clicks" Mobility   Outcome Measure  Help needed turning from your back to your side while in a flat bed without using bedrails?: A Little Help needed moving from lying on your back to sitting on the side of a flat bed without using bedrails?: A Lot Help needed moving to and from a bed to a chair (including a wheelchair)?: A Lot Help needed standing up from a chair using your arms (e.g., wheelchair or bedside chair)?: A Lot Help needed to walk in hospital room?: Total Help needed climbing 3-5 steps with a railing? : Total 6 Click Score: 11    End of Session Equipment Utilized During Treatment: Gait belt;Other (comment) (L UE sling) Activity Tolerance: Patient tolerated treatment well Patient left: in chair;with call bell/phone within reach;with chair alarm set Nurse Communication: Mobility status PT Visit Diagnosis: Unsteadiness on feet (R26.81);Other abnormalities of gait and mobility (R26.89);History of falling (Z91.81);Muscle weakness (generalized) (M62.81)     Time: 1145-1200 PT Time Calculation (min) (ACUTE ONLY): 15 min  Charges:  $Therapeutic Activity: 8-22 mins                     Arletta Bale, DPT  Acute Rehabilitation Services Pager [phone] Office [phone]     Alessandra Bevels Wynnie Pacetti 05/11/2020, 1:11 PM

## 2020-05-12 LAB — CBC WITH DIFFERENTIAL/PLATELET
Abs Immature Granulocytes: 0.34 10*3/uL — ABNORMAL HIGH (ref 0.00–0.07)
Basophils Absolute: 0.1 10*3/uL (ref 0.0–0.1)
Basophils Relative: 1 %
Eosinophils Absolute: 0.3 10*3/uL (ref 0.0–0.5)
Eosinophils Relative: 3 %
HCT: 34.8 % — ABNORMAL LOW (ref 39.0–52.0)
Hemoglobin: 12 g/dL — ABNORMAL LOW (ref 13.0–17.0)
Immature Granulocytes: 3 %
Lymphocytes Relative: 10 %
Lymphs Abs: 1 10*3/uL (ref 0.7–4.0)
MCH: 29.7 pg (ref 26.0–34.0)
MCHC: 34.5 g/dL (ref 30.0–36.0)
MCV: 86.1 fL (ref 80.0–100.0)
Monocytes Absolute: 1.3 10*3/uL — ABNORMAL HIGH (ref 0.1–1.0)
Monocytes Relative: 13 %
Neutro Abs: 7 10*3/uL (ref 1.7–7.7)
Neutrophils Relative %: 70 %
Platelets: 239 10*3/uL (ref 150–400)
RBC: 4.04 MIL/uL — ABNORMAL LOW (ref 4.22–5.81)
RDW: 13.3 % (ref 11.5–15.5)
WBC: 10 10*3/uL (ref 4.0–10.5)
nRBC: 0 % (ref 0.0–0.2)

## 2020-05-12 LAB — SARS CORONAVIRUS 2 BY RT PCR (HOSPITAL ORDER, PERFORMED IN ~~LOC~~ HOSPITAL LAB): SARS Coronavirus 2: NEGATIVE

## 2020-05-12 NOTE — TOC Transition Note (Addendum)
Transition of Care [facility]) - CM/SW Discharge Note *Discharged to [facility] *Room Number: 3218 *Number for Report: [phone]   Patient Details  Name: Alan [name] MRN: [id number] Date of Birth: 1946-06-17  Transition of Care [facility]) CM/SW Contact:  Cristobal Goldmann, LCSW Phone Number: 05/12/2020, 3:22 PM   Clinical Narrative:  Patient medically stable for discharge and will go to [facility] for ST rehab. Insurance Auth receivedArlys John Ref [id number]; [facility] billing number has not been generated; Approval effective 7/23 for 5 days and 7/27 is next review date. Fax number for continued stay clinicals [phone]. Patient will be transported to facility by non-emergency ambulance and family completing admissions paperwork at [facility]. Nurse will be provided with information to call report.   3:27 pm: Call received from Ms. Nikki Dom regarding how patient will be transported to [facility]. Ms. Ishmael expressed thanks to CSW for assistance with SNF placement.       Final next level of care: Skilled Nursing Facility [facility]) Barriers to Discharge: Barriers Resolved   Patient Goals and CMS Choice Patient states their goals for this hospitalization and ongoing recovery are:: Patient and family agreeable to ST rehab CMS Medicare.gov Compare Post Acute Care list provided to:: Patient Represenative (must comment) (Talked with patient's family by home) Choice offered to / list presented to : Patient (Also talked with patient's family - [place])  Discharge Placement   Existing PASRR number confirmed : 05/11/20          Patient chooses bed at: [facility] Patient to be transferred to facility by: [facility] Name of family member notified: [place] - sister-in-law [phone]) Patient and family notified of of transfer: 05/12/20  Discharge Plan and Services                                      Social Determinants of Health (SDOH) Interventions  No SDOH interventions requested or needed at discharge   Readmission Risk Interventions No flowsheet data found.

## 2020-05-12 NOTE — Progress Notes (Signed)
PROGRESS NOTE    Alan [name]  RCV:[id number] DOB: 1946-07-23 DOA: 05/06/2020 PCP: Barbie Banner, MD   Brief Narrative: 74 year old with past medical history significant for hypertension, PTSD, schizophrenia presented with altered mental status and fall.  Patient was very confused in the ED most of the history was provided by patient niece who was at bedside.  Patient [place], niece visits him 1 or twice a week.  Patient was found on the floor by his niece.  He was awake and confused. Patient  Report that he fell down on Monday.  At baseline patient is able to ambulate with the assistance of a walker.  Patient appears more confused than his baseline.  Patient reported generalized weakness before he fell down. Evaluation in the ED UA consistent with infection, positive nitrates, white blood cells 6-10.  White blood cell 18.  CT showed cholesteatoma.   Assessment & Plan:   Active Problems:   Urinary tract infection without hematuria   Rhabdomyolysis   Pressure injury of skin   Humerus fracture   Acute metabolic encephalopathy   Hypomagnesemia  1-Rhabdomyolysis;  Nontraumatic, due to prolonged immobilization. Follow CK level trend. 2126>1150> 272 Resolved.    2-Acute metabolic encephalopathy Patient was alert and fully oriented today. Continue to correct electrolyte abnormalities. Continue with treatment of urinary tract infection Resolved.  3-UTI, Pseudomonas, urinary retention: Patient presents with leukocytosis.  UA with positive nitrates, white blood cell 6-10. Urine culture growing Pseudomonas. Continue with cefepime day 5.  Blood cultures -No growth today.  Hypomagnesemia: Resolved  Hypokalemia, resolved.  Hypophosphatemia; resolved.  Heart murmur; no significant valvular disease 2D ECHO.   Left arm weakness, left LE weakness;  -Patient with lot of bruise the left arm and unable to move it.  X ray ordered showed proximal humerus fracture.   -Discuss care with neurologist, in regards lower extremity weakness will need to first correct electrolyte derangement, if weakness persist will need formal neurology evaluation. Weakness improved.  -MRI: Negative for acute stroke. Loss of the normal flow voids of the intracranial internal carotid arteries, which may reflect slow flow or chronic occlusion. 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 These findings are likely chronic.There is segmental mild fusiform dilatation of the right posterior communicating artery possibly reflecting post-stenotic dilatation or flow related aneurysmal dilatation. -Discussed CTA finding with neurology, recommend out patient vascular follow up outpatient.   Hyponatremia; stable.  Elevated TSH;  Free T 4; 1.18  Needs repeat Thyroid function in 4 weeks.   Moderate protein caloric malnutrition; start Ensure  Cholesteatoma of right ear: Case discussed with on-call ENT Dr. Jenne Pane, recommend outpatient follow-up  Hypertension; controlled.  Continue with Norvasc, Coreg  Schizophrenia; continue with Risperdal   Pressure Injury 05/06/20 Sacrum Mid Stage 2 -  Partial thickness loss of dermis presenting as a shallow open injury with a red, pink wound bed without slough. Partial loss of dermis,shallow red wound bed with scant serous drainage. (Active)  05/06/20 1600  Location: Sacrum  Location Orientation: Mid  Staging: Stage 2 -  Partial thickness loss of dermis presenting as a shallow open injury with a red, pink wound bed without slough.  Wound Description (  Comments): Partial loss of dermis,shallow red wound bed with  scant serous drainage.  Present on Admission: Yes     Nutrition Problem: Severe Malnutrition Etiology: chronic illness (CVA, esophageal stricture)    Signs/Symptoms: moderate fat depletion, severe fat depletion, moderate muscle depletion, severe muscle depletion    Interventions: Ensure Enlive (each supplement provides 350kcal and 20 grams of protein), MVI  Estimated body mass index is 21.28 kg/m as calculated from the following:   Height as of 02/16/12: 5\' 5"  (1.651 m).   Weight as of this encounter: 58 kg.   DVT prophylaxis: Lovenox Code Status: Full code Family Communication: No family at bedside Disposition Plan:  Status is: Inpatient  Remains inpatient appropriate because:Hemodynamically unstable and Persistent severe electrolyte disturbances   Dispo: The patient is from: Home              Anticipated d/c is to: SNF              Anticipated d/c date is: Unknown.  Waiting for bed placement.              Patient currently is medically stable to d/c since 05/09/2020.  Waiting for placement.   Consultants:     Procedures:   Echo:  Antimicrobials:  Cefepime  Subjective: Seen and examined.  He has no complaints.  Objective: Vitals:   05/11/20 1610 05/11/20 2050 05/12/20 0514 05/12/20 0914  BP: (!) 132/80 (!) 153/75 (!) 136/68 (!) 133/51  Pulse: 76 78 72 70  Resp: 16 16 16 16   Temp: 98.4 F (36.9 C) 98.7 F (37.1 C) 99.6 F (37.6 C) 98.7 F (37.1 C)  TempSrc: Oral Oral Oral   SpO2: 97% 96% 97% 96%  Weight:  58 kg      Intake/Output Summary (Last 24 hours) at 05/12/2020 1224 Last data filed at 05/12/2020 0852 Gross per 24 hour  Intake 820.07 ml  Output 1775 ml  Net -954.93 ml   Filed Weights   05/06/20 1931 05/07/20 2048 05/11/20 2050  Weight: 55 kg 58 kg 58 kg    Examination:  General exam: Appears calm and comfortable  Respiratory system: Clear to auscultation. Respiratory effort normal. Cardiovascular system: S1 & S2 heard, RRR. No JVD,  murmurs, rubs, gallops or clicks. No pedal edema. Gastrointestinal system: Abdomen is nondistended, soft and nontender. No organomegaly or masses felt. Normal bowel sounds heard. Central nervous system: Alert and oriented. No focal neurological deficits. Extremities: Sling in left upper extremity. Skin: No rashes, lesions or ulcers.  Psychiatry: Judgement and insight appear normal. Mood & affect appropriate.      Data Reviewed: I have personally reviewed following labs and imaging studies  CBC: Recent Labs  Lab 05/06/20 1020 05/07/20 0254 05/08/20 0558 05/09/20 0743 05/12/20 0658  WBC 18.1* 12.9* 10.1 10.6* 10.0  NEUTROABS 16.5*  --   --   --  7.0  HGB 13.8 11.8* 10.8* 11.9* 12.0*  HCT 39.5 34.2* 31.9* 35.2* 34.8*  MCV 85.1 85.9 87.4 87.6 86.1  PLT 229 178 179 229 239   Basic Metabolic Panel: Recent Labs  Lab 05/06/20 1022 05/07/20 0254 05/08/20 0558 05/09/20 0743 05/10/20 0447 05/11/20 0409  NA 128* 132* 132* 133* 131*  --   K 3.2* 2.7* 3.2* 3.8 3.7  --   CL 90* 98 99 100 99  --   CO2 23 21* 25 24 24   --   GLUCOSE 129* 95 94 129* 102*  --   BUN 22 19 14 11  7*  --  CREATININE 0.63 0.61 0.53* 0.43* 0.46*  --   CALCIUM 8.8* 7.8* 7.5* 7.8* 7.8*  --   MG  --  1.6* 2.0 1.8 1.6* 2.0  PHOS  --   --  1.4* 2.0* 2.8  --    GFR: CrCl cannot be calculated (Unknown ideal weight.). Liver Function Tests: Recent Labs  Lab 05/06/20 1022  AST 59*  ALT 31  ALKPHOS 65  BILITOT 1.5*  PROT 6.7  ALBUMIN 3.7   No results for input(s): LIPASE, AMYLASE in the last 168 hours. Recent Labs  Lab 05/06/20 1021  AMMONIA 20   Coagulation Profile: Recent Labs  Lab 05/06/20 1022  INR 1.1   Cardiac Enzymes: Recent Labs  Lab 05/06/20 1022 05/07/20 0254 05/10/20 0447  CKTOTAL 2,126* 1,150* 272   BNP (last 3 results) No results for input(s): PROBNP in the last 8760 hours. HbA1C: No results for input(s): HGBA1C in the last 72 hours. CBG: No results for input(s): GLUCAP  in the last 168 hours. Lipid Profile: No results for input(s): CHOL, HDL, LDLCALC, TRIG, CHOLHDL, LDLDIRECT in the last 72 hours. Thyroid Function Tests: No results for input(s): TSH, T4TOTAL, FREET4, T3FREE, THYROIDAB in the last 72 hours. Anemia Panel: No results for input(s): VITAMINB12, FOLATE, FERRITIN, TIBC, IRON, RETICCTPCT in the last 72 hours. Sepsis Labs: Recent Labs  Lab 05/06/20 1022  LATICACIDVEN 2.3*    Recent Results (from the past 240 hour(s))  SARS Coronavirus 2 by RT PCR (hospital order, performed in [facility] hospital lab) Nasopharyngeal Nasopharyngeal Swab     Status: None   Collection Time: 05/06/20 10:24 AM   Specimen: Nasopharyngeal Swab  Result Value Ref Range Status   SARS Coronavirus 2 NEGATIVE NEGATIVE Final    Comment: (NOTE) SARS-CoV-2 target nucleic acids are NOT DETECTED.  The SARS-CoV-2 RNA is generally detectable in upper and lower respiratory specimens during the acute phase of infection. The lowest concentration of SARS-CoV-2 viral copies this assay can detect is 250 copies / mL. A negative result does not preclude SARS-CoV-2 infection and should not be used as the sole basis for treatment or other patient management decisions.  A negative result may occur with improper specimen collection / handling, submission of specimen other than nasopharyngeal swab, presence of viral mutation(s) within the areas targeted by this assay, and inadequate number of viral copies (<250 copies / mL). A negative result must be combined with clinical observations, patient history, and epidemiological information.  Fact Sheet for Patients:   BoilerBrush.com.cy  Fact Sheet for Healthcare Providers: https://pope.com/  This test is not yet approved or  cleared by the [place] FDA and has been authorized for detection and/or diagnosis of SARS-CoV-2 by FDA under an Emergency Use Authorization (EUA).  This EUA  will remain in effect (meaning this test can be used) for the duration of the COVID-19 declaration under Section 564(b)(1) of the Act, 21 U.S.C. section 360bbb-3(b)(1), unless the authorization is terminated or revoked sooner.  Performed at [facility], [address], [place], [place] [address]   Blood Cultures (routine x 2)     Status: None   Collection Time: 05/06/20 10:26 AM   Specimen: BLOOD  Result Value Ref Range Status   Specimen Description BLOOD SITE NOT SPECIFIED  Final   Special Requests   Final    BOTTLES DRAWN AEROBIC AND ANAEROBIC Blood Culture adequate volume   Culture   Final    NO GROWTH 5 DAYS Performed at [facility], [address] [place], [place] [address]    Report Status 05/11/2020 FINAL  Final  Blood Cultures (routine x 2)     Status: None   Collection Time: 05/06/20 10:28 AM   Specimen: BLOOD  Result Value Ref Range Status   Specimen Description BLOOD SITE NOT SPECIFIED  Final   Special Requests   Final    BOTTLES DRAWN AEROBIC AND ANAEROBIC Blood Culture adequate volume   Culture   Final    NO GROWTH 5 DAYS Performed at [facility], [address] [place], [place] [address]    Report Status 05/11/2020 FINAL  Final  Urine culture     Status: Abnormal   Collection Time: 05/06/20 11:14 AM   Specimen: Urine, Random  Result Value Ref Range Status   Specimen Description URINE, RANDOM  Final   Special Requests   Final    NONE Performed at [facility], [address] [place], [place] [address]    Culture >=100,000 COLONIES/mL PSEUDOMONAS AERUGINOSA (A)  Final   Report Status 05/08/2020 FINAL  Final   Organism ID, Bacteria PSEUDOMONAS AERUGINOSA (A)  Final      Susceptibility   Pseudomonas aeruginosa - MIC*    CEFTAZIDIME 4 SENSITIVE Sensitive     CIPROFLOXACIN <=0.25 SENSITIVE Sensitive     GENTAMICIN <=1 SENSITIVE Sensitive     IMIPENEM 8 INTERMEDIATE Intermediate     PIP/TAZO <=4 SENSITIVE Sensitive     CEFEPIME  2 SENSITIVE Sensitive     * >=100,000 COLONIES/mL PSEUDOMONAS AERUGINOSA         Radiology Studies: No results found.      Scheduled Meds: . amLODipine  5 mg Oral q morning - 10a  . aspirin EC  325 mg Oral Daily  . carvedilol  12.5 mg Oral BID WC  . Chlorhexidine Gluconate Cloth  6 each Topical Daily  . enoxaparin (LOVENOX) injection  40 mg Subcutaneous Q24H  . feeding supplement (ENSURE ENLIVE)  237 mL Oral TID BM  . multivitamin with minerals  1 tablet Oral Daily  . risperiDONE  1 mg Oral Daily  . vitamin E  400 Units Oral q morning - 10a   Continuous Infusions: . ceFEPime (MAXIPIME) IV 2 g (05/12/20 0515)     LOS: 6 days    Time spent: 27 minutes.     Hughie Clossavi Doshia Dalia, MD [facility]   If 7PM-7AM, please contact night-coverage [email or web address]  05/12/2020, 12:24 PM

## 2020-05-12 NOTE — Discharge Summary (Signed)
Physician Discharge Summary  Alan [name] ZOX:[id number] DOB: [birth year] DOA: 05/06/2020  PCP: Barbie Banner, MD  Admit date: 05/06/2020 Discharge date: 05/12/2020  Admitted From: Home  Disposition:  SNF  Recommendations for Outpatient Follow-up:  1. Follow up with PCP in 1-2 weeks 2. Please obtain BMP/CBC in one week 3. Follow up with Dr Victorino Dike in 2 weeks for humerus fracture care, needs repeat x ray.  4. Repeat phosphorus level.  5. Follow up with Vascular for ICA stenosis.  6. Follow up with Dr Jenne Pane for further care of Cholesteatoma.  7. Needs thyroid function test in 4 weeks.    Discharge Condition: Stable.  CODE STATUS: Full code Diet recommendation: Regular diet.   Brief/Interim Summary: 74 year old with past medical history significant for hypertension, PTSD, schizophrenia presented with altered mental status and fall.  Patient was very confused in the ED most of the history was provided by patient niece who was at bedside.  Patient [place] himself [place], niece visits him 1 or twice a week.  Patient was found on the floor by his niece.  He was awake and confused. Patient  Report that he fell down on Monday.  At baseline patient is able to ambulate with the assistance of a walker.  Patient appears more confused than his baseline.  Patient reported generalized weakness before he fell down. Evaluation in the ED UA consistent with infection, positive nitrates, white blood cells 6-10.  White blood cell 18. CT showed cholesteatoma.    1-Rhabdomyolysis;  Nontraumatic, due to prolonged immobilization. Treated with IV fluids.  Now resolved.  2-Acute metabolic encephalopathy Resolved.  3-UTI, Pseudomonas, urinary retention: Patient presents with leukocytosis.  UA with positive nitrates, white blood cell 6-10. Urine culture growing Pseudomonas.  Patient received 5 days of IV cefepime.  Blood culture remained negative.  Hypomagnesemia: Resolved Hypokalemia,  resolved Hypophosphatemia; replete orally.   Heart murmur; no significant valvular disease 2D ECHO.   Left Proximal Humerus fracture, comminuted, mildly impacted.  Evaluated by Dr Victorino Dike, who recommended non operative management. Sling for immobilization..  Needs to follow up with Dr Victorino Dike in 2 weeks.   Left arm weakness, left LE weakness;  -Patient with lot of bruise the left arm and unable to move it.  X ray ordered showed proximal humerus fracture.  -Discuss care with neurologist, in regards lower extremity weakness will need to first correct electrolyte derangement, if weakness persist will need formal neurology evaluation. Weakness improved.  -MRI: Negative for acute stroke. Loss of the normal flow voids of the intracranial internal carotid arteries, which may reflect slow flow or chronic occlusion. 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 These findings are likely chronic.There is segmental mild fusiform dilatation of the right posterior communicating artery possibly reflecting post-stenotic dilatation or flow related aneurysmal dilatation. -Discussed CTA finding with neurology, recommend out patient vascular follow up outpatient.   Hyponatremia; stable at 131.  Elevated TSH;  Free T 4; 1.18  Needs repeat Thyroid function in 4 weeks.   Severe  protein caloric malnutrition; started  Ensure.  Cholesteatoma of right ear: Case discussed with on-call ENT Dr. Jenne Pane, recommend outpatient follow-up  Hypertension; continue with Norvasc, Coreg  Schizophrenia; continue  with Risperdal  Stage II sacrum pressure injury, present on  admission . Continue with local care.   Pressure Ulcer 02/16/12 Stage I -  Intact skin with non-blanchable redness of a localized area usually over a bony prominence. Red (Active)  02/16/12 2050  Location: Sacrum  Location Orientation:   Staging: Stage I -  Intact skin with non-blanchable redness of a localized area usually over a bony prominence.  Wound Description (Comments): Red  Present on Admission: Yes     Pressure Injury 05/06/20 Sacrum Mid Stage 2 -  Partial thickness loss of dermis presenting as a shallow open injury with a red, pink wound bed without slough. Partial loss of dermis,shallow red wound bed with scant serous drainage. (Active)  05/06/20 1600  Location: Sacrum  Location Orientation: Mid  Staging: Stage 2 -  Partial thickness loss of dermis presenting as a shallow open injury with a red, pink wound bed without slough.  Wound Description (Comments): Partial loss of dermis,shallow red wound bed with scant serous drainage.  Present on Admission: Yes     Discharge Diagnoses:  Active Problems:   Urinary tract infection without hematuria   Rhabdomyolysis   Pressure injury of skin   Humerus fracture   Acute metabolic encephalopathy   Hypomagnesemia    Discharge Instructions  Discharge Instructions    Non weight bearing   Complete by: As directed    Laterality: left   Extremity: Upper     Allergies as of 05/12/2020   No Known Allergies     Medication List    STOP taking these medications   benztropine 0.5 MG tablet Commonly known as: COGENTIN   hydrochlorothiazide 12.5 MG capsule Commonly known as: MICROZIDE     TAKE these medications   amLODipine 5 MG tablet Commonly known as: NORVASC Take 5 mg by mouth every morning.   aspirin EC 325 MG tablet Take 325 mg by mouth daily.   carvedilol 12.5 MG tablet Commonly known as: COREG Take 1 tablet (12.5 mg total) by mouth 2 (two) times daily with a meal. What changed:   medication  strength  how much to take   multivitamin with minerals Tabs tablet Take 1 tablet by mouth daily.   phosphorus 155-852-130 MG tablet Commonly known as: K PHOS NEUTRAL Take 2 tablets (500 mg total) by mouth 3 (three) times daily.   risperiDONE 1 MG tablet Commonly known as: RISPERDAL Take 1 mg by mouth daily.   tiotropium 18 MCG inhalation capsule Commonly known as: SPIRIVA Place 1 capsule (18 mcg total) into inhaler and inhale daily.   vitamin E 180 MG (400 UNITS) capsule Take 400 Units by mouth every morning.            Discharge Care Instructions  (From admission, onward)         Start     Ordered   05/08/20 0000  Non weight bearing       Question Answer Comment  Laterality left   Extremity Upper      05/08/20 1057          Follow-up Information    Toni Arthurs, MD Follow up in 2 week(s).   Specialty: Orthopedic Surgery Contact information: [address] [place] [address] [phone]              No Known Allergies  Consultations:  Dr Victorino Dike  Neurology, phone consultation.    Procedures/Studies: CT ANGIO HEAD W OR WO CONTRAST  Result Date: 05/07/2020 CLINICAL  DATA:  Abnormal MRI EXAM: CT ANGIOGRAPHY HEAD AND NECK TECHNIQUE: Multidetector CT imaging of the head and neck was performed using the standard protocol during bolus administration of intravenous contrast. Multiplanar CT image reconstructions and MIPs were obtained to evaluate the vascular anatomy. Carotid stenosis measurements (when applicable) are obtained utilizing NASCET criteria, using the distal internal carotid diameter as the denominator. CONTRAST:  75mL OMNIPAQUE IOHEXOL 350 MG/ML SOLN COMPARISON:  Correlation made with recent prior imaging FINDINGS: CTA NECK Aortic arch: Calcified and irregular noncalcified plaque present along the arch and at the great vessel origins. Moderate stenosis of the proximal left subclavian artery. Right carotid system: Common  carotid is patent with mild calcified plaque. There is calcified and noncalcified plaque at the carotid bifurcation extending along the proximal internal carotid. 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 Left carotid system: Marked stenosis at the origin with partial reconstitution and subsequent additional plaque with occlusion. There is reconstitution of the external carotid. Cervical internal carotid is occluded throughout the neck. Vertebral arteries: Patent. Calcified plaque at the left vertebral origin causes less than 50% stenosis. There is additional mild calcified plaque bilaterally. Both arteries appear prominent likely due to carotid occlusions. Skeleton: Mild degenerative changes the cervical spine. Other neck: Subcentimeter right thyroid nodule for which no further follow-up is recommended by current guidelines. No mass or adenopathy. Upper chest: No apical lung mass. Review of the MIP images confirms the above findings CTA HEAD Anterior circulation: Reconstitution of the intracranial internal carotid arteries near the anterior clinoid processes. Anterior cerebral arteries are patent. Left A1 ACA is dominant. Anterior communicating artery is present. Middle cerebral arteries are patent. Posterior circulation: Intracranial vertebral arteries are patent with mild calcified plaque. Basilar artery is patent. Posterior cerebral arteries are patent. Bilateral posterior communicating arteries are present and likely contribute to reconstitution of internal carotids. There is segmental mild fusiform dilatation of the right posterior communicating artery (series 14, image 20). Venous sinuses: Patent as allowed by contrast bolus timing. 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 Occluded left CCA and left cervical ICA with some  reconstitution of the supraclinoid portion intracranially. These findings are likely chronic. Patent posterior circulation with large vertebral arteries and patent posterior communicating arteries. There is segmental mild fusiform dilatation of the right posterior communicating artery possibly reflecting post-stenotic dilatation or flow related aneurysmal dilatation. Electronically Signed   By: Guadlupe Spanish M.D.   On: 05/07/2020 13:46   DG Forearm Left  Result Date: 05/07/2020 CLINICAL DATA:  Pt has AMS and is complaining of pain in left arm. He has significant amounts of bruising at the elbow around his IV. EXAM: LEFT FOREARM - 2 VIEW COMPARISON:  None. FINDINGS: There is no evidence of fracture or other focal bone lesions. Soft tissues are unremarkable. IMPRESSION: No acute osseous abnormality in the left forearm. Electronically Signed   By: Emmaline Kluver M.D.   On: 05/07/2020 16:18   CT HEAD WO CONTRAST  Result Date: 05/06/2020 CLINICAL DATA:  Ataxia.  Head trauma. EXAM: CT HEAD WITHOUT CONTRAST TECHNIQUE: Contiguous axial images were obtained from the base of the skull through the vertex without intravenous contrast. COMPARISON:  None. FINDINGS: Brain: Mild cerebral atrophy. Encephalomalacia involving the medial right frontal lobe. Evidence for encephalomalacia in the posterior left parietal lobe. Scattered areas of periventricular low density. Negative for acute hemorrhage, mass  lesion, midline shift, hydrocephalus or large new infarct. Vascular: No hyperdense vessel or unexpected calcification. Skull: Normal. Negative for fracture or focal lesion. Sinuses/Orbits: Right mastoid sinus is opacified and there is a soft tissue lesion in the mastoid antrum obstructing the mastoid sinus and extending to the right middle ear ossicles. Findings are suggestive for a cholesteatoma. Visualized paranasal sinuses are unremarkable. Other: None. IMPRESSION: 1. Soft tissue lesion in the right mastoid antrum that  is obstructing the right mastoid sinus and extending to the right middle ear ossicles. 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 2. Scattered areas of encephalomalacia and white matter disease in the brain. Findings are likely related to previous insults or infarcts. No acute intracranial abnormality. These results were called by telephone at the time of interpretation on 05/06/2020 at 12:08 pm to provider ABIGAIL HARRIS , who verbally acknowledged these results. Electronically Signed   By: Richarda Overlie M.D.   On: 05/06/2020 12:09   CT ANGIO NECK W OR WO CONTRAST  Result Date: 05/07/2020 CLINICAL DATA:  Abnormal MRI EXAM: CT ANGIOGRAPHY HEAD AND NECK TECHNIQUE: Multidetector CT imaging of the head and neck was performed using the standard protocol during bolus administration of intravenous contrast. Multiplanar CT image reconstructions and MIPs were obtained to evaluate the vascular anatomy. Carotid stenosis measurements (when applicable) are obtained utilizing NASCET criteria, using the distal internal carotid diameter as the denominator. CONTRAST:  75mL OMNIPAQUE IOHEXOL 350 MG/ML SOLN COMPARISON:  Correlation made with recent prior imaging FINDINGS: CTA NECK Aortic arch: Calcified and irregular noncalcified plaque present along the arch and at the great vessel origins. Moderate stenosis of the proximal left subclavian artery. Right carotid system: Common carotid is patent with mild calcified plaque. There is calcified and noncalcified plaque at the carotid bifurcation extending along the proximal internal carotid. 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 Left carotid system: Marked stenosis at the origin with partial reconstitution and subsequent additional plaque with occlusion. There is reconstitution of the external carotid. Cervical internal carotid is occluded throughout the neck. Vertebral arteries: Patent. Calcified plaque at the left vertebral origin  causes less than 50% stenosis. There is additional mild calcified plaque bilaterally. Both arteries appear prominent likely due to carotid occlusions. Skeleton: Mild degenerative changes the cervical spine. Other neck: Subcentimeter right thyroid nodule for which no further follow-up is recommended by current guidelines. No mass or adenopathy. Upper chest: No apical lung mass. Review of the MIP images confirms the above findings CTA HEAD Anterior circulation: Reconstitution of the intracranial internal carotid arteries near the anterior clinoid processes. Anterior cerebral arteries are patent. Left A1 ACA is dominant. Anterior communicating artery is present. Middle cerebral arteries are patent. Posterior circulation: Intracranial vertebral arteries are patent with mild calcified plaque. Basilar artery is patent. Posterior cerebral arteries are patent. Bilateral posterior communicating arteries are present and likely contribute to reconstitution of internal carotids. There is segmental mild fusiform dilatation of the right posterior communicating artery (series 14, image 20). Venous sinuses: Patent as allowed by contrast bolus timing. 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 Occluded left CCA and left cervical ICA with some reconstitution of the supraclinoid portion intracranially. These findings are likely chronic. Patent posterior circulation with large vertebral arteries and patent posterior communicating arteries. There is segmental mild fusiform dilatation of the right posterior communicating artery possibly reflecting post-stenotic dilatation or  flow related aneurysmal dilatation. Electronically Signed   By: Guadlupe Spanish M.D.   On: 05/07/2020 13:46   CT Cervical Spine Wo Contrast  Result Date: 05/06/2020 CLINICAL DATA:  Fall. EXAM: CT CERVICAL SPINE WITHOUT  CONTRAST TECHNIQUE: Multidetector CT imaging of the cervical spine was performed without intravenous contrast. Multiplanar CT image reconstructions were also generated. COMPARISON:  Head CT 05/06/2020 FINDINGS: Alignment: Normal. Skull base and vertebrae: No acute fracture. No primary bone lesion or focal pathologic process. Mild degenerative facet disease. Soft tissues and spinal canal: No prevertebral fluid or swelling. No visible canal hematoma. Disc levels: No significant disc space narrowing. Mild posterior disc space narrowing at C3-C4. Upper chest: Negative Other: Carotid artery calcifications. 9 mm low-density nodule in the right thyroid lobe. Visualized right mastoid sinus is opacified. IMPRESSION: No acute abnormality to the cervical spine. Electronically Signed   By: Richarda Overlie M.D.   On: 05/06/2020 12:18   MR BRAIN WO CONTRAST  Result Date: 05/06/2020 CLINICAL DATA:  Altered mental status and fall EXAM: MRI HEAD WITHOUT CONTRAST TECHNIQUE: Multiplanar, multiecho pulse sequences of the brain and surrounding structures were obtained without intravenous contrast. COMPARISON:  None. FINDINGS: Brain: There is no acute infarction or intracranial hemorrhage. There are chronic infarcts of the parasagittal right frontoparietal lobes (ACA territory) and left parietal lobe (MCA territory). There are some chronic blood products associated with the infarct on the right. Chronic small vessel infarcts of the right caudate and left corona radiata. Additional patchy and confluent areas of T2 hyperintensity in the supratentorial white matter nonspecific but may reflect moderate chronic microvascular ischemic changes. Prominence of the ventricles and sulci reflects generalized parenchymal volume loss. Small focus of susceptibility in the left frontal lobe is compatible with chronic microhemorrhage or less likely mineralization. There is no intracranial mass, mass effect, or edema. There is no hydrocephalus or  extra-axial fluid collection. Vascular: Loss of the expected flow void of the intracranial internal carotid arteries to the level of the clinoid processes. Skull and upper cervical spine: Normal marrow signal is preserved. Sinuses/Orbits: Paranasal sinuses are aerated. Orbits are unremarkable. Other: Sella is unremarkable. Opacification of the right mastoid. There is some corresponding reduced diffusion. Ossicular erosion is evident on prior head CT. IMPRESSION: No acute infarction, hemorrhage, or mass. Loss of the normal flow voids of the intracranial internal carotid arteries, which may reflect slow flow or chronic occlusion. There appears to be reconstitution at the level of the clinoid processes. Multiple chronic infarcts.  Chronic microvascular ischemic changes. Abnormal appearance of the right mastoid and middle ear. Correlating with prior CT, this is most consistent with a cholesteatoma. Electronically Signed   By: Guadlupe Spanish M.D.   On: 05/06/2020 19:31   CT SHOULDER LEFT WO CONTRAST  Result Date: 05/07/2020 CLINICAL DATA:  Fracture seen radiograph EXAM: CT OF THE UPPER LEFT EXTREMITY WITHOUT CONTRAST TECHNIQUE: Multidetector CT imaging of the upper left extremity was performed according to the standard protocol. COMPARISON:  Radiograph same day FINDINGS: Bones/Joint/Cartilage There is a comminuted mildly impacted fracture seen through the surgical neck of the proximal humerus and greater tuberosity. The humeral head still articulates with the glenoid. There is a small glenohumeral joint effusion present. Ligaments Suboptimally assessed by CT. Muscles and Tendons There is mild edema surrounding the lateral aspect of the humerus. The visualized portion of the tendons appear to be intact. Soft tissues Mild overlying soft tissue swelling is seen. There is a small left pleural effusion present. IMPRESSION: Comminuted mildly impacted fracture  involving the proximal humerus surgical neck and greater  tuberosity. Electronically Signed   By: Jonna Clark M.D.   On: 05/07/2020 20:31   DG Chest Port 1 View  Result Date: 05/06/2020 CLINICAL DATA:  Status post fall today.  Initial encounter. EXAM: PORTABLE CHEST 1 VIEW COMPARISON:  PA and lateral chest 10/12/2010. FINDINGS: The lungs are emphysematous but clear. Aortic atherosclerosis. Heart size normal. No pneumothorax or pleural effusion. No acute bony abnormality. IMPRESSION: No acute disease. Aortic Atherosclerosis (ICD10-I70.0) and Emphysema (ICD10-J43.9). Electronically Signed   By: Drusilla Kanner M.D.   On: 05/06/2020 11:11   DG Humerus Left  Result Date: 05/07/2020 CLINICAL DATA:  Pt has AMS and is complaining of pain in left arm. He has significant amounts of bruising at the elbow around his IV. EXAM: LEFT HUMERUS - 2+ VIEW COMPARISON:  None. FINDINGS: There is an impacted and mildly comminuted fracture of the proximal left humerus. Regional soft tissues are unremarkable. IMPRESSION: Impacted and mildly comminuted fracture of the proximal left humerus. Electronically Signed   By: Emmaline Kluver M.D.   On: 05/07/2020 16:17   ECHOCARDIOGRAM COMPLETE  Result Date: 05/07/2020    ECHOCARDIOGRAM REPORT   Patient Name:   Alan [name] Date of Exam: 05/07/2020 Medical Rec #:  [id number]   Height:       65.0 in Accession #:    [id number]  Weight:       121.3 lb Date of Birth:  08-Jun-1946   BSA:          1.599 m Patient Age:    74 years    BP:           135/64 mmHg Patient Gender: M           HR:           74 bpm. Exam Location:  Inpatient Procedure: 2D Echo Indications:    785.2 murmur  History:        Patient has no prior history of Echocardiogram examinations.                 Stroke; Risk Factors:Hypertension and Current Smoker.  Sonographer:    Celene Skeen RDCS (AE) Referring Phys: [id number] [facility]  Sonographer Comments: No subcostal window. restricted mobility - left shoulder injured IMPRESSIONS  1. Left ventricular ejection fraction, by  estimation, is 60 to 65%. The left ventricle has normal function. The left ventricle has no regional wall motion abnormalities. Left ventricular diastolic parameters are indeterminate.  2. Right ventricular systolic function is normal. The right ventricular size is normal.  3. The mitral valve is normal in structure. Trivial mitral valve regurgitation.  4. The aortic valve is tricuspid. Aortic valve regurgitation is not visualized. Mild aortic valve sclerosis is present, with no evidence of aortic valve stenosis. FINDINGS  Left Ventricle: Left ventricular ejection fraction, by estimation, is 60 to 65%. The left ventricle has normal function. The left ventricle has no regional wall motion abnormalities. The left ventricular internal cavity size was normal in size. There is  no left ventricular hypertrophy. Left ventricular diastolic parameters are indeterminate. Right Ventricle: The right ventricular size is normal. Right vetricular wall thickness was not assessed. Right ventricular systolic function is normal. Left Atrium: Left atrial size was normal in size. Right Atrium: Right atrial size was normal in size. Pericardium: Trivial pericardial effusion is present. Mitral Valve: The mitral valve is normal in structure. Trivial mitral valve regurgitation. Tricuspid Valve: The tricuspid valve is normal in  structure. Tricuspid valve regurgitation is mild. Aortic Valve: The aortic valve is tricuspid. Aortic valve regurgitation is not visualized. Mild aortic valve sclerosis is present, with no evidence of aortic valve stenosis. Pulmonic Valve: The pulmonic valve was grossly normal. Pulmonic valve regurgitation is not visualized. Aorta: The aortic root is normal in size and structure. Venous: The inferior vena cava was not well visualized. IAS/Shunts: No atrial level shunt detected by color flow Doppler.  LEFT VENTRICLE PLAX 2D LVIDd:         3.40 cm  Diastology LVIDs:         2.45 cm  LV e' lateral:   9.57 cm/s LV PW:          0.90 cm  LV E/e' lateral: 5.6 LV IVS:        1.00 cm  LV e' medial:    6.20 cm/s LVOT diam:     1.90 cm  LV E/e' medial:  8.6 LV SV:         57 LV SV Index:   35 LVOT Area:     2.84 cm  RIGHT VENTRICLE TAPSE (M-mode): 1.5 cm LEFT ATRIUM             Index       RIGHT ATRIUM           Index LA diam:        2.80 cm 1.75 cm/m  RA Area:     12.70 cm LA Vol (A2C):   26.8 ml 16.76 ml/m RA Volume:   22.30 ml  13.94 ml/m LA Vol (A4C):   23.6 ml 14.76 ml/m LA Biplane Vol: 25.7 ml 16.07 ml/m  AORTIC VALVE LVOT Vmax:   88.10 cm/s LVOT Vmean:  60.100 cm/s LVOT VTI:    0.200 m  AORTA Ao Root diam: 3.00 cm MITRAL VALVE MV Area (PHT): 2.32 cm    SHUNTS MV Decel Time: 327 msec    Systemic VTI:  0.20 m MV E velocity: 53.60 cm/s  Systemic Diam: 1.90 cm MV A velocity: 68.60 cm/s MV E/A ratio:  0.78 Dietrich Pates MD Electronically signed by Dietrich Pates MD Signature Date/Time: 05/07/2020/11:14:28 AM    Final    DG HIP UNILAT WITH PELVIS 2-3 VIEWS LEFT  Result Date: 05/06/2020 CLINICAL DATA:  Left hip pain. EXAM: DG HIP (WITH OR WITHOUT PELVIS) 2-3V LEFT COMPARISON:  Pelvis 10/12/2010 FINDINGS: Pelvic bony ring is intact. Left hip is located without a fracture. No gross abnormality to the right hip. Large amount of stool in the pelvis and right lower abdomen. Atherosclerotic calcifications. IMPRESSION: No acute bone abnormality in the pelvis or left hip. Large amount of stool in the abdomen and pelvis. Electronically Signed   By: Richarda Overlie M.D.   On: 05/06/2020 12:32   DG HIP UNILAT WITH PELVIS 2-3 VIEWS RIGHT  Result Date: 05/06/2020 CLINICAL DATA:  Fall. EXAM: DG HIP (WITH OR WITHOUT PELVIS) 2-3V RIGHT COMPARISON:  Left hip 05/06/2020 FINDINGS: Right hip is located without a fracture. Large amount of stool in the abdomen and pelvis. Visualized pelvic bony structures are intact. IMPRESSION: No acute bone abnormality to the right hip. Electronically Signed   By: Richarda Overlie M.D.   On: 05/06/2020 12:34      Subjective: Seen and examined.  Alert and oriented.  No complaints.  Discharge Exam: Vitals:   05/12/20 0514 05/12/20 0914  BP: (!) 136/68 (!) 133/51  Pulse: 72 70  Resp: 16 16  Temp: 99.6 F (37.6 C)  98.7 F (37.1 C)  SpO2: 97% 96%    General exam: Appears calm and comfortable  Respiratory system: Clear to auscultation. Respiratory effort normal. Cardiovascular system: S1 & S2 heard, RRR. No JVD, murmurs, rubs, gallops or clicks. No pedal edema. Gastrointestinal system: Abdomen is nondistended, soft and nontender. No organomegaly or masses felt. Normal bowel sounds heard. Central nervous system: Alert and oriented. No focal neurological deficits. Extremities: Sling in left upper extremity Skin: No rashes, lesions or ulcers.  Psychiatry: Judgement and insight appear normal. Mood & affect appropriate.      The results of significant diagnostics from this hospitalization (including imaging, microbiology, ancillary and laboratory) are listed below for reference.     Microbiology: Recent Results (from the past 240 hour(s))  SARS Coronavirus 2 by RT PCR (hospital order, performed in [facility] hospital lab) Nasopharyngeal Nasopharyngeal Swab     Status: None   Collection Time: 05/06/20 10:24 AM   Specimen: Nasopharyngeal Swab  Result Value Ref Range Status   SARS Coronavirus 2 NEGATIVE NEGATIVE Final    Comment: (NOTE) SARS-CoV-2 target nucleic acids are NOT DETECTED.  The SARS-CoV-2 RNA is generally detectable in upper and lower respiratory specimens during the acute phase of infection. The lowest concentration of SARS-CoV-2 viral copies this assay can detect is 250 copies / mL. A negative result does not preclude SARS-CoV-2 infection and should not be used as the sole basis for treatment or other patient management decisions.  A negative result may occur with improper specimen collection / handling, submission of specimen other than nasopharyngeal swab, presence  of viral mutation(s) within the areas targeted by this assay, and inadequate number of viral copies (<250 copies / mL). A negative result must be combined with clinical observations, patient history, and epidemiological information.  Fact Sheet for Patients:   BoilerBrush.com.cy  Fact Sheet for Healthcare Providers: https://pope.com/  This test is not yet approved or  cleared by the [place] FDA and has been authorized for detection and/or diagnosis of SARS-CoV-2 by FDA under an Emergency Use Authorization (EUA).  This EUA will remain in effect (meaning this test can be used) for the duration of the COVID-19 declaration under Section 564(b)(1) of the Act, 21 U.S.C. section 360bbb-3(b)(1), unless the authorization is terminated or revoked sooner.  Performed at [facility], [address] [place], [place] [address]   Blood Cultures (routine x 2)     Status: None   Collection Time: 05/06/20 10:26 AM   Specimen: BLOOD  Result Value Ref Range Status   Specimen Description BLOOD SITE NOT SPECIFIED  Final   Special Requests   Final    BOTTLES DRAWN AEROBIC AND ANAEROBIC Blood Culture adequate volume   Culture   Final    NO GROWTH 5 DAYS Performed at [facility], [address] [place], [place] [address]    Report Status 05/11/2020 FINAL  Final  Blood Cultures (routine x 2)     Status: None   Collection Time: 05/06/20 10:28 AM   Specimen: BLOOD  Result Value Ref Range Status   Specimen Description BLOOD SITE NOT SPECIFIED  Final   Special Requests   Final    BOTTLES DRAWN AEROBIC AND ANAEROBIC Blood Culture adequate volume   Culture   Final    NO GROWTH 5 DAYS Performed at [facility], [address] [place], [place] [address]    Report Status 05/11/2020 FINAL  Final  Urine culture     Status:  Abnormal   Collection Time: 05/06/20 11:14 AM   Specimen: Urine, Random  Result Value Ref Range Status    Specimen Description URINE, RANDOM  Final   Special Requests   Final    NONE Performed at [facility], [address], [place], [place] [address]    Culture >=100,000 COLONIES/mL PSEUDOMONAS AERUGINOSA (A)  Final   Report Status 05/08/2020 FINAL  Final   Organism ID, Bacteria PSEUDOMONAS AERUGINOSA (A)  Final      Susceptibility   Pseudomonas aeruginosa - MIC*    CEFTAZIDIME 4 SENSITIVE Sensitive     CIPROFLOXACIN <=0.25 SENSITIVE Sensitive     GENTAMICIN <=1 SENSITIVE Sensitive     IMIPENEM 8 INTERMEDIATE Intermediate     PIP/TAZO <=4 SENSITIVE Sensitive     CEFEPIME 2 SENSITIVE Sensitive     * >=100,000 COLONIES/mL PSEUDOMONAS AERUGINOSA     Labs: BNP (last 3 results) No results for input(s): BNP in the last 8760 hours. Basic Metabolic Panel: Recent Labs  Lab 05/06/20 1022 05/07/20 0254 05/08/20 0558 05/09/20 0743 05/10/20 0447 05/11/20 0409  NA 128* 132* 132* 133* 131*  --   K 3.2* 2.7* 3.2* 3.8 3.7  --   CL 90* 98 99 100 99  --   CO2 23 21* 25 24 24   --   GLUCOSE 129* 95 94 129* 102*  --   BUN 22 19 14 11  7*  --   CREATININE 0.63 0.61 0.53* 0.43* 0.46*  --   CALCIUM 8.8* 7.8* 7.5* 7.8* 7.8*  --   MG  --  1.6* 2.0 1.8 1.6* 2.0  PHOS  --   --  1.4* 2.0* 2.8  --    Liver Function Tests: Recent Labs  Lab 05/06/20 1022  AST 59*  ALT 31  ALKPHOS 65  BILITOT 1.5*  PROT 6.7  ALBUMIN 3.7   No results for input(s): LIPASE, AMYLASE in the last 168 hours. Recent Labs  Lab 05/06/20 1021  AMMONIA 20   CBC: Recent Labs  Lab 05/06/20 1020 05/07/20 0254 05/08/20 0558 05/09/20 0743 05/12/20 0658  WBC 18.1* 12.9* 10.1 10.6* 10.0  NEUTROABS 16.5*  --   --   --  7.0  HGB 13.8 11.8* 10.8* 11.9* 12.0*  HCT 39.5 34.2* 31.9* 35.2* 34.8*  MCV 85.1 85.9 87.4 87.6 86.1  PLT 229 178 179 229 239   Cardiac Enzymes: Recent Labs  Lab 05/06/20 1022 05/07/20 0254 05/10/20 0447  CKTOTAL 2,126* 1,150* 272   BNP: Invalid input(s): POCBNP CBG: No results for  input(s): GLUCAP in the last 168 hours. D-Dimer No results for input(s): DDIMER in the last 72 hours. Hgb A1c No results for input(s): HGBA1C in the last 72 hours. Lipid Profile No results for input(s): CHOL, HDL, LDLCALC, TRIG, CHOLHDL, LDLDIRECT in the last 72 hours. Thyroid function studies No results for input(s): TSH, T4TOTAL, T3FREE, THYROIDAB in the last 72 hours.  Invalid input(s): FREET3 Anemia work up No results for input(s): VITAMINB12, FOLATE, FERRITIN, TIBC, IRON, RETICCTPCT in the last 72 hours. Urinalysis    Component Value Date/Time   COLORURINE YELLOW 05/06/2020 1114   APPEARANCEUR HAZY (A) 05/06/2020 1114   LABSPEC 1.017 05/06/2020 1114   PHURINE 7.0 05/06/2020 1114   GLUCOSEU 50 (A) 05/06/2020 1114   HGBUR MODERATE (A) 05/06/2020 1114   BILIRUBINUR NEGATIVE 05/06/2020 1114   KETONESUR 20 (A) 05/06/2020 1114   PROTEINUR 100 (A) 05/06/2020 1114   UROBILINOGEN 0.2 02/16/2012 1232   NITRITE POSITIVE (A) 05/06/2020  1114   LEUKOCYTESUR NEGATIVE 05/06/2020 1114   Sepsis Labs Invalid input(s): PROCALCITONIN,  WBC,  LACTICIDVEN Microbiology Recent Results (from the past 240 hour(s))  SARS Coronavirus 2 by RT PCR (hospital order, performed in [facility] hospital lab) Nasopharyngeal Nasopharyngeal Swab     Status: None   Collection Time: 05/06/20 10:24 AM   Specimen: Nasopharyngeal Swab  Result Value Ref Range Status   SARS Coronavirus 2 NEGATIVE NEGATIVE Final    Comment: (NOTE) SARS-CoV-2 target nucleic acids are NOT DETECTED.  The SARS-CoV-2 RNA is generally detectable in upper and lower respiratory specimens during the acute phase of infection. The lowest concentration of SARS-CoV-2 viral copies this assay can detect is 250 copies / mL. A negative result does not preclude SARS-CoV-2 infection and should not be used as the sole basis for treatment or other patient management decisions.  A negative result may occur with improper specimen collection /  handling, submission of specimen other than nasopharyngeal swab, presence of viral mutation(s) within the areas targeted by this assay, and inadequate number of viral copies (<250 copies / mL). A negative result must be combined with clinical observations, patient history, and epidemiological information.  Fact Sheet for Patients:   BoilerBrush.com.cyhttps://www.fda.gov/media/136312/download  Fact Sheet for Healthcare Providers: https://pope.com/https://www.fda.gov/media/136313/download  This test is not yet approved or  cleared by the [place] FDA and has been authorized for detection and/or diagnosis of SARS-CoV-2 by FDA under an Emergency Use Authorization (EUA).  This EUA will remain in effect (meaning this test can be used) for the duration of the COVID-19 declaration under Section 564(b)(1) of the Act, 21 U.S.C. section 360bbb-3(b)(1), unless the authorization is terminated or revoked sooner.  Performed at [facility], [address] [place], [place] [address]   Blood Cultures (routine x 2)     Status: None   Collection Time: 05/06/20 10:26 AM   Specimen: BLOOD  Result Value Ref Range Status   Specimen Description BLOOD SITE NOT SPECIFIED  Final   Special Requests   Final    BOTTLES DRAWN AEROBIC AND ANAEROBIC Blood Culture adequate volume   Culture   Final    NO GROWTH 5 DAYS Performed at [facility], [address] [place], [place] [address]    Report Status 05/11/2020 FINAL  Final  Blood Cultures (routine x 2)     Status: None   Collection Time: 05/06/20 10:28 AM   Specimen: BLOOD  Result Value Ref Range Status   Specimen Description BLOOD SITE NOT SPECIFIED  Final   Special Requests   Final    BOTTLES DRAWN AEROBIC AND ANAEROBIC Blood Culture adequate volume   Culture   Final    NO GROWTH 5 DAYS Performed at [facility], [address] [place], [place] [address]    Report Status 05/11/2020 FINAL  Final  Urine culture     Status: Abnormal   Collection Time: 05/06/20  11:14 AM   Specimen: Urine, Random  Result Value Ref Range Status   Specimen Description URINE, RANDOM  Final   Special Requests   Final    NONE Performed at [facility], [address] [place], [place] [address]    Culture >=100,000 COLONIES/mL PSEUDOMONAS AERUGINOSA (A)  Final   Report Status 05/08/2020 FINAL  Final   Organism ID, Bacteria PSEUDOMONAS AERUGINOSA (A)  Final      Susceptibility   Pseudomonas aeruginosa - MIC*    CEFTAZIDIME 4 SENSITIVE Sensitive  CIPROFLOXACIN <=0.25 SENSITIVE Sensitive     GENTAMICIN <=1 SENSITIVE Sensitive     IMIPENEM 8 INTERMEDIATE Intermediate     PIP/TAZO <=4 SENSITIVE Sensitive     CEFEPIME 2 SENSITIVE Sensitive     * >=100,000 COLONIES/mL PSEUDOMONAS AERUGINOSA     Time coordinating discharge: 33 minutes  SIGNED:   Hughie Closs, MD  [facility]

## 2020-05-12 NOTE — Progress Notes (Addendum)
Attempted to give report to [facility] with no answer, left a message to the nurse to call me for report.   1726: Attempted to call for report again, no answer. [facility] here to transport patient.   Margretta Sidle, RN

## 2021-09-01 ENCOUNTER — Other Ambulatory Visit: Payer: Self-pay

## 2021-09-01 ENCOUNTER — Inpatient Hospital Stay (HOSPITAL_COMMUNITY)
Admission: EM | Admit: 2021-09-01 | Discharge: 2021-09-20 | DRG: 186 | Disposition: E | Payer: Medicare Other | Attending: Internal Medicine | Admitting: Internal Medicine

## 2021-09-01 ENCOUNTER — Emergency Department (HOSPITAL_COMMUNITY): Payer: Medicare Other

## 2021-09-01 ENCOUNTER — Encounter (HOSPITAL_COMMUNITY): Payer: Self-pay

## 2021-09-01 DIAGNOSIS — E860 Dehydration: Secondary | ICD-10-CM | POA: Diagnosis present

## 2021-09-01 DIAGNOSIS — L89222 Pressure ulcer of left hip, stage 2: Secondary | ICD-10-CM | POA: Diagnosis present

## 2021-09-01 DIAGNOSIS — J90 Pleural effusion, not elsewhere classified: Secondary | ICD-10-CM | POA: Diagnosis not present

## 2021-09-01 DIAGNOSIS — E43 Unspecified severe protein-calorie malnutrition: Secondary | ICD-10-CM | POA: Diagnosis present

## 2021-09-01 DIAGNOSIS — Z66 Do not resuscitate: Secondary | ICD-10-CM | POA: Diagnosis present

## 2021-09-01 DIAGNOSIS — G9341 Metabolic encephalopathy: Secondary | ICD-10-CM | POA: Diagnosis present

## 2021-09-01 DIAGNOSIS — R531 Weakness: Secondary | ICD-10-CM

## 2021-09-01 DIAGNOSIS — F209 Schizophrenia, unspecified: Secondary | ICD-10-CM | POA: Diagnosis present

## 2021-09-01 DIAGNOSIS — L89152 Pressure ulcer of sacral region, stage 2: Secondary | ICD-10-CM | POA: Diagnosis present

## 2021-09-01 DIAGNOSIS — L8990 Pressure ulcer of unspecified site, unspecified stage: Secondary | ICD-10-CM | POA: Diagnosis present

## 2021-09-01 DIAGNOSIS — R64 Cachexia: Secondary | ICD-10-CM | POA: Diagnosis present

## 2021-09-01 DIAGNOSIS — L03317 Cellulitis of buttock: Secondary | ICD-10-CM | POA: Diagnosis present

## 2021-09-01 DIAGNOSIS — F4310 Post-traumatic stress disorder, unspecified: Secondary | ICD-10-CM | POA: Diagnosis present

## 2021-09-01 DIAGNOSIS — W19XXXA Unspecified fall, initial encounter: Secondary | ICD-10-CM

## 2021-09-01 DIAGNOSIS — Z515 Encounter for palliative care: Secondary | ICD-10-CM

## 2021-09-01 DIAGNOSIS — F17210 Nicotine dependence, cigarettes, uncomplicated: Secondary | ICD-10-CM | POA: Diagnosis present

## 2021-09-01 DIAGNOSIS — Z681 Body mass index (BMI) 19 or less, adult: Secondary | ICD-10-CM

## 2021-09-01 DIAGNOSIS — R0902 Hypoxemia: Secondary | ICD-10-CM | POA: Diagnosis present

## 2021-09-01 DIAGNOSIS — S32000A Wedge compression fracture of unspecified lumbar vertebra, initial encounter for closed fracture: Secondary | ICD-10-CM | POA: Diagnosis present

## 2021-09-01 DIAGNOSIS — R0602 Shortness of breath: Secondary | ICD-10-CM | POA: Diagnosis not present

## 2021-09-01 DIAGNOSIS — E0781 Sick-euthyroid syndrome: Secondary | ICD-10-CM | POA: Diagnosis present

## 2021-09-01 DIAGNOSIS — J9601 Acute respiratory failure with hypoxia: Secondary | ICD-10-CM | POA: Diagnosis present

## 2021-09-01 DIAGNOSIS — Y92009 Unspecified place in unspecified non-institutional (private) residence as the place of occurrence of the external cause: Secondary | ICD-10-CM

## 2021-09-01 DIAGNOSIS — M4856XA Collapsed vertebra, not elsewhere classified, lumbar region, initial encounter for fracture: Secondary | ICD-10-CM | POA: Diagnosis present

## 2021-09-01 DIAGNOSIS — K5641 Fecal impaction: Secondary | ICD-10-CM | POA: Diagnosis present

## 2021-09-01 DIAGNOSIS — Z85820 Personal history of malignant melanoma of skin: Secondary | ICD-10-CM

## 2021-09-01 DIAGNOSIS — F02A0 Dementia in other diseases classified elsewhere, mild, without behavioral disturbance, psychotic disturbance, mood disturbance, and anxiety: Secondary | ICD-10-CM | POA: Diagnosis present

## 2021-09-01 DIAGNOSIS — F01A0 Vascular dementia, mild, without behavioral disturbance, psychotic disturbance, mood disturbance, and anxiety: Secondary | ICD-10-CM | POA: Diagnosis present

## 2021-09-01 DIAGNOSIS — E876 Hypokalemia: Secondary | ICD-10-CM | POA: Diagnosis present

## 2021-09-01 DIAGNOSIS — M4854XA Collapsed vertebra, not elsewhere classified, thoracic region, initial encounter for fracture: Secondary | ICD-10-CM | POA: Diagnosis present

## 2021-09-01 DIAGNOSIS — R296 Repeated falls: Secondary | ICD-10-CM | POA: Diagnosis present

## 2021-09-01 DIAGNOSIS — R627 Adult failure to thrive: Secondary | ICD-10-CM | POA: Diagnosis present

## 2021-09-01 DIAGNOSIS — Z20822 Contact with and (suspected) exposure to covid-19: Secondary | ICD-10-CM | POA: Diagnosis present

## 2021-09-01 DIAGNOSIS — Z79899 Other long term (current) drug therapy: Secondary | ICD-10-CM

## 2021-09-01 DIAGNOSIS — R54 Age-related physical debility: Secondary | ICD-10-CM | POA: Diagnosis present

## 2021-09-01 DIAGNOSIS — K5900 Constipation, unspecified: Secondary | ICD-10-CM | POA: Diagnosis present

## 2021-09-01 DIAGNOSIS — I10 Essential (primary) hypertension: Secondary | ICD-10-CM | POA: Diagnosis present

## 2021-09-01 DIAGNOSIS — Z8673 Personal history of transient ischemic attack (TIA), and cerebral infarction without residual deficits: Secondary | ICD-10-CM

## 2021-09-01 DIAGNOSIS — E041 Nontoxic single thyroid nodule: Secondary | ICD-10-CM | POA: Diagnosis present

## 2021-09-01 DIAGNOSIS — W1830XA Fall on same level, unspecified, initial encounter: Secondary | ICD-10-CM | POA: Diagnosis present

## 2021-09-01 LAB — RESP PANEL BY RT-PCR (FLU A&B, COVID) ARPGX2
Influenza A by PCR: NEGATIVE
Influenza B by PCR: NEGATIVE
SARS Coronavirus 2 by RT PCR: NEGATIVE

## 2021-09-01 LAB — LACTIC ACID, PLASMA
Lactic Acid, Venous: 1.1 mmol/L (ref 0.5–1.9)
Lactic Acid, Venous: 1.1 mmol/L (ref 0.5–1.9)

## 2021-09-01 LAB — TSH: TSH: 10.634 u[IU]/mL — ABNORMAL HIGH (ref 0.350–4.500)

## 2021-09-01 LAB — URINALYSIS, ROUTINE W REFLEX MICROSCOPIC
Bilirubin Urine: NEGATIVE
Glucose, UA: NEGATIVE mg/dL
Hgb urine dipstick: NEGATIVE
Ketones, ur: NEGATIVE mg/dL
Leukocytes,Ua: NEGATIVE
Nitrite: NEGATIVE
Protein, ur: NEGATIVE mg/dL
Specific Gravity, Urine: 1.015 (ref 1.005–1.030)
pH: 7 (ref 5.0–8.0)

## 2021-09-01 LAB — COMPREHENSIVE METABOLIC PANEL
ALT: 21 U/L (ref 0–44)
AST: 29 U/L (ref 15–41)
Albumin: 2.7 g/dL — ABNORMAL LOW (ref 3.5–5.0)
Alkaline Phosphatase: 145 U/L — ABNORMAL HIGH (ref 38–126)
Anion gap: 9 (ref 5–15)
BUN: 24 mg/dL — ABNORMAL HIGH (ref 8–23)
CO2: 32 mmol/L (ref 22–32)
Calcium: 8.5 mg/dL — ABNORMAL LOW (ref 8.9–10.3)
Chloride: 94 mmol/L — ABNORMAL LOW (ref 98–111)
Creatinine, Ser: 0.58 mg/dL — ABNORMAL LOW (ref 0.61–1.24)
GFR, Estimated: >60 mL/min (ref >60)
Glucose, Bld: 119 mg/dL — ABNORMAL HIGH (ref 70–99)
Potassium: 3.3 mmol/L — ABNORMAL LOW (ref 3.5–5.1)
Sodium: 135 mmol/L (ref 135–145)
Total Bilirubin: 0.6 mg/dL (ref 0.3–1.2)
Total Protein: 5.4 g/dL — ABNORMAL LOW (ref 6.5–8.1)

## 2021-09-01 LAB — CBC WITH DIFFERENTIAL/PLATELET
Abs Immature Granulocytes: 0.13 10*3/uL — ABNORMAL HIGH (ref 0.00–0.07)
Basophils Absolute: 0 10*3/uL (ref 0.0–0.1)
Basophils Relative: 0 %
Eosinophils Absolute: 0 10*3/uL (ref 0.0–0.5)
Eosinophils Relative: 0 %
HCT: 34.2 % — ABNORMAL LOW (ref 39.0–52.0)
Hemoglobin: 11.5 g/dL — ABNORMAL LOW (ref 13.0–17.0)
Immature Granulocytes: 1 %
Lymphocytes Relative: 4 %
Lymphs Abs: 0.6 10*3/uL — ABNORMAL LOW (ref 0.7–4.0)
MCH: 30.5 pg (ref 26.0–34.0)
MCHC: 33.6 g/dL (ref 30.0–36.0)
MCV: 90.7 fL (ref 80.0–100.0)
Monocytes Absolute: 0.8 10*3/uL (ref 0.1–1.0)
Monocytes Relative: 6 %
Neutro Abs: 11.2 10*3/uL — ABNORMAL HIGH (ref 1.7–7.7)
Neutrophils Relative %: 89 %
Platelets: 192 10*3/uL (ref 150–400)
RBC: 3.77 MIL/uL — ABNORMAL LOW (ref 4.22–5.81)
RDW: 14.1 % (ref 11.5–15.5)
WBC: 12.7 10*3/uL — ABNORMAL HIGH (ref 4.0–10.5)
nRBC: 0 % (ref 0.0–0.2)

## 2021-09-01 LAB — I-STAT CHEM 8, ED
BUN: 34 mg/dL — ABNORMAL HIGH (ref 8–23)
Calcium, Ion: 1.1 mmol/L — ABNORMAL LOW (ref 1.15–1.40)
Chloride: 93 mmol/L — ABNORMAL LOW (ref 98–111)
Creatinine, Ser: 0.5 mg/dL — ABNORMAL LOW (ref 0.61–1.24)
Glucose, Bld: 114 mg/dL — ABNORMAL HIGH (ref 70–99)
HCT: 33 % — ABNORMAL LOW (ref 39.0–52.0)
Hemoglobin: 11.2 g/dL — ABNORMAL LOW (ref 13.0–17.0)
Potassium: 3.5 mmol/L (ref 3.5–5.1)
Sodium: 135 mmol/L (ref 135–145)
TCO2: 34 mmol/L — ABNORMAL HIGH (ref 22–32)

## 2021-09-01 LAB — CK: Total CK: 190 U/L (ref 49–397)

## 2021-09-01 LAB — ETHANOL: Alcohol, Ethyl (B): <10 mg/dL (ref <10)

## 2021-09-01 LAB — PROTIME-INR
INR: 1.1 (ref 0.8–1.2)
INR: 1.1 (ref 0.8–1.2)
Prothrombin Time: 13.8 seconds (ref 11.4–15.2)
Prothrombin Time: 14.2 seconds (ref 11.4–15.2)

## 2021-09-01 LAB — MAGNESIUM: Magnesium: 1.7 mg/dL (ref 1.7–2.4)

## 2021-09-01 LAB — TROPONIN I (HIGH SENSITIVITY): Troponin I (High Sensitivity): 11 ng/L (ref <18)

## 2021-09-01 MED ORDER — LACTATED RINGERS IV BOLUS
1000.0000 mL | Freq: Once | INTRAVENOUS | Status: AC
  Administered 2021-09-01: 1000 mL via INTRAVENOUS

## 2021-09-01 NOTE — ED Triage Notes (Signed)
Pt BIB [facility] from home after reported fall at home. Per EMS, pt found on floor by family after unknown down time. EMS reports possible stroke d/t L sided deficits and slurred speech. Unknown LKW. MD aware and to bedside.

## 2021-09-01 NOTE — ED Provider Notes (Signed)
[facility] EMERGENCY DEPARTMENT Provider Note   CSN: [id number] Arrival date & time: 09/08/2021  1740     History Chief Complaint  Patient presents with   Alan [name] is a 75 y.o. male.   Fall Patient presents for generalized weakness and confusion.  History is provided by his nephew.  Patient's nephew reports that the patient lives alone independently.  Nephew stops by daily to help with medications.  Yesterday, patient was found on his bathroom floor.  He was helped up and seemed to be at his baseline.  Today, patient was again found on the floor.  When his nephew attempted to help him to his feet, patient was too weak to stand assisted.  At that point, EMS was called.  Patient's nephew reports that the patient has had ongoing weight loss in the setting of decreased appetite.  Per chart review, patient has history of PTSD, schizophrenia, and hypertension.  In July of last year, he presented for altered mental status, generalized weakness, and fall.  At that time, he was found to have a urinary infection and rhabdomyolysis in addition to a left proximal humerus fracture.  It appears he was lost to follow-up following this hospitalization.     Past Medical History:  Diagnosis Date   Esophageal stricture    Hypertension    PTSD (post-traumatic stress disorder)    Stroke [facility])     Patient Active Problem List   Diagnosis Date Noted   Humerus fracture 99991111   Acute metabolic encephalopathy 99991111   Hypomagnesemia 05/09/2020   Pressure injury of skin 05/07/2020   Rhabdomyolysis 05/06/2020   Altered mental status    Elevated CK    Urinary tract infection without hematuria 02/16/2012   Generalized weakness 02/16/2012   HTN (hypertension) 02/16/2012   Cough 02/16/2012   Tobacco abuse 02/16/2012   Severe protein-calorie malnutrition ([place]) 02/16/2012   Hyponatremia 02/16/2012   Hypokalemia 02/16/2012   Dehydration with hyponatremia  02/16/2012   Schizophrenia ([place]) 02/16/2012   Depression 02/16/2012    Past Surgical History:  Procedure Laterality Date   MELANOMA EXCISION     ORIF FEMUR FRACTURE- LISS PLATE         No family history on file.  Social History   Tobacco Use   Smoking status: Every Day    Packs/day: 1.50    Types: Cigarettes   Smokeless tobacco: Never  Substance Use Topics   Alcohol use: No   Drug use: No    Home Medications Prior to Admission medications   Medication Sig Start Date End Date Taking? Authorizing Provider  acetaminophen (TYLENOL) 500 MG tablet Take 500-1,000 mg by mouth every 6 (six) hours as needed for moderate pain or headache.   Yes [provider]  amLODipine (NORVASC) 5 MG tablet Take 5 mg by mouth in the morning and at bedtime.   Yes [provider]  carvedilol (COREG) 25 MG tablet Take 25 mg by mouth 2 (two) times daily. 06/26/21  Yes [provider]  Cholecalciferol (VITAMIN D3 PO) Take 1 tablet by mouth daily.   Yes [provider]  hydrochlorothiazide (MICROZIDE) 12.5 MG capsule Take 12.5 mg by mouth daily. 06/26/21  Yes [provider]  mirtazapine (REMERON) 15 MG tablet Take 15 mg by mouth every evening. 07/26/21  Yes [provider]  Multiple Vitamin (MULTIVITAMIN WITH MINERALS) TABS tablet Take 1 tablet by mouth daily. 05/10/20  Yes Regalado, Belkys A, MD  risperiDONE (RISPERDAL)  1 MG tablet Take 1 mg by mouth daily.   Yes [provider]  vitamin E 400 UNIT capsule Take 400 Units by mouth every morning.   Yes [provider]  carvedilol (COREG) 12.5 MG tablet Take 1 tablet (12.5 mg total) by mouth 2 (two) times daily with a meal. Patient not taking: No sig reported 05/09/20   Regalado, Belkys A, MD  phosphorus (K PHOS NEUTRAL) 155-852-130 MG tablet Take 2 tablets (500 mg total) by mouth 3 (three) times daily. Patient not taking: No sig reported 05/09/20   Regalado, Belkys A, MD  tiotropium  (SPIRIVA) 18 MCG inhalation capsule Place 1 capsule (18 mcg total) into inhaler and inhale daily. Patient not taking: No sig reported 02/20/12 02/19/13  Theodis Blaze, MD    Allergies    Patient has no known allergies.  Review of Systems   Review of Systems  Unable to perform ROS: Mental status change   Physical Exam Updated Vital Signs BP (!) 114/51   Pulse 63   Temp 97.8 F (36.6 C) (Oral)   Resp (!) 22   SpO2 97%   Physical Exam Constitutional:      General: He is awake.     Appearance: He is cachectic. He is ill-appearing. He is not toxic-appearing or diaphoretic.  HENT:     Head: Normocephalic and atraumatic.     Right Ear: External ear normal.     Left Ear: External ear normal.     Nose: Nose normal.     Mouth/Throat:     Pharynx: Oropharynx is clear.  Eyes:     Extraocular Movements: Extraocular movements intact.     Pupils: Pupils are equal, round, and reactive to light.  Cardiovascular:     Rate and Rhythm: Normal rate and regular rhythm.  Pulmonary:     Effort: Pulmonary effort is normal.     Breath sounds: Decreased breath sounds present. No wheezing, rhonchi or rales.     Comments: Hypoxic on room air, resolved with supplemental oxygen Chest:     Chest wall: No tenderness.  Abdominal:     General: Abdomen is flat.     Palpations: Abdomen is soft.     Tenderness: There is no abdominal tenderness.  Musculoskeletal:        General: No tenderness or deformity.     Cervical back: Normal range of motion and neck supple.     Comments: Decreased range of motion at left shoulder  Skin:    General: Skin is dry.     Coloration: Skin is pale.     Comments: Pressure wound to left posterior hip with scabbing in the middle and surrounding erythema.  Neurological:     General: No focal deficit present.     Mental Status: He is alert. He is disoriented.     Cranial Nerves: No cranial nerve deficit.     Sensory: No sensory deficit.     Motor: No weakness.      Coordination: Coordination normal.     Comments: Patient's family member reports that he is only slightly more confused than baseline.  Psychiatric:        Mood and Affect: Mood normal.        Behavior: Behavior normal. Behavior is cooperative.    ED Results / Procedures / Treatments   Labs (all labs ordered are listed, but only abnormal results are displayed) Labs Reviewed  URINALYSIS, ROUTINE W REFLEX MICROSCOPIC - Abnormal; Notable for the following components:  Result Value   APPearance CLOUDY (*)    All other components within normal limits  CBC WITH DIFFERENTIAL/PLATELET - Abnormal; Notable for the following components:   WBC 12.7 (*)    RBC 3.77 (*)    Hemoglobin 11.5 (*)    HCT 34.2 (*)    Neutro Abs 11.2 (*)    Lymphs Abs 0.6 (*)    Abs Immature Granulocytes 0.13 (*)    All other components within normal limits  TSH - Abnormal; Notable for the following components:   TSH 10.634 (*)    All other components within normal limits  COMPREHENSIVE METABOLIC PANEL - Abnormal; Notable for the following components:   Potassium 3.3 (*)    Chloride 94 (*)    Glucose, Bld 119 (*)    BUN 24 (*)    Creatinine, Ser 0.58 (*)    Calcium 8.5 (*)    Total Protein 5.4 (*)    Albumin 2.7 (*)    Alkaline Phosphatase 145 (*)    All other components within normal limits  T4, FREE - Abnormal; Notable for the following components:   Free T4 1.27 (*)    All other components within normal limits  I-STAT CHEM 8, ED - Abnormal; Notable for the following components:   Chloride 93 (*)    BUN 34 (*)    Creatinine, Ser 0.50 (*)    Glucose, Bld 114 (*)    Calcium, Ion 1.10 (*)    TCO2 34 (*)    Hemoglobin 11.2 (*)    HCT 33.0 (*)    All other components within normal limits  RESP PANEL BY RT-PCR (FLU A&B, COVID) ARPGX2  CULTURE, BLOOD (ROUTINE X 2)  CULTURE, BLOOD (ROUTINE X 2)  LACTIC ACID, PLASMA  PROTIME-INR  ETHANOL  LACTIC ACID, PLASMA  PROTIME-INR  CK  MAGNESIUM  T3, FREE   TROPONIN I (HIGH SENSITIVITY)    EKG EKG Interpretation  Date/Time:  Saturday September 01 2021 18:33:09 EST Ventricular Rate:  67 PR Interval:  156 QRS Duration: 97 QT Interval:  417 QTC Calculation: 441 R Axis:   69 Text Interpretation: Sinus rhythm Minimal ST depression, anterolateral leads Confirmed by Gloris Manchester [phone]) on 09/10/2021 6:54:05 PM  Radiology DG Chest 1 View  Result Date: 09/02/2021 CLINICAL DATA:  Fall, pain. EXAM: CHEST  1 VIEW COMPARISON:  Chest x-ray 05/06/2020. FINDINGS: There are atherosclerotic calcifications throughout the aorta. Cardiac silhouette is within normal limits. There are bands of atelectasis in the right lung base and left mid lung. There are patchy opacities in the left lung base with small left pleural effusion. There is no pneumothorax. No acute fractures are seen. IMPRESSION: 1. Small left pleural effusion with left basilar atelectasis/airspace disease. Electronically Signed   By: Darliss Cheney M.D.   On: 09/06/2021 20:00   DG Pelvis 1-2 Views  Result Date: 09/09/2021 CLINICAL DATA:  Fall EXAM: PELVIS - 1-2 VIEW COMPARISON:  Radiograph dated December 23rd 2011 FINDINGS: No definite displaced fracture. Evaluation is somewhat limited due to patient rotation. Limited evaluation of the bilateral iliac bones and sacrum due to a large amount of overlying bowel gas. Vascular calcifications. IMPRESSION: No definite displaced fracture. Exam is limited by patient rotation, osteopenia and large amount of of gas overlying the bilateral iliac bones and sacrum. Electronically Signed   By: Allegra Lai M.D.   On: 09/06/2021 20:05   CT HEAD WO CONTRAST  Result Date: 08/21/2021 CLINICAL DATA:  Head trauma, minor (Age >= 65y); Neck  trauma (Age >= 65y) EXAM: CT HEAD WITHOUT CONTRAST CT CERVICAL SPINE WITHOUT CONTRAST TECHNIQUE: Multidetector CT imaging of the head and cervical spine was performed following the standard protocol without intravenous contrast.  Multiplanar CT image reconstructions of the cervical spine were also generated. COMPARISON:  May 06, 2020 FINDINGS: CT HEAD FINDINGS Brain: No evidence of acute infarction, hemorrhage, hydrocephalus, extra-axial collection or mass lesion/mass effect. Global parenchymal volume loss, advanced for age. Encephalomalacia in the RIGHT ACA distribution. Encephalomalacia of the LEFT parietal lobe. Periventricular white matter hypodensities consistent with sequela of chronic microvascular ischemic disease. Vascular: Vascular calcifications. Skull: No acute fracture. Sinuses/Orbits: Revisualization of a soft tissue mass of the RIGHT mastoid with extension into the middle ear with progressive erosive changes of the ossicles. RIGHT mastoid effusion, similar in comparison to prior. Other: LEFT ear cerumen. CT CERVICAL SPINE FINDINGS Alignment: Normal. Skull base and vertebrae: There is new irregular erosive change along the superior margin of the LEFT facet of C5 with the loose sclerotic osseous fragment noted in the facet space (series 9, image 32). Mild erosive irregularity of the inferior aspect of the LEFT facet of C4 (series 8, image 44). Soft tissues and spinal canal: No prevertebral fluid or swelling. No visible canal hematoma. Disc levels:  Mild uncovertebral hypertrophy at C3-4. Upper chest: Paraseptal and centrilobular emphysema. Unchanged subcentimeter RIGHT thyroid nodule for which no specific follow-up is recommended. Other: Severe atherosclerotic calcifications of the carotid arteries. IMPRESSION: 1. New widening of the LEFT facet of C4-5 with apparent erosive changes along the facet. Differential considerations include osteomyelitis, neoplasm, inflammatory process or sequela of trauma. Recommend further dedicated evaluation with contrasted enhanced MRI cervical spine. 2.  No acute intracranial abnormality. 3. 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 There  has been progressive extension of the soft tissue mass to the ossicles. This could be further assessed with dedicated brain MRI. Electronically Signed   By: Valentino Saxon M.D.   On: 09/18/2021 19:34   CT Angio Chest PE W and/or Wo Contrast  Result Date: 09/02/2021 CLINICAL DATA:  Hypoxia EXAM: CT ANGIOGRAPHY CHEST WITH CONTRAST TECHNIQUE: Multidetector CT imaging of the chest was performed using the standard protocol during bolus administration of intravenous contrast. Multiplanar CT image reconstructions and MIPs were obtained to evaluate the vascular anatomy. CONTRAST:  71mL OMNIPAQUE IOHEXOL 350 MG/ML SOLN COMPARISON:  Chest radiograph dated 09/18/2021 FINDINGS: Cardiovascular: Satisfactory opacification the bilateral pulmonary arteries to the segmental level. No evidence of pulmonary embolism. Although not tailored for evaluation of the thoracic aorta, there is no evidence thoracic aortic aneurysm or dissection. Atherosclerotic calcifications of the aortic arch. The heart is normal in size.  No pericardial effusion. Three vessel coronary atherosclerosis. Mediastinum/Nodes: No suspicious mediastinal lymphadenopathy. Visualized thyroid is notable for an 11 mm right thyroid nodule (series 5/image 27). Not clinically significant; no follow-up imaging recommended (ref: J Am Coll Radiol. 2015 Feb;12(2): 143-50). Lungs/Pleura: No suspicious pulmonary nodules. Moderate left and small right pleural effusions. No frank interstitial edema. Associated bilateral lower lobe atelectasis. No focal consolidation. No pneumothorax. Upper Abdomen: Visualized upper abdomen is notable for bilateral renal cysts and vascular calcifications. Musculoskeletal: Mild superior endplate compression fracture deformity at T7. Moderate compression fracture deformity at T9 with minimal retropulsion. These are both age indeterminate. Review of the MIP images confirms the above findings. IMPRESSION: No evidence of pulmonary embolism.  Moderate left and small right pleural effusions. Associated bilateral lower lobe atelectasis. No frank interstitial edema. Mild superior  endplate compression fracture deformity at T7. Moderate compression fracture deformity at T9 with mild retropulsion. These are both age indeterminate. Aortic Atherosclerosis (ICD10-I70.0). Electronically Signed   By: Julian Hy M.D.   On: 09/02/2021 01:49   CT CERVICAL SPINE WO CONTRAST  Result Date: 08/27/2021 CLINICAL DATA:  Head trauma, minor (Age >= 65y); Neck trauma (Age >= 65y) EXAM: CT HEAD WITHOUT CONTRAST CT CERVICAL SPINE WITHOUT CONTRAST TECHNIQUE: Multidetector CT imaging of the head and cervical spine was performed following the standard protocol without intravenous contrast. Multiplanar CT image reconstructions of the cervical spine were also generated. COMPARISON:  May 06, 2020 FINDINGS: CT HEAD FINDINGS Brain: No evidence of acute infarction, hemorrhage, hydrocephalus, extra-axial collection or mass lesion/mass effect. Global parenchymal volume loss, advanced for age. Encephalomalacia in the RIGHT ACA distribution. Encephalomalacia of the LEFT parietal lobe. Periventricular white matter hypodensities consistent with sequela of chronic microvascular ischemic disease. Vascular: Vascular calcifications. Skull: No acute fracture. Sinuses/Orbits: Revisualization of a soft tissue mass of the RIGHT mastoid with extension into the middle ear with progressive erosive changes of the ossicles. RIGHT mastoid effusion, similar in comparison to prior. Other: LEFT ear cerumen. CT CERVICAL SPINE FINDINGS Alignment: Normal. Skull base and vertebrae: There is new irregular erosive change along the superior margin of the LEFT facet of C5 with the loose sclerotic osseous fragment noted in the facet space (series 9, image 32). Mild erosive irregularity of the inferior aspect of the LEFT facet of C4 (series 8, image 44). Soft tissues and spinal canal: No prevertebral fluid  or swelling. No visible canal hematoma. Disc levels:  Mild uncovertebral hypertrophy at C3-4. Upper chest: Paraseptal and centrilobular emphysema. Unchanged subcentimeter RIGHT thyroid nodule for which no specific follow-up is recommended. Other: Severe atherosclerotic calcifications of the carotid arteries. IMPRESSION: 1. New widening of the LEFT facet of C4-5 with apparent erosive changes along the facet. Differential considerations include osteomyelitis, neoplasm, inflammatory process or sequela of trauma. Recommend further dedicated evaluation with contrasted enhanced MRI cervical spine. 2.  No acute intracranial abnormality. 3. 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 There has been progressive extension of the soft tissue mass to the ossicles. This could be further assessed with dedicated brain MRI. Electronically Signed   By: Valentino Saxon M.D.   On: 09/08/2021 19:34   DG Shoulder Left  Result Date: 08/22/2021 CLINICAL DATA:  Fall, left shoulder pain EXAM: LEFT SHOULDER - 2+ VIEW COMPARISON:  CT left shoulder dated 05/07/2020 FINDINGS: Left shoulder deformity related to prior impacted femoral neck fracture. No acute fracture is seen. Visualized soft tissues are within normal limits. Visualized left lung is notable for a suspected small left pleural effusion. IMPRESSION: Left shoulder deformity related to prior impractical femoral neck fracture. No acute fracture is seen. Suspected small left pleural effusion. Electronically Signed   By: Julian Hy M.D.   On: 09/19/2021 20:02    Procedures Procedures   Medications Ordered in ED Medications  lactated ringers infusion ( Intravenous New Bag/Given 09/02/21 0137)  vancomycin (VANCOREADY) IVPB 1250 mg/250 mL (has no administration in time range)  ceFEPIme (MAXIPIME) 2 g in sodium chloride 0.9 % 100 mL IVPB (has no administration in time range)  vancomycin (VANCOREADY) IVPB 1000  mg/200 mL (has no administration in time range)  lactated ringers bolus 1,000 mL (0 mLs Intravenous Stopped 09/17/2021 2045)  lactated ringers bolus 1,000 mL (1,000 mLs Intravenous New Bag/Given 09/02/21 0143)  iohexol (OMNIPAQUE) 350 MG/ML injection 75  mL (75 mLs Intravenous Contrast Given 09/02/21 0114)  ceFEPIme (MAXIPIME) 2 g in sodium chloride 0.9 % 100 mL IVPB (2 g Intravenous New Bag/Given 09/02/21 0125)    ED Course  I have reviewed the triage vital signs and the nursing notes.  Pertinent labs & imaging results that were available during my care of the patient were reviewed by me and considered in my medical decision making (see chart for details).    MDM Rules/Calculators/A&P                          Patient presents for generalized weakness and altered mental status.  He was last seen normal by his nephew yesterday morning.  Today, he was found on the floor following a presumed unwitnessed fall.  History is unable to be obtained on the patient himself.  This is due to his difficulty with speech, given his edentulousness, in addition to what appears to be confusion.  Patient's family number states that the babbling that he is doing is baseline for him.  Initial vital signs are notable for hypoxia with SPO2 of low to mid 80s on room air.  Patient does not wear oxygen at baseline.  He was placed on supplemental oxygen with resolution of his hypoxia.  On lung auscultation, he does have diminished breath sounds in posterior lung fields.  No wheezes are appreciated.  Physical exam is also notable for extreme cachexia and a pressure wound to the posterior aspect of his left hip.  This wound does have surrounding erythema and tenderness with it.  Given patient's unknown downtime, bolus of IV fluids was given.  Broad work-up was initiated.  CT scan of head showed the revisualization of a soft tissue mass of the right mastoid antrum.  MRI was recommended.  Additionally, CT scan of cervical spine showed  new widening of left facet of C4-5.  Again, contrast-enhanced MRI was recommended.  The studies were ordered.  X-ray of chest showed a small left pleural effusion.  X-ray of pelvis and left shoulder were negative for acute findings.  On reassessment, patient resting in no acute distress.  When taken off of supplemental oxygen, he remains hypoxic.  While in the ED, he did have multiple low blood pressure readings.  These were taken when he was in the right lateral decubitus position with blood pressure being measured on his left arm.  He remained in that right lateral decubitus position to avoid placing weight on the decubitus ulcer of his left hip.  When positioned onto his back, patient's blood pressures were in the low range of normal.  Given his continued hypoxia, CTA of chest was ordered.  Given concern for pneumonia, aspiration pneumonitis, and/or cellulitis/osteomyelitis of his left hip, broad-spectrum antibiotics were ordered.  Additional bolus of IV fluids was ordered. CTA of chest showed bilateral pleural effusions without focal consolidations.  Given his persistent hypoxia, patient will require admission.  I spoke with hospitalist, Dr. Alcario Drought, who stated that he would prefer to wait for results of MRIs due to concern of cervical cord injury prior to admission.  Care of patient was signed out to oncoming ED provider.  Final Clinical Impression(s) / ED Diagnoses Final diagnoses:  Hypoxia    Rx / DC Orders ED Discharge Orders     None        Godfrey Pick, MD 09/02/21 [phone]

## 2021-09-01 NOTE — ED Notes (Signed)
Patient transported to X-ray 

## 2021-09-02 ENCOUNTER — Other Ambulatory Visit: Payer: Self-pay

## 2021-09-02 ENCOUNTER — Emergency Department (HOSPITAL_COMMUNITY): Payer: Medicare Other

## 2021-09-02 ENCOUNTER — Encounter (HOSPITAL_COMMUNITY): Payer: Self-pay | Admitting: Internal Medicine

## 2021-09-02 ENCOUNTER — Observation Stay (HOSPITAL_COMMUNITY): Payer: Medicare Other

## 2021-09-02 DIAGNOSIS — E876 Hypokalemia: Secondary | ICD-10-CM | POA: Diagnosis present

## 2021-09-02 DIAGNOSIS — L89329 Pressure ulcer of left buttock, unspecified stage: Secondary | ICD-10-CM

## 2021-09-02 DIAGNOSIS — L03317 Cellulitis of buttock: Secondary | ICD-10-CM | POA: Diagnosis present

## 2021-09-02 DIAGNOSIS — E860 Dehydration: Secondary | ICD-10-CM | POA: Diagnosis present

## 2021-09-02 DIAGNOSIS — R0602 Shortness of breath: Secondary | ICD-10-CM | POA: Diagnosis present

## 2021-09-02 DIAGNOSIS — K5900 Constipation, unspecified: Secondary | ICD-10-CM | POA: Diagnosis not present

## 2021-09-02 DIAGNOSIS — I10 Essential (primary) hypertension: Secondary | ICD-10-CM | POA: Diagnosis present

## 2021-09-02 DIAGNOSIS — L89152 Pressure ulcer of sacral region, stage 2: Secondary | ICD-10-CM | POA: Diagnosis present

## 2021-09-02 DIAGNOSIS — Z85820 Personal history of malignant melanoma of skin: Secondary | ICD-10-CM | POA: Diagnosis not present

## 2021-09-02 DIAGNOSIS — R451 Restlessness and agitation: Secondary | ICD-10-CM | POA: Diagnosis not present

## 2021-09-02 DIAGNOSIS — R64 Cachexia: Secondary | ICD-10-CM | POA: Diagnosis present

## 2021-09-02 DIAGNOSIS — J90 Pleural effusion, not elsewhere classified: Secondary | ICD-10-CM | POA: Diagnosis present

## 2021-09-02 DIAGNOSIS — W19XXXA Unspecified fall, initial encounter: Secondary | ICD-10-CM | POA: Diagnosis not present

## 2021-09-02 DIAGNOSIS — E041 Nontoxic single thyroid nodule: Secondary | ICD-10-CM | POA: Diagnosis present

## 2021-09-02 DIAGNOSIS — Z515 Encounter for palliative care: Secondary | ICD-10-CM | POA: Diagnosis not present

## 2021-09-02 DIAGNOSIS — L89222 Pressure ulcer of left hip, stage 2: Secondary | ICD-10-CM | POA: Diagnosis present

## 2021-09-02 DIAGNOSIS — F4310 Post-traumatic stress disorder, unspecified: Secondary | ICD-10-CM | POA: Diagnosis present

## 2021-09-02 DIAGNOSIS — J9601 Acute respiratory failure with hypoxia: Secondary | ICD-10-CM | POA: Diagnosis present

## 2021-09-02 DIAGNOSIS — R531 Weakness: Secondary | ICD-10-CM | POA: Diagnosis not present

## 2021-09-02 DIAGNOSIS — E0781 Sick-euthyroid syndrome: Secondary | ICD-10-CM | POA: Diagnosis present

## 2021-09-02 DIAGNOSIS — M4854XA Collapsed vertebra, not elsewhere classified, thoracic region, initial encounter for fracture: Secondary | ICD-10-CM | POA: Diagnosis present

## 2021-09-02 DIAGNOSIS — S32000A Wedge compression fracture of unspecified lumbar vertebra, initial encounter for closed fracture: Secondary | ICD-10-CM | POA: Diagnosis not present

## 2021-09-02 DIAGNOSIS — M4856XA Collapsed vertebra, not elsewhere classified, lumbar region, initial encounter for fracture: Secondary | ICD-10-CM | POA: Diagnosis present

## 2021-09-02 DIAGNOSIS — E43 Unspecified severe protein-calorie malnutrition: Secondary | ICD-10-CM | POA: Diagnosis present

## 2021-09-02 DIAGNOSIS — R296 Repeated falls: Secondary | ICD-10-CM | POA: Diagnosis present

## 2021-09-02 DIAGNOSIS — Z681 Body mass index (BMI) 19 or less, adult: Secondary | ICD-10-CM | POA: Diagnosis not present

## 2021-09-02 DIAGNOSIS — R0902 Hypoxemia: Secondary | ICD-10-CM | POA: Diagnosis present

## 2021-09-02 DIAGNOSIS — G9341 Metabolic encephalopathy: Secondary | ICD-10-CM | POA: Diagnosis present

## 2021-09-02 DIAGNOSIS — Z66 Do not resuscitate: Secondary | ICD-10-CM | POA: Diagnosis present

## 2021-09-02 DIAGNOSIS — W1830XA Fall on same level, unspecified, initial encounter: Secondary | ICD-10-CM | POA: Diagnosis present

## 2021-09-02 DIAGNOSIS — Z20822 Contact with and (suspected) exposure to covid-19: Secondary | ICD-10-CM | POA: Diagnosis present

## 2021-09-02 DIAGNOSIS — R0600 Dyspnea, unspecified: Secondary | ICD-10-CM | POA: Diagnosis not present

## 2021-09-02 DIAGNOSIS — Y92009 Unspecified place in unspecified non-institutional (private) residence as the place of occurrence of the external cause: Secondary | ICD-10-CM | POA: Diagnosis not present

## 2021-09-02 DIAGNOSIS — R627 Adult failure to thrive: Secondary | ICD-10-CM | POA: Diagnosis present

## 2021-09-02 DIAGNOSIS — F209 Schizophrenia, unspecified: Secondary | ICD-10-CM | POA: Diagnosis present

## 2021-09-02 LAB — COMPREHENSIVE METABOLIC PANEL
ALT: 18 U/L (ref 0–44)
AST: 27 U/L (ref 15–41)
Albumin: 2.4 g/dL — ABNORMAL LOW (ref 3.5–5.0)
Alkaline Phosphatase: 131 U/L — ABNORMAL HIGH (ref 38–126)
Anion gap: 7 (ref 5–15)
BUN: 18 mg/dL (ref 8–23)
CO2: 32 mmol/L (ref 22–32)
Calcium: 7.6 mg/dL — ABNORMAL LOW (ref 8.9–10.3)
Chloride: 94 mmol/L — ABNORMAL LOW (ref 98–111)
Creatinine, Ser: 0.42 mg/dL — ABNORMAL LOW (ref 0.61–1.24)
GFR, Estimated: >60 mL/min (ref >60)
Glucose, Bld: 98 mg/dL (ref 70–99)
Potassium: 3.1 mmol/L — ABNORMAL LOW (ref 3.5–5.1)
Sodium: 133 mmol/L — ABNORMAL LOW (ref 135–145)
Total Bilirubin: 0.6 mg/dL (ref 0.3–1.2)
Total Protein: 5 g/dL — ABNORMAL LOW (ref 6.5–8.1)

## 2021-09-02 LAB — CBC
HCT: 32.9 % — ABNORMAL LOW (ref 39.0–52.0)
Hemoglobin: 11 g/dL — ABNORMAL LOW (ref 13.0–17.0)
MCH: 30.7 pg (ref 26.0–34.0)
MCHC: 33.4 g/dL (ref 30.0–36.0)
MCV: 91.9 fL (ref 80.0–100.0)
Platelets: 167 10*3/uL (ref 150–400)
RBC: 3.58 MIL/uL — ABNORMAL LOW (ref 4.22–5.81)
RDW: 13.9 % (ref 11.5–15.5)
WBC: 9.2 10*3/uL (ref 4.0–10.5)
nRBC: 0 % (ref 0.0–0.2)

## 2021-09-02 LAB — PROCALCITONIN: Procalcitonin: <0.1 ng/mL

## 2021-09-02 LAB — T4, FREE: Free T4: 1.27 ng/dL — ABNORMAL HIGH (ref 0.61–1.12)

## 2021-09-02 MED ORDER — VITAMIN D 25 MCG (1000 UNIT) PO TABS
1000.0000 [IU] | ORAL_TABLET | Freq: Every day | ORAL | Status: DC
  Administered 2021-09-02 – 2021-09-04 (×3): 1000 [IU] via ORAL
  Filled 2021-09-02 (×3): qty 1

## 2021-09-02 MED ORDER — RISPERIDONE 1 MG PO TABS
1.0000 mg | ORAL_TABLET | Freq: Every day | ORAL | Status: DC
  Administered 2021-09-02 – 2021-09-10 (×8): 1 mg via ORAL
  Filled 2021-09-02 (×10): qty 1

## 2021-09-02 MED ORDER — BISACODYL 10 MG RE SUPP: 10.0000 mg | Freq: Every day | RECTAL | Status: DC

## 2021-09-02 MED ORDER — LACTATED RINGERS IV BOLUS
1000.0000 mL | Freq: Once | INTRAVENOUS | Status: AC
  Administered 2021-09-02: 1000 mL via INTRAVENOUS

## 2021-09-02 MED ORDER — VANCOMYCIN HCL 1250 MG/250ML IV SOLN
1250.0000 mg | Freq: Once | INTRAVENOUS | Status: AC
  Administered 2021-09-02: 1250 mg via INTRAVENOUS
  Filled 2021-09-02: qty 250

## 2021-09-02 MED ORDER — DOCUSATE SODIUM 100 MG PO CAPS
200.0000 mg | ORAL_CAPSULE | Freq: Two times a day (BID) | ORAL | Status: DC
  Administered 2021-09-02 – 2021-09-07 (×5): 200 mg via ORAL
  Filled 2021-09-02 (×9): qty 2

## 2021-09-02 MED ORDER — MIRTAZAPINE 15 MG PO TABS
15.0000 mg | ORAL_TABLET | Freq: Every evening | ORAL | Status: DC
  Administered 2021-09-02 – 2021-09-03 (×2): 15 mg via ORAL
  Filled 2021-09-02 (×2): qty 1

## 2021-09-02 MED ORDER — ACETAMINOPHEN 650 MG RE SUPP: 650.0000 mg | Freq: Four times a day (QID) | RECTAL | Status: DC | PRN

## 2021-09-02 MED ORDER — MAGNESIUM SULFATE IN D5W 1-5 GM/100ML-% IV SOLN
1.0000 g | Freq: Once | INTRAVENOUS | Status: AC
  Administered 2021-09-02: 1 g via INTRAVENOUS
  Filled 2021-09-02: qty 100

## 2021-09-02 MED ORDER — AMLODIPINE BESYLATE 5 MG PO TABS
5.0000 mg | ORAL_TABLET | Freq: Every day | ORAL | Status: DC
  Administered 2021-09-02: 5 mg via ORAL
  Filled 2021-09-02 (×2): qty 1

## 2021-09-02 MED ORDER — IOHEXOL 350 MG/ML SOLN
75.0000 mL | Freq: Once | INTRAVENOUS | Status: AC | PRN
  Administered 2021-09-02: 75 mL via INTRAVENOUS

## 2021-09-02 MED ORDER — LACTATED RINGERS IV SOLN: INTRAVENOUS | Status: DC

## 2021-09-02 MED ORDER — VITAMIN E 45 MG (100 UNIT) PO CAPS
400.0000 [IU] | ORAL_CAPSULE | Freq: Every morning | ORAL | Status: DC
  Administered 2021-09-02 – 2021-09-04 (×3): 400 [IU] via ORAL
  Filled 2021-09-02 (×4): qty 4

## 2021-09-02 MED ORDER — MILK AND MOLASSES ENEMA
1.0000 | Freq: Once | RECTAL | Status: DC
  Filled 2021-09-02: qty 240

## 2021-09-02 MED ORDER — SODIUM CHLORIDE 0.9 % IV SOLN: 2.0000 g | Freq: Two times a day (BID) | INTRAVENOUS | Status: DC

## 2021-09-02 MED ORDER — BISACODYL 5 MG PO TBEC: 5.0000 mg | DELAYED_RELEASE_TABLET | Freq: Every day | ORAL | Status: DC | PRN

## 2021-09-02 MED ORDER — DOCUSATE SODIUM 100 MG PO CAPS: 100.0000 mg | ORAL_CAPSULE | Freq: Two times a day (BID) | ORAL | Status: DC

## 2021-09-02 MED ORDER — HALOPERIDOL LACTATE 5 MG/ML IJ SOLN: 1.0000 mg | Freq: Four times a day (QID) | INTRAMUSCULAR | Status: DC | PRN

## 2021-09-02 MED ORDER — SODIUM CHLORIDE 0.9 % IV SOLN
1.0000 g | INTRAVENOUS | Status: DC
  Administered 2021-09-02 – 2021-09-04 (×3): 1 g via INTRAVENOUS
  Filled 2021-09-02 (×3): qty 10

## 2021-09-02 MED ORDER — ONDANSETRON HCL 4 MG/2ML IJ SOLN: 4.0000 mg | Freq: Four times a day (QID) | INTRAMUSCULAR | Status: DC | PRN

## 2021-09-02 MED ORDER — POTASSIUM CHLORIDE CRYS ER 20 MEQ PO TBCR
40.0000 meq | EXTENDED_RELEASE_TABLET | Freq: Two times a day (BID) | ORAL | Status: AC
  Administered 2021-09-02 (×2): 40 meq via ORAL
  Filled 2021-09-02 (×2): qty 2

## 2021-09-02 MED ORDER — ENOXAPARIN SODIUM 300 MG/3ML IJ SOLN
20.0000 mg | INTRAMUSCULAR | Status: DC
  Administered 2021-09-02 – 2021-09-03 (×2): 20 mg via SUBCUTANEOUS
  Filled 2021-09-02 (×4): qty 0.2

## 2021-09-02 MED ORDER — VANCOMYCIN HCL 1000 MG/200ML IV SOLN: 1000.0000 mg | INTRAVENOUS | Status: DC

## 2021-09-02 MED ORDER — PROSOURCE PLUS PO LIQD
30.0000 mL | Freq: Two times a day (BID) | ORAL | Status: DC
  Administered 2021-09-02 – 2021-09-04 (×4): 30 mL via ORAL
  Filled 2021-09-02 (×3): qty 30

## 2021-09-02 MED ORDER — COLLAGENASE 250 UNIT/GM EX OINT
TOPICAL_OINTMENT | Freq: Every day | CUTANEOUS | Status: DC
  Filled 2021-09-02 (×2): qty 30

## 2021-09-02 MED ORDER — ONDANSETRON HCL 4 MG PO TABS: 4.0000 mg | ORAL_TABLET | Freq: Four times a day (QID) | ORAL | Status: DC | PRN

## 2021-09-02 MED ORDER — ADULT MULTIVITAMIN W/MINERALS CH
1.0000 | ORAL_TABLET | Freq: Every day | ORAL | Status: DC
  Administered 2021-09-02 – 2021-09-04 (×3): 1 via ORAL
  Filled 2021-09-02 (×3): qty 1

## 2021-09-02 MED ORDER — BOOST PLUS PO LIQD
237.0000 mL | Freq: Three times a day (TID) | ORAL | Status: DC
  Administered 2021-09-02 – 2021-09-03 (×3): 237 mL via ORAL
  Filled 2021-09-02 (×5): qty 237

## 2021-09-02 MED ORDER — BISACODYL 10 MG RE SUPP
10.0000 mg | Freq: Every day | RECTAL | Status: DC
  Administered 2021-09-02: 10 mg via RECTAL
  Filled 2021-09-02 (×3): qty 1

## 2021-09-02 MED ORDER — CARVEDILOL 12.5 MG PO TABS
12.5000 mg | ORAL_TABLET | Freq: Two times a day (BID) | ORAL | Status: DC
  Administered 2021-09-02 – 2021-09-03 (×3): 12.5 mg via ORAL
  Filled 2021-09-02 (×3): qty 1

## 2021-09-02 MED ORDER — SODIUM CHLORIDE 0.9 % IV SOLN
2.0000 g | Freq: Once | INTRAVENOUS | Status: AC
  Administered 2021-09-02: 2 g via INTRAVENOUS
  Filled 2021-09-02: qty 2

## 2021-09-02 MED ORDER — HYDROMORPHONE HCL 1 MG/ML IJ SOLN
0.5000 mg | INTRAMUSCULAR | Status: DC | PRN
Start: 2021-09-02 — End: 2021-09-02

## 2021-09-02 MED ORDER — ACETAMINOPHEN 325 MG PO TABS: 650.0000 mg | ORAL_TABLET | Freq: Four times a day (QID) | ORAL | Status: DC | PRN

## 2021-09-02 NOTE — Progress Notes (Addendum)
OT Cancellation Note  Patient Details Name: Alan [name] MRN: [id number] DOB: [birth year]   Cancelled Treatment:    Reason Eval/Treat Not Completed: Patient not medically ready pending MRI and neurosurgery consult  Mateo Flow 09/02/2021, 8:15 AM  Timmothy Euler, OTR/L  Acute Rehabilitation Services Pager: [phone] Office: [phone]  Additional Note 3:02pm - Imaging came back positive for multiple problems. Pt still awaiting Neuro surgery consult, OT holding. Will not be able to attempt again until tomorrow.   Nyoka Cowden OTR/L Acute Rehabilitation Services Pager: [phone] Office: [phone]  .

## 2021-09-02 NOTE — Progress Notes (Signed)
PT Cancellation Note  Patient Details Name: Dmarco Baldus MRN: [id number] DOB: 09/10/1946   Cancelled Treatment:    Reason Eval/Treat Not Completed: Patient not medically ready  Noted MRI's of thoracic and lumbar spine pending, as well as neurosurgery consult. Will await results prior to PtT evaluation.    Jerolyn Center, PT Acute Rehabilitation Services  Pager [phone] Office [phone]    Zena Amos 09/02/2021, 7:41 AM

## 2021-09-02 NOTE — Progress Notes (Addendum)
PROGRESS NOTE                                                                                                                                                                                                             Patient Demographics:    Nickoli Trupp, is a 75 y.o. male, DOB - [birth year], UVJ:[id number]  Outpatient Primary MD for the patient is Barbie Banner, MD    LOS - 0  Admit date - 09-18-2021    Chief Complaint  Patient presents with   Fall       Brief Narrative (HPI from H&P)  -  Bretley Mendillo is a 75 y.o. male with medical history significant of HTN, PTSD, schizophrenia, prior stroke, patient lives alone and is under the supervision of his nephew who visits him almost daily, according to the nephew he has been gradually getting weak and losing weight over the last several months, day of  the admission he found the patient on the floor, this is happened a few times prior to admission recently, he was brought to the hospital where in the ER work-up showed hypotension, dehydration and shortness of breath along with severe cachexia, L-spine fractures failure to thrive and he was admitted for further care   Subjective:    Maximilian Hildebrandt today the hospital bed appears little confused unable to provide a good history but denies any headache, he does not think he is short of breath however he is somewhat confused and not a reliable historian.   Assessment  & Plan :     Severe protein calorie malnutrition with cachexia, failure to thrive, multiple falls with acute L-spine fracture - for now supportive care which includes protein supplementation, PT OT, pain control, hydration with IV fluids, his BMI is 14 and he appears extremely wasted and I do not think he will benefit from any surgical procedure at this time.  Goal of care will be medical treatment directed towards comfort, MRI of the L-spine has been ordered will  follow, if minimally invasive surgery can be offered and needed will consider after L-spine results.  There is no sepsis.  Note this patient is severely cachectic and malnourished with a BMI of 14, will cannot be discharged home alone with L-spine fractures and multiple falls, will definitely require SNF  2.  Mild to moderate  bilateral pleural effusion.  No symptoms at this time, supplemental oxygen this likely is third spacing due to severe protein deficit, supportive care if becomes more symptomatic then ultrasound-guided paracentesis most likely will be transudative fluid  3.  AMS.  Underlying vascular dementia, mild decline due to acute illness.  Supportive care.  Avoid benzodiazepines and narcotics.  4.  Schizophrenia.  Continue risperidone.  At risk for delirium.  Minimize narcotics benzodiazepines.  As needed Haldol  5.  Bowel impaction.  Disimpacted and bowel regimen.  6.  Hypertension.  Monitor off of medications hydrate with IV fluids as he appears dehydrated.  7.  Elevated TSH.  Could be sick euthyroid syndrome.  Check TSH along with free T4 & T3      Condition - Extremely Guarded  Family Communication  :  [name] [phone] called 09/02/20 at 8:10 am - DNR  Code Status :  DNR  Consults  :  Pall.Care  PUD Prophylaxis :     Procedures  :     CT Abd - Pelvis - 1. Acute comminuted compression fracture at L2 with loss of vertebral body height of 70% centrally. There is a retropulsed element resulting in mild spinal canal stenosis. 2. Compression fracture in the superior endplate at L1 which appears acute with loss of vertebral body height centrally of approximately 75%. There is a slight retropulsed element resulting in mild spinal canal stenosis. 3. Compression deformities at T9 and L4, indeterminate in age. 4. Large amount of stool in the colon and rectum. The rectum is markedly distended suggesting possibility of fecal impaction. 5. Small right pleural effusion and  moderate left pleural effusion with atelectasis or infiltrate at the lung bases. 6. Bilateral renal cysts. 7. Multiple scattered bladder diverticula.   CTA Lungs -  No evidence of pulmonary embolism. Moderate left and small right pleural effusions. Associated bilateral lower lobe atelectasis. No frank interstitial edema. Mild superior endplate compression fracture deformity at T7. Moderate compression fracture deformity at T9 with mild retropulsion. These are both age indeterminate. Aortic Atherosclerosis (ICD10-I70.0).  CT Head and C Spine -   Head : No evidence of acute infarction, hemorrhage, hydrocephalus, extra-axial collection or mass lesion/mass effect. Global parenchymal volume loss, advanced for age. Encephalomalacia in the RIGHT ACA distribution. Encephalomalacia of the LEFT parietal lobe. Periventricular white matter hypodensities consistent with sequela of chronic microvascular ischemic disease. Vascular: Vascular calcifications.   C Spine -  1. New widening of the LEFT facet of C4-5 with apparent erosive changes along the facet. Differential considerations include osteomyelitis, neoplasm, inflammatory process or sequela of trauma. Recommend further dedicated evaluation with contrasted enhanced MRI cervical spine. 2.  No acute intracranial abnormality. 3. 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 There has been progressive extension of the soft tissue mass to the ossicles. This could be further assessed with dedicated brain MRI.      Disposition Plan  :    Status is: Observation  DVT Prophylaxis  :  Lovenox    Lab Results  Component Value Date   PLT 167 09/02/2021    Diet :  Diet Order             Diet regular Room service appropriate? Yes; Fluid consistency: Thin  Diet effective now                    Inpatient Medications  Scheduled Meds:  amLODipine  5 mg Oral Daily   carvedilol  12.5 mg Oral BID    cholecalciferol  1,000 Units Oral Daily   docusate sodium  100 mg Oral BID   enoxaparin (LOVENOX) injection  20 mg Subcutaneous Q24H   milk and molasses  1 enema Rectal Once   mirtazapine  15 mg Oral QPM   multivitamin with minerals  1 tablet Oral Daily   potassium chloride  40 mEq Oral BID   risperiDONE  1 mg Oral Daily   vitamin E  400 Units Oral q morning   Continuous Infusions:  cefTRIAXone (ROCEPHIN)  IV Stopped (09/02/21 0543)   lactated ringers 75 mL/hr at 09/02/21 0725   magnesium sulfate bolus IVPB 1 g (09/02/21 0729)   PRN Meds:.acetaminophen **OR** acetaminophen, bisacodyl, HYDROmorphone (DILAUDID) injection, ondansetron **OR** ondansetron (ZOFRAN) IV  Antibiotics  :    Anti-infectives (From admission, onward)    Start     Dose/Rate Route Frequency Ordered Stop   09/03/21 1000  ceFEPIme (MAXIPIME) 2 g in sodium chloride 0.9 % 100 mL IVPB  Status:  Discontinued        2 g 200 mL/hr over 30 Minutes Intravenous Every 12 hours 09/02/21 0158 09/02/21 0354   09/03/21 0300  vancomycin (VANCOREADY) IVPB 1000 mg/200 mL  Status:  Discontinued        1,000 mg 200 mL/hr over 60 Minutes Intravenous Every 24 hours 09/02/21 0158 09/02/21 0354   09/02/21 0400  cefTRIAXone (ROCEPHIN) 1 g in sodium chloride 0.9 % 100 mL IVPB        1 g 200 mL/hr over 30 Minutes Intravenous Every 24 hours 09/02/21 0354 09/09/21 0359   09/02/21 0130  ceFEPIme (MAXIPIME) 2 g in sodium chloride 0.9 % 100 mL IVPB        2 g 200 mL/hr over 30 Minutes Intravenous  Once 09/02/21 0119 09/02/21 0357   09/02/21 0130  vancomycin (VANCOREADY) IVPB 1250 mg/250 mL        1,250 mg 166.7 mL/hr over 90 Minutes Intravenous  Once 09/02/21 0119 09/02/21 0539        Time Spent in minutes  30   Susa Raring M.D on 09/02/2021 at 8:11 AM  To page go to [email or web address]   [facility] -  Office  [phone]  See all Orders from today for further details    Objective:   Vitals:   09/02/21 0330 09/02/21  0348 09/02/21 0500 09/02/21 0615  BP: (!) 110/44  122/63 116/71  Pulse: 64  62 62  Resp: 18  18 (!) 24  Temp:      TempSrc:      SpO2: 98%  95% 90%  Weight:  39.8 kg    Height:  5' 5.5" (1.664 m)      Wt Readings from Last 3 Encounters:  09/02/21 39.8 kg  05/11/20 58 kg  02/20/12 43.3 kg     Intake/Output Summary (Last 24 hours) at 09/02/2021 4782 Last data filed at 09/02/2021 9562 Gross per 24 hour  Intake 2857.41 ml  Output --  Net 2857.41 ml     Physical Exam  Extremely frail and cachectic elderly white gentleman laying in hospital bed in no distress Burden.AT,PERRAL Supple Neck, No JVD,   Symmetrical Chest wall movement, Good air movement bilaterally, CTAB RRR,No Gallops,Rubs or new Murmurs,  +ve B.Sounds, Abd Soft, No tenderness,   No Cyanosis, Clubbing or edema     RN pressure injury documentation: Pressure Injury 05/06/20 Sacrum Mid Stage 2 -  Partial thickness loss of dermis presenting as a  shallow open injury with a red, pink wound bed without slough. Partial loss of dermis,shallow red wound bed with scant serous drainage. (Active)  05/06/20 1600  Location: Sacrum  Location Orientation: Mid  Staging: Stage 2 -  Partial thickness loss of dermis presenting as a shallow open injury with a red, pink wound bed without slough.  Wound Description (Comments): Partial loss of dermis,shallow red wound bed with scant serous drainage.  Present on Admission: Yes     Data Review:    CBC Recent Labs  Lab 09/02/2021 1839 09/13/2021 1845 09/02/21 0456  WBC 12.7*  --  9.2  HGB 11.5* 11.2* 11.0*  HCT 34.2* 33.0* 32.9*  PLT 192  --  167  MCV 90.7  --  91.9  MCH 30.5  --  30.7  MCHC 33.6  --  33.4  RDW 14.1  --  13.9  LYMPHSABS 0.6*  --   --   MONOABS 0.8  --   --   EOSABS 0.0  --   --   BASOSABS 0.0  --   --     Electrolytes Recent Labs  Lab 09/19/2021 1839 08/31/2021 1845 09/13/2021 2023 09/02/21 0456  NA  --  135 135 133*  K  --  3.5 3.3* 3.1*  CL  --  93* 94*  94*  CO2  --   --  32 32  GLUCOSE  --  114* 119* 98  BUN  --  34* 24* 18  CREATININE  --  0.50* 0.58* 0.42*  CALCIUM  --   --  8.5* 7.6*  AST  --   --  29 27  ALT  --   --  21 18  ALKPHOS  --   --  145* 131*  BILITOT  --   --  0.6 0.6  ALBUMIN  --   --  2.7* 2.4*  MG  --   --  1.7  --   PROCALCITON  --   --   --  <0.10  LATICACIDVEN 1.1  --  1.1  --   INR 1.1  --  1.1  --   TSH 10.634*  --   --   --     ------------------------------------------------------------------------------------------------------------------ No results for input(s): CHOL, HDL, LDLCALC, TRIG, CHOLHDL, LDLDIRECT in the last 72 hours.  No results found for: HGBA1C  Recent Labs    09/09/2021 1839  TSH 10.634*   ------------------------------------------------------------------------------------------------------------------ ID Labs Recent Labs  Lab 08/31/2021 1839 08/25/2021 1845 08/23/2021 2023 09/02/21 0456  WBC 12.7*  --   --  9.2  PLT 192  --   --  167  PROCALCITON  --   --   --  <0.10  LATICACIDVEN 1.1  --  1.1  --   CREATININE  --  0.50* 0.58* 0.42*   Cardiac Enzymes No results for input(s): CKMB, TROPONINI, MYOGLOBIN in the last 168 hours.  Invalid input(s): CK    Radiology Reports CT ABDOMEN PELVIS WO CONTRAST  Result Date: 09/02/2021 CLINICAL DATA:  Abdominal trauma. EXAM: CT ABDOMEN AND PELVIS WITHOUT CONTRAST TECHNIQUE: Multidetector CT imaging of the abdomen and pelvis was performed following the standard protocol without IV contrast. COMPARISON:  09/02/2021 FINDINGS: Lower chest: The heart is enlarged and multi-vessel coronary artery calcifications are noted. There is a moderate left pleural effusion and a small right pleural effusion with patchy atelectasis or infiltrate at the lung bases. Hepatobiliary: A few scattered hypodensities are present in the liver, most likely representing cysts or hemangioma is.  No biliary ductal dilatation. The gallbladder is not visualized on exam.  Pancreas: Within normal limits on noncontrast exam. Spleen: Normal in size without focal abnormality. Adrenals/Urinary Tract: There is thickening of the right adrenal gland without evidence of discrete nodule. The left adrenal gland is not well seen. Excreted contrast is noted in the kidneys bilaterally. There are bilateral renal cysts, the largest on the left measuring 10.9 cm. No hydronephrosis. Contrast is present in the urinary bladder and multiple bladder diverticula are noted. Stomach/Bowel: The stomach is nondistended. No bowel obstruction, free air or pneumatosis. A large amount of stool is present in the colon and rectum. The rectum is markedly distended measuring up to 10.2 cm in diameter. The appendix is not visualized on exam due to paucity of intra-abdominal fat. There is limited evaluation for bowel wall thickening. Vascular/Lymphatic: There is heavy atherosclerotic calcification of the aorta without evidence of aneurysm. No abdominal or pelvic lymphadenopathy. Reproductive: The prostate gland is normal in size and contains coarse calcifications. Other: No free fluid. Musculoskeletal: There is diffusely decreased mineralization of the bones. Compression deformities are present at T9 slightly retropulsed element resulting in no significant spinal canal stenosis. There is an acute compression fracture in the superior endplate of L1 with loss of vertebral body height of approximately 75% in slightly retropulsed element with mild spinal canal stenosis. There is an acute comminuted compression fracture at L2 with a retropulsed element and loss of vertebral body height of approximately 70% centrally, resulting in mild spinal canal stenosis. There is a compression fracture in the superior endplate at L4 with no significant retropulsed element. IMPRESSION: 1. Acute comminuted compression fracture at L2 with loss of vertebral body height of 70% centrally. There is a retropulsed element resulting in mild spinal  canal stenosis. 2. Compression fracture in the superior endplate at L1 which appears acute with loss of vertebral body height centrally of approximately 75%. There is a slight retropulsed element resulting in mild spinal canal stenosis. 3. Compression deformities at T9 and L4, indeterminate in age. 4. Large amount of stool in the colon and rectum. The rectum is markedly distended suggesting possibility of fecal impaction. 5. Small right pleural effusion and moderate left pleural effusion with atelectasis or infiltrate at the lung bases. 6. Bilateral renal cysts. 7. Multiple scattered bladder diverticula. Electronically Signed   By: Thornell Sartorius M.D.   On: 09/02/2021 03:38   DG Chest 1 View  Result Date: 09/16/2021 CLINICAL DATA:  Fall, pain. EXAM: CHEST  1 VIEW COMPARISON:  Chest x-ray 05/06/2020. FINDINGS: There are atherosclerotic calcifications throughout the aorta. Cardiac silhouette is within normal limits. There are bands of atelectasis in the right lung base and left mid lung. There are patchy opacities in the left lung base with small left pleural effusion. There is no pneumothorax. No acute fractures are seen. IMPRESSION: 1. Small left pleural effusion with left basilar atelectasis/airspace disease. Electronically Signed   By: Darliss Cheney M.D.   On: 09/15/2021 20:00   DG Pelvis 1-2 Views  Result Date: 09/07/2021 CLINICAL DATA:  Fall EXAM: PELVIS - 1-2 VIEW COMPARISON:  Radiograph dated December 23rd 2011 FINDINGS: No definite displaced fracture. Evaluation is somewhat limited due to patient rotation. Limited evaluation of the bilateral iliac bones and sacrum due to a large amount of overlying bowel gas. Vascular calcifications. IMPRESSION: No definite displaced fracture. Exam is limited by patient rotation, osteopenia and large amount of of gas overlying the bilateral iliac bones and sacrum. Electronically Signed   By:  Allegra Lai M.D.   On: 08/31/2021 20:05   CT HEAD WO  CONTRAST  Result Date: 09/13/2021 CLINICAL DATA:  Head trauma, minor (Age >= 65y); Neck trauma (Age >= 65y) EXAM: CT HEAD WITHOUT CONTRAST CT CERVICAL SPINE WITHOUT CONTRAST TECHNIQUE: Multidetector CT imaging of the head and cervical spine was performed following the standard protocol without intravenous contrast. Multiplanar CT image reconstructions of the cervical spine were also generated. COMPARISON:  May 06, 2020 FINDINGS: CT HEAD FINDINGS Brain: No evidence of acute infarction, hemorrhage, hydrocephalus, extra-axial collection or mass lesion/mass effect. Global parenchymal volume loss, advanced for age. Encephalomalacia in the RIGHT ACA distribution. Encephalomalacia of the LEFT parietal lobe. Periventricular white matter hypodensities consistent with sequela of chronic microvascular ischemic disease. Vascular: Vascular calcifications. Skull: No acute fracture. Sinuses/Orbits: Revisualization of a soft tissue mass of the RIGHT mastoid with extension into the middle ear with progressive erosive changes of the ossicles. RIGHT mastoid effusion, similar in comparison to prior. Other: LEFT ear cerumen. CT CERVICAL SPINE FINDINGS Alignment: Normal. Skull base and vertebrae: There is new irregular erosive change along the superior margin of the LEFT facet of C5 with the loose sclerotic osseous fragment noted in the facet space (series 9, image 32). Mild erosive irregularity of the inferior aspect of the LEFT facet of C4 (series 8, image 44). Soft tissues and spinal canal: No prevertebral fluid or swelling. No visible canal hematoma. Disc levels:  Mild uncovertebral hypertrophy at C3-4. Upper chest: Paraseptal and centrilobular emphysema. Unchanged subcentimeter RIGHT thyroid nodule for which no specific follow-up is recommended. Other: Severe atherosclerotic calcifications of the carotid arteries. IMPRESSION: 1. New widening of the LEFT facet of C4-5 with apparent erosive changes along the facet. Differential  considerations include osteomyelitis, neoplasm, inflammatory process or sequela of trauma. Recommend further dedicated evaluation with contrasted enhanced MRI cervical spine. 2.  No acute intracranial abnormality. 3. 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 There has been progressive extension of the soft tissue mass to the ossicles. This could be further assessed with dedicated brain MRI. Electronically Signed   By: Meda Klinefelter M.D.   On: 08/24/2021 19:34   CT Angio Chest PE W and/or Wo Contrast  Result Date: 09/02/2021 CLINICAL DATA:  Hypoxia EXAM: CT ANGIOGRAPHY CHEST WITH CONTRAST TECHNIQUE: Multidetector CT imaging of the chest was performed using the standard protocol during bolus administration of intravenous contrast. Multiplanar CT image reconstructions and MIPs were obtained to evaluate the vascular anatomy. CONTRAST:  75mL OMNIPAQUE IOHEXOL 350 MG/ML SOLN COMPARISON:  Chest radiograph dated 08/21/2021 FINDINGS: Cardiovascular: Satisfactory opacification the bilateral pulmonary arteries to the segmental level. No evidence of pulmonary embolism. Although not tailored for evaluation of the thoracic aorta, there is no evidence thoracic aortic aneurysm or dissection. Atherosclerotic calcifications of the aortic arch. The heart is normal in size.  No pericardial effusion. Three vessel coronary atherosclerosis. Mediastinum/Nodes: No suspicious mediastinal lymphadenopathy. Visualized thyroid is notable for an 11 mm right thyroid nodule (series 5/image 27). Not clinically significant; no follow-up imaging recommended (ref: J Am Coll Radiol. 2015 Feb;12(2): 143-50). Lungs/Pleura: No suspicious pulmonary nodules. Moderate left and small right pleural effusions. No frank interstitial edema. Associated bilateral lower lobe atelectasis. No focal consolidation. No pneumothorax. Upper Abdomen: Visualized upper abdomen is notable for bilateral renal  cysts and vascular calcifications. Musculoskeletal: Mild superior endplate compression fracture deformity at T7. Moderate compression fracture deformity at T9 with minimal retropulsion. These are both age indeterminate. Review of the MIP  images confirms the above findings. IMPRESSION: No evidence of pulmonary embolism. Moderate left and small right pleural effusions. Associated bilateral lower lobe atelectasis. No frank interstitial edema. Mild superior endplate compression fracture deformity at T7. Moderate compression fracture deformity at T9 with mild retropulsion. These are both age indeterminate. Aortic Atherosclerosis (ICD10-I70.0). Electronically Signed   By: Charline Bills M.D.   On: 09/02/2021 01:49   CT CERVICAL SPINE WO CONTRAST  Result Date: 09/29/2021 CLINICAL DATA:  Head trauma, minor (Age >= 65y); Neck trauma (Age >= 65y) EXAM: CT HEAD WITHOUT CONTRAST CT CERVICAL SPINE WITHOUT CONTRAST TECHNIQUE: Multidetector CT imaging of the head and cervical spine was performed following the standard protocol without intravenous contrast. Multiplanar CT image reconstructions of the cervical spine were also generated. COMPARISON:  May 06, 2020 FINDINGS: CT HEAD FINDINGS Brain: No evidence of acute infarction, hemorrhage, hydrocephalus, extra-axial collection or mass lesion/mass effect. Global parenchymal volume loss, advanced for age. Encephalomalacia in the RIGHT ACA distribution. Encephalomalacia of the LEFT parietal lobe. Periventricular white matter hypodensities consistent with sequela of chronic microvascular ischemic disease. Vascular: Vascular calcifications. Skull: No acute fracture. Sinuses/Orbits: Revisualization of a soft tissue mass of the RIGHT mastoid with extension into the middle ear with progressive erosive changes of the ossicles. RIGHT mastoid effusion, similar in comparison to prior. Other: LEFT ear cerumen. CT CERVICAL SPINE FINDINGS Alignment: Normal. Skull base and vertebrae: There  is new irregular erosive change along the superior margin of the LEFT facet of C5 with the loose sclerotic osseous fragment noted in the facet space (series 9, image 32). Mild erosive irregularity of the inferior aspect of the LEFT facet of C4 (series 8, image 44). Soft tissues and spinal canal: No prevertebral fluid or swelling. No visible canal hematoma. Disc levels:  Mild uncovertebral hypertrophy at C3-4. Upper chest: Paraseptal and centrilobular emphysema. Unchanged subcentimeter RIGHT thyroid nodule for which no specific follow-up is recommended. Other: Severe atherosclerotic calcifications of the carotid arteries. IMPRESSION: 1. New widening of the LEFT facet of C4-5 with apparent erosive changes along the facet. Differential considerations include osteomyelitis, neoplasm, inflammatory process or sequela of trauma. Recommend further dedicated evaluation with contrasted enhanced MRI cervical spine. 2.  No acute intracranial abnormality. 3. 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 There has been progressive extension of the soft tissue mass to the ossicles. This could be further assessed with dedicated brain MRI. Electronically Signed   By: Meda Klinefelter M.D.   On: 09/29/2021 19:34   MR BRAIN WO CONTRAST  Result Date: 09/02/2021 CLINICAL DATA:  Initial evaluation for neuro deficit, stroke suspected./ EXAM: MRI HEAD WITHOUT CONTRAST TECHNIQUE: Multiplanar, multiecho pulse sequences of the brain and surrounding structures were obtained without intravenous contrast. COMPARISON:  Prior CT from 29-Sep-2021. FINDINGS: Brain: Diffuse prominence of the CSF containing spaces compatible with generalized cerebral atrophy, fairly advanced in nature. Scattered patchy T2/FLAIR hyperintensity involving the periventricular deep white matter both cerebral hemispheres most consistent with chronic small vessel ischemic disease, moderate in nature. Few scattered  remote lacunar infarcts present about the hemispheric cerebral white matter. Scattered areas of encephalomalacia and gliosis involving the parasagittal right frontal and parietal lobes, consistent with prior ischemic infarct. Additional remote infarct noted at the left parietal lobe. Few areas of associated chronic hemosiderin staining noted about these areas of ischemia. Associated wallerian degeneration with atrophy noted at the right cerebral peduncle/midbrain. No abnormal foci of restricted diffusion to suggest acute or subacute ischemia. Gray-white matter differentiation otherwise  maintained. No evidence for acute intracranial hemorrhage. No mass lesion, midline shift or mass effect. Diffuse ventricular prominence related to global parenchymal volume loss of hydrocephalus. No extra-axial fluid collection. Pituitary gland suprasellar region within normal limits. Midline structures intact. Vascular: Abnormal flow voids within both internal carotid arteries to the termini, consistent with previously identified occlusion. Major intracranial vascular flow voids are otherwise maintained. Skull and upper cervical spine: Craniocervical junction within normal limits. Bone marrow signal intensity normal. No scalp soft tissue abnormality. Sinuses/Orbits: Globes and orbital soft tissues within normal limits. Paranasal sinuses are largely clear. 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 Associated chronic appearing right mastoid effusion. Other: None. IMPRESSION: 1. No acute intracranial infarct or other abnormality. 2. 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 3. 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 4. Advanced cerebral atrophy with  moderate chronic microvascular ischemic disease. 5. 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 Electronically Signed   By: Rise Mu M.D.   On: 09/02/2021 03:16   MR CERVICAL SPINE WO CONTRAST  Result Date: 09/02/2021 CLINICAL DATA:  Initial evaluation for chronic neck pain, erosive changes at left C4-5 facet. EXAM: MRI CERVICAL SPINE WITHOUT CONTRAST TECHNIQUE: Multiplanar, multisequence MR imaging of the cervical spine was performed. No intravenous contrast was administered. COMPARISON:  Prior CT from 24-Sep-2021. FINDINGS: Alignment: Examination moderately to severely degraded by motion artifact. Exaggeration of the normal cervical lordosis. 2 mm retrolisthesis of C3 on C4, with trace anterolisthesis of C4 on C5. Findings presumably chronic and facet mediated. Vertebrae: Vertebral body height maintained without acute or chronic fracture. Bone marrow signal intensity within normal limits. No worrisome osseous lesions. Mild discogenic reactive endplate change present about the C4-5 interspace. Additionally, there is relatively mild edema about the left C4-5 facet, corresponding with abnormality on prior CT. Overall, appearance on MRI felt to be most consistent with degenerative facet arthritis. No significantly increased edema or surrounding inflammatory changes as is typically seen with septic arthritis. No other abnormal marrow edema. Cord: Signal intensity within the cervical spinal cord grossly within normal limits on this motion degraded exam. Posterior Fossa, vertebral arteries, paraspinal tissues: Craniocervical junction normal. Paraspinous and prevertebral soft tissues grossly within normal limits on this motion degraded exam. Normal flow voids seen within the vertebral arteries bilaterally. Disc levels: C2-C3: Negative interspace. Mild right-sided facet hypertrophy. No significant spinal stenosis. Foramina remain  patent. C3-C4: Trace retrolisthesis with mild degenerative intervertebral disc space narrowing. Broad posterior disc protrusion flattens and partially faces the ventral thecal sac, eccentric to the right. Superimposed facet and ligament flavum hypertrophy. Resultant moderate spinal stenosis with mild cord flattening, but no visible cord signal changes. Moderate right C4 foraminal stenosis. Left neural foramen appears grossly patent. C4-C5: Mild degenerative intervertebral disc space narrowing with diffuse disc bulge and bilateral uncovertebral spurring. Bilateral facet arthrosis with mild ligament flavum hypertrophy. Resultant moderate spinal stenosis with mild cord flattening, but no visible cord signal changes. Moderate right with mild left C5 foraminal narrowing. C5-C6: Mild degenerative intervertebral disc space narrowing with diffuse disc bulge and bilateral uncovertebral spurring. Broad posterior disc osteophyte flattens and partially faces the ventral thecal sac with resultant mild spinal stenosis. Severe right with moderate left C6 foraminal narrowing. C6-C7: Minimal annular disc  bulge. Advanced left with mild right facet arthrosis. No spinal stenosis. Foramina appear grossly patent. C7-T1:  Negative interspace.  No canal or foraminal stenosis. Visualized upper thoracic spine demonstrates no significant finding. IMPRESSION: 1. Motion degraded exam. 2. Mild edema about the left C4-5 facet, corresponding with abnormality on prior CT. Overall, appearance is felt to be most consistent with degenerative facet arthritis. No significantly increased edema or surrounding inflammatory changes as is typically seen with septic arthritis. Correlation with symptomatology and laboratory values recommended. 3. Multilevel cervical spondylosis with resultant mild to moderate spinal stenosis at C3-4 through C5-6 as above. Associated moderate right C4 and C5 foraminal stenosis, with severe right and moderate left C6 foraminal  narrowing. Electronically Signed   By: Rise Mu M.D.   On: 09/02/2021 03:28   DG Shoulder Left  Result Date: 09-06-2021 CLINICAL DATA:  Fall, left shoulder pain EXAM: LEFT SHOULDER - 2+ VIEW COMPARISON:  CT left shoulder dated 05/07/2020 FINDINGS: Left shoulder deformity related to prior impacted femoral neck fracture. No acute fracture is seen. Visualized soft tissues are within normal limits. Visualized left lung is notable for a suspected small left pleural effusion. IMPRESSION: Left shoulder deformity related to prior impractical femoral neck fracture. No acute fracture is seen. Suspected small left pleural effusion. Electronically Signed   By: Charline Bills M.D.   On: 06-Sep-2021 20:02

## 2021-09-02 NOTE — ED Notes (Signed)
Admit provider at bedside. Will hold off on enema until after pt gets MRI

## 2021-09-02 NOTE — ED Notes (Signed)
C-collar removed at this time.

## 2021-09-02 NOTE — Progress Notes (Signed)
Pharmacy Antibiotic Note  Alan [name] is a 75 y.o. male admitted on 08/26/2021 presenting from home after being found down, AMS, concern for sepsis.  Pharmacy has been consulted for vancomycin and cefepime dosing.  Plan: Vancomycin 1250 mg IV x 1, then 1000 mg IV q 24h (eAUC 419, Goal 400-550, SCr used 0.8) Cefepime 2g IV every 12 hours Monitor renal function, Cx and clinical progression to narrow Vancomycin levels as needed     Temp (24hrs), Avg:97.8 F (36.6 C), Min:97.8 F (36.6 C), Max:97.8 F (36.6 C)  Recent Labs  Lab 09/18/2021 1839 09/06/2021 1845 08/29/2021 2023  WBC 12.7*  --   --   CREATININE  --  0.50* 0.58*  LATICACIDVEN 1.1  --  1.1    CrCl cannot be calculated (Unknown ideal weight.).    No Known Allergies  Daylene Posey, PharmD Clinical Pharmacist ED Pharmacist Phone # [phone] 09/02/2021 1:29 AM

## 2021-09-02 NOTE — H&P (Signed)
History and Physical    Hilton Saephan ZOX:[id number] DOB: [birth year] DOA: 08/31/2021  PCP: Barbie Banner, MD  Patient coming from: Home  I have personally briefly reviewed patient's old medical records in [facility]  Chief Complaint: Fall, generalized weakness  HPI: Alan [name] is a 75 y.o. male with medical history significant of HTN, PTSD, schizophrenia, prior stroke.  Pt in to ED with generalized weakness.  Per nephew: Pt lives alone independently.  Nephew stops by daily to help with meds.  Yesterday pt found on bathroom floor.  Nephew helped him up.  Pt seemed to be at baseline.  Today pt again found on floor.  When nephew tried to help him to his feet, he was too weak to stand assisted.  EMS called at that point and pt brought to ED.  Nephew reports ongoing wt loss with decreased appetite.  Admitted in July last year for AMS and generalized weakness, found to have UTI and rhabdo.  Pt denies SOB, abd pain, leg pain, back pain (though per nephew he gets back pain when sitting up these past couple of days).   ED Course: No rhabdo nor UTI.  Does have new O2 requirement, COVID and flu neg.  CTA reveals B pleural effusions: Mod left and small R.  CT AP = constipation, also acute compression fractures of multiple lumbar vertebra.  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  MRI brain and C spine = Multiple old strokes, no acute strokes.  Also has edema around a facet in neck that's being read as probably just degenerative arthritis (not septic arthritis).  WBC 12.7k  Also has a decubitus on buttocks that looks like it has some cellulitis.  Old left shoulder fracture.  Got cefepime, vanc, and 2L IVF bolus in ED for possible sepsis.  TSH 10, T4 also high at 1.27   Review of Systems: All systems reviewed, as per HPI otherwise negative, with the caveat that patient is disoriented and seems to have a degree of dementia.  Past Medical History:   Diagnosis Date   Esophageal stricture    Hypertension    PTSD (post-traumatic stress disorder)    Stroke [facility])     Past Surgical History:  Procedure Laterality Date   MELANOMA EXCISION     ORIF FEMUR FRACTURE- LISS PLATE       reports that he has been smoking. He has been smoking an average of 1.5 packs per day. He has never used smokeless tobacco. He reports that he does not drink alcohol and does not use drugs.  No Known Allergies  Family History  Family history unknown: Yes     Prior to Admission medications   Medication Sig Start Date End Date Taking? Authorizing Provider  acetaminophen (TYLENOL) 500 MG tablet Take 500-1,000 mg by mouth every 6 (six) hours as needed for moderate pain or headache.   Yes [provider]  amLODipine (NORVASC) 5 MG tablet Take 5 mg by mouth in the morning and at bedtime.   Yes [provider]  carvedilol (COREG) 25 MG tablet Take 25 mg by mouth 2 (two) times daily. 06/26/21  Yes [provider]  Cholecalciferol (VITAMIN D3 PO) Take 1 tablet by mouth daily.   Yes [provider]  hydrochlorothiazide (MICROZIDE) 12.5 MG capsule Take 12.5 mg by mouth daily. 06/26/21  Yes [provider]  mirtazapine (REMERON) 15 MG tablet Take 15 mg by mouth every evening. 07/26/21  Yes [provider]  Multiple Vitamin (MULTIVITAMIN WITH MINERALS) TABS tablet Take 1 tablet by mouth daily. 05/10/20  Yes Regalado, Belkys A, MD  risperiDONE (RISPERDAL) 1 MG tablet Take 1 mg by mouth daily.   Yes [provider]  vitamin E 400 UNIT capsule Take 400 Units by mouth every morning.   Yes [provider]  tiotropium (SPIRIVA) 18 MCG inhalation capsule Place 1 capsule (18 mcg total) into inhaler and inhale daily. Patient not taking: No sig reported 02/20/12 02/19/13  Dorothea Ogle, MD    Physical Exam: Vitals:   09/02/21 0145 09/02/21 0318 09/02/21 0330 09/02/21 0348  BP: (!) 114/51 112/77 (!) 110/44    Pulse: 63 69 64   Resp: (!) 22 (!) 24 18   Temp:      TempSrc:      SpO2: 97% 95% 98%   Weight:    39.8 kg  Height:    5' 5.5" (1.664 m)    Constitutional: Cachectic and ill appearing Eyes: PERRL, lids and conjunctivae normal ENMT: Mucous membranes are moist. Posterior pharynx clear of any exudate or lesions.Normal dentition.  Neck: normal, supple, no masses, no thyromegaly Respiratory: Decreased BS over bases Cardiovascular: Regular rate and rhythm, no murmurs / rubs / gallops. No extremity edema. 2+ pedal pulses. No carotid bruits.  Abdomen: no tenderness, no masses palpated. No hepatosplenomegaly. Bowel sounds positive.  Musculoskeletal: Decreased ROM L shoulder Skin: Pressure ulcer L posterior hip with scabbing and surrounding erythema. Neurologic: LLE weakness and numbness that is chronic and baseline per family (has an ankle brace he wears for foot drop).  Still had 4/5 muscle strength in LLE, 5/5 in RLE.  Also seems baseline per family. Psychiatric: Disoriented, only slightly more confused than baseline though per family.   Labs on Admission: I have personally reviewed following labs and imaging studies  CBC: Recent Labs  Lab 09/10/2021 1839 09/09/2021 1845  WBC 12.7*  --   NEUTROABS 11.2*  --   HGB 11.5* 11.2*  HCT 34.2* 33.0*  MCV 90.7  --   PLT 192  --    Basic Metabolic Panel: Recent Labs  Lab 09/12/2021 1845 09/09/2021 2023  NA 135 135  K 3.5 3.3*  CL 93* 94*  CO2  --  32  GLUCOSE 114* 119*  BUN 34* 24*  CREATININE 0.50* 0.58*  CALCIUM  --  8.5*  MG  --  1.7   GFR: Estimated Creatinine Clearance: 43.5 mL/min (A) (by C-G formula based on SCr of 0.58 mg/dL (L)). Liver Function Tests: Recent Labs  Lab 08/22/2021 2023  AST 29  ALT 21  ALKPHOS 145*  BILITOT 0.6  PROT 5.4*  ALBUMIN 2.7*   No results for input(s): LIPASE, AMYLASE in the last 168 hours. No results for input(s): AMMONIA in the last 168 hours. Coagulation Profile: Recent Labs  Lab  08/24/2021 1839 09/16/2021 2023  INR 1.1 1.1   Cardiac Enzymes: Recent Labs  Lab 09/04/2021 2023  CKTOTAL 190   BNP (last 3 results) No results for input(s): PROBNP in the last 8760 hours. HbA1C: No results for input(s): HGBA1C in the last 72 hours. CBG: No results for input(s): GLUCAP in the last 168 hours. Lipid Profile: No results for input(s): CHOL, HDL, LDLCALC, TRIG, CHOLHDL, LDLDIRECT in the last 72 hours. Thyroid Function Tests: Recent Labs    09/19/2021 1839  TSH 10.634*  FREET4 1.27*   Anemia Panel: No results for input(s): VITAMINB12, FOLATE, FERRITIN, TIBC, IRON, RETICCTPCT in the last 72 hours. Urine analysis:  Component Value Date/Time   COLORURINE YELLOW 08/26/2021 1839   APPEARANCEUR CLOUDY (A) 08/26/2021 1839   LABSPEC 1.015 09/19/2021 1839   PHURINE 7.0 09/13/2021 1839   GLUCOSEU NEGATIVE 09/05/2021 1839   HGBUR NEGATIVE 09/06/2021 1839   BILIRUBINUR NEGATIVE 09/10/2021 1839   KETONESUR NEGATIVE 08/31/2021 1839   PROTEINUR NEGATIVE 09/02/2021 1839   UROBILINOGEN 0.2 02/16/2012 1232   NITRITE NEGATIVE 08/24/2021 1839   LEUKOCYTESUR NEGATIVE 08/26/2021 1839    Radiological Exams on Admission: CT ABDOMEN PELVIS WO CONTRAST  Result Date: 09/02/2021 CLINICAL DATA:  Abdominal trauma. EXAM: CT ABDOMEN AND PELVIS WITHOUT CONTRAST TECHNIQUE: Multidetector CT imaging of the abdomen and pelvis was performed following the standard protocol without IV contrast. COMPARISON:  09/02/2021 FINDINGS: Lower chest: The heart is enlarged and multi-vessel coronary artery calcifications are noted. There is a moderate left pleural effusion and a small right pleural effusion with patchy atelectasis or infiltrate at the lung bases. Hepatobiliary: A few scattered hypodensities are present in the liver, most likely representing cysts or hemangioma is. No biliary ductal dilatation. The gallbladder is not visualized on exam. Pancreas: Within normal limits on noncontrast exam. Spleen:  Normal in size without focal abnormality. Adrenals/Urinary Tract: There is thickening of the right adrenal gland without evidence of discrete nodule. The left adrenal gland is not well seen. Excreted contrast is noted in the kidneys bilaterally. There are bilateral renal cysts, the largest on the left measuring 10.9 cm. No hydronephrosis. Contrast is present in the urinary bladder and multiple bladder diverticula are noted. Stomach/Bowel: The stomach is nondistended. No bowel obstruction, free air or pneumatosis. A large amount of stool is present in the colon and rectum. The rectum is markedly distended measuring up to 10.2 cm in diameter. The appendix is not visualized on exam due to paucity of intra-abdominal fat. There is limited evaluation for bowel wall thickening. Vascular/Lymphatic: There is heavy atherosclerotic calcification of the aorta without evidence of aneurysm. No abdominal or pelvic lymphadenopathy. Reproductive: The prostate gland is normal in size and contains coarse calcifications. Other: No free fluid. Musculoskeletal: There is diffusely decreased mineralization of the bones. Compression deformities are present at T9 slightly retropulsed element resulting in no significant spinal canal stenosis. There is an acute compression fracture in the superior endplate of L1 with loss of vertebral body height of approximately 75% in slightly retropulsed element with mild spinal canal stenosis. There is an acute comminuted compression fracture at L2 with a retropulsed element and loss of vertebral body height of approximately 70% centrally, resulting in mild spinal canal stenosis. There is a compression fracture in the superior endplate at L4 with no significant retropulsed element. IMPRESSION: 1. Acute comminuted compression fracture at L2 with loss of vertebral body height of 70% centrally. There is a retropulsed element resulting in mild spinal canal stenosis. 2. Compression fracture in the superior  endplate at L1 which appears acute with loss of vertebral body height centrally of approximately 75%. There is a slight retropulsed element resulting in mild spinal canal stenosis. 3. Compression deformities at T9 and L4, indeterminate in age. 4. Large amount of stool in the colon and rectum. The rectum is markedly distended suggesting possibility of fecal impaction. 5. Small right pleural effusion and moderate left pleural effusion with atelectasis or infiltrate at the lung bases. 6. Bilateral renal cysts. 7. Multiple scattered bladder diverticula. Electronically Signed   By: Thornell Sartorius M.D.   On: 09/02/2021 03:38   DG Chest 1 View  Result Date: 09/03/2021 CLINICAL DATA:  Fall, pain. EXAM: CHEST  1 VIEW COMPARISON:  Chest x-ray 05/06/2020. FINDINGS: There are atherosclerotic calcifications throughout the aorta. Cardiac silhouette is within normal limits. There are bands of atelectasis in the right lung base and left mid lung. There are patchy opacities in the left lung base with small left pleural effusion. There is no pneumothorax. No acute fractures are seen. IMPRESSION: 1. Small left pleural effusion with left basilar atelectasis/airspace disease. Electronically Signed   By: Darliss Cheney M.D.   On: 09/09/2021 20:00   DG Pelvis 1-2 Views  Result Date: 08/31/2021 CLINICAL DATA:  Fall EXAM: PELVIS - 1-2 VIEW COMPARISON:  Radiograph dated December 23rd 2011 FINDINGS: No definite displaced fracture. Evaluation is somewhat limited due to patient rotation. Limited evaluation of the bilateral iliac bones and sacrum due to a large amount of overlying bowel gas. Vascular calcifications. IMPRESSION: No definite displaced fracture. Exam is limited by patient rotation, osteopenia and large amount of of gas overlying the bilateral iliac bones and sacrum. Electronically Signed   By: Allegra Lai M.D.   On: 09/08/2021 20:05   CT HEAD WO CONTRAST  Result Date: 09/18/2021 CLINICAL DATA:  Head trauma, minor  (Age >= 65y); Neck trauma (Age >= 65y) EXAM: CT HEAD WITHOUT CONTRAST CT CERVICAL SPINE WITHOUT CONTRAST TECHNIQUE: Multidetector CT imaging of the head and cervical spine was performed following the standard protocol without intravenous contrast. Multiplanar CT image reconstructions of the cervical spine were also generated. COMPARISON:  May 06, 2020 FINDINGS: CT HEAD FINDINGS Brain: No evidence of acute infarction, hemorrhage, hydrocephalus, extra-axial collection or mass lesion/mass effect. Global parenchymal volume loss, advanced for age. Encephalomalacia in the RIGHT ACA distribution. Encephalomalacia of the LEFT parietal lobe. Periventricular white matter hypodensities consistent with sequela of chronic microvascular ischemic disease. Vascular: Vascular calcifications. Skull: No acute fracture. Sinuses/Orbits: Revisualization of a soft tissue mass of the RIGHT mastoid with extension into the middle ear with progressive erosive changes of the ossicles. RIGHT mastoid effusion, similar in comparison to prior. Other: LEFT ear cerumen. CT CERVICAL SPINE FINDINGS Alignment: Normal. Skull base and vertebrae: There is new irregular erosive change along the superior margin of the LEFT facet of C5 with the loose sclerotic osseous fragment noted in the facet space (series 9, image 32). Mild erosive irregularity of the inferior aspect of the LEFT facet of C4 (series 8, image 44). Soft tissues and spinal canal: No prevertebral fluid or swelling. No visible canal hematoma. Disc levels:  Mild uncovertebral hypertrophy at C3-4. Upper chest: Paraseptal and centrilobular emphysema. Unchanged subcentimeter RIGHT thyroid nodule for which no specific follow-up is recommended. Other: Severe atherosclerotic calcifications of the carotid arteries. IMPRESSION: 1. New widening of the LEFT facet of C4-5 with apparent erosive changes along the facet. Differential considerations include osteomyelitis, neoplasm, inflammatory process or  sequela of trauma. Recommend further dedicated evaluation with contrasted enhanced MRI cervical spine. 2.  No acute intracranial abnormality. 3. 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 There has been progressive extension of the soft tissue mass to the ossicles. This could be further assessed with dedicated brain MRI. Electronically Signed   By: Meda Klinefelter M.D.   On: 09/04/2021 19:34   CT Angio Chest PE W and/or Wo Contrast  Result Date: 09/02/2021 CLINICAL DATA:  Hypoxia EXAM: CT ANGIOGRAPHY CHEST WITH CONTRAST TECHNIQUE: Multidetector CT imaging of the chest was performed using the standard protocol during bolus administration of intravenous contrast. Multiplanar CT image reconstructions and MIPs were  obtained to evaluate the vascular anatomy. CONTRAST:  7mL OMNIPAQUE IOHEXOL 350 MG/ML SOLN COMPARISON:  Chest radiograph dated 09/20/2021 FINDINGS: Cardiovascular: Satisfactory opacification the bilateral pulmonary arteries to the segmental level. No evidence of pulmonary embolism. Although not tailored for evaluation of the thoracic aorta, there is no evidence thoracic aortic aneurysm or dissection. Atherosclerotic calcifications of the aortic arch. The heart is normal in size.  No pericardial effusion. Three vessel coronary atherosclerosis. Mediastinum/Nodes: No suspicious mediastinal lymphadenopathy. Visualized thyroid is notable for an 11 mm right thyroid nodule (series 5/image 27). Not clinically significant; no follow-up imaging recommended (ref: J Am Coll Radiol. 2015 Feb;12(2): 143-50). Lungs/Pleura: No suspicious pulmonary nodules. Moderate left and small right pleural effusions. No frank interstitial edema. Associated bilateral lower lobe atelectasis. No focal consolidation. No pneumothorax. Upper Abdomen: Visualized upper abdomen is notable for bilateral renal cysts and vascular calcifications. Musculoskeletal: Mild superior endplate  compression fracture deformity at T7. Moderate compression fracture deformity at T9 with minimal retropulsion. These are both age indeterminate. Review of the MIP images confirms the above findings. IMPRESSION: No evidence of pulmonary embolism. Moderate left and small right pleural effusions. Associated bilateral lower lobe atelectasis. No frank interstitial edema. Mild superior endplate compression fracture deformity at T7. Moderate compression fracture deformity at T9 with mild retropulsion. These are both age indeterminate. Aortic Atherosclerosis (ICD10-I70.0). Electronically Signed   By: Charline Bills M.D.   On: 09/02/2021 01:49   CT CERVICAL SPINE WO CONTRAST  Result Date: 09/20/21 CLINICAL DATA:  Head trauma, minor (Age >= 65y); Neck trauma (Age >= 65y) EXAM: CT HEAD WITHOUT CONTRAST CT CERVICAL SPINE WITHOUT CONTRAST TECHNIQUE: Multidetector CT imaging of the head and cervical spine was performed following the standard protocol without intravenous contrast. Multiplanar CT image reconstructions of the cervical spine were also generated. COMPARISON:  May 06, 2020 FINDINGS: CT HEAD FINDINGS Brain: No evidence of acute infarction, hemorrhage, hydrocephalus, extra-axial collection or mass lesion/mass effect. Global parenchymal volume loss, advanced for age. Encephalomalacia in the RIGHT ACA distribution. Encephalomalacia of the LEFT parietal lobe. Periventricular white matter hypodensities consistent with sequela of chronic microvascular ischemic disease. Vascular: Vascular calcifications. Skull: No acute fracture. Sinuses/Orbits: Revisualization of a soft tissue mass of the RIGHT mastoid with extension into the middle ear with progressive erosive changes of the ossicles. RIGHT mastoid effusion, similar in comparison to prior. Other: LEFT ear cerumen. CT CERVICAL SPINE FINDINGS Alignment: Normal. Skull base and vertebrae: There is new irregular erosive change along the superior margin of the LEFT  facet of C5 with the loose sclerotic osseous fragment noted in the facet space (series 9, image 32). Mild erosive irregularity of the inferior aspect of the LEFT facet of C4 (series 8, image 44). Soft tissues and spinal canal: No prevertebral fluid or swelling. No visible canal hematoma. Disc levels:  Mild uncovertebral hypertrophy at C3-4. Upper chest: Paraseptal and centrilobular emphysema. Unchanged subcentimeter RIGHT thyroid nodule for which no specific follow-up is recommended. Other: Severe atherosclerotic calcifications of the carotid arteries. IMPRESSION: 1. New widening of the LEFT facet of C4-5 with apparent erosive changes along the facet. Differential considerations include osteomyelitis, neoplasm, inflammatory process or sequela of trauma. Recommend further dedicated evaluation with contrasted enhanced MRI cervical spine. 2.  No acute intracranial abnormality. 3. 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 There has been progressive extension of the soft tissue mass to the ossicles. This could be further assessed with dedicated brain MRI. Electronically Signed   By: Judeth Cornfield  Peacock M.D.   On: September 18, 2021 19:34   MR BRAIN WO CONTRAST  Result Date: 09/02/2021 CLINICAL DATA:  Initial evaluation for neuro deficit, stroke suspected./ EXAM: MRI HEAD WITHOUT CONTRAST TECHNIQUE: Multiplanar, multiecho pulse sequences of the brain and surrounding structures were obtained without intravenous contrast. COMPARISON:  Prior CT from Sep 18, 2021. FINDINGS: Brain: Diffuse prominence of the CSF containing spaces compatible with generalized cerebral atrophy, fairly advanced in nature. Scattered patchy T2/FLAIR hyperintensity involving the periventricular deep white matter both cerebral hemispheres most consistent with chronic small vessel ischemic disease, moderate in nature. Few scattered remote lacunar infarcts present about the hemispheric cerebral white  matter. Scattered areas of encephalomalacia and gliosis involving the parasagittal right frontal and parietal lobes, consistent with prior ischemic infarct. Additional remote infarct noted at the left parietal lobe. Few areas of associated chronic hemosiderin staining noted about these areas of ischemia. Associated wallerian degeneration with atrophy noted at the right cerebral peduncle/midbrain. No abnormal foci of restricted diffusion to suggest acute or subacute ischemia. Gray-white matter differentiation otherwise maintained. No evidence for acute intracranial hemorrhage. No mass lesion, midline shift or mass effect. Diffuse ventricular prominence related to global parenchymal volume loss of hydrocephalus. No extra-axial fluid collection. Pituitary gland suprasellar region within normal limits. Midline structures intact. Vascular: Abnormal flow voids within both internal carotid arteries to the termini, consistent with previously identified occlusion. Major intracranial vascular flow voids are otherwise maintained. Skull and upper cervical spine: Craniocervical junction within normal limits. Bone marrow signal intensity normal. No scalp soft tissue abnormality. Sinuses/Orbits: Globes and orbital soft tissues within normal limits. Paranasal sinuses are largely clear. 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 Associated chronic appearing right mastoid effusion. Other: None. IMPRESSION: 1. No acute intracranial infarct or other abnormality. 2. 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 3. 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 4. Advanced cerebral atrophy with moderate chronic microvascular ischemic disease. 5. 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 Electronically Signed   By: Rise Mu M.D.   On: 09/02/2021 03:16   MR CERVICAL SPINE WO CONTRAST  Result Date: 09/02/2021 CLINICAL DATA:  Initial evaluation for chronic neck pain, erosive changes at left C4-5 facet. EXAM: MRI CERVICAL SPINE WITHOUT CONTRAST TECHNIQUE: Multiplanar, multisequence MR imaging of the cervical spine was performed. No intravenous contrast was administered. COMPARISON:  Prior CT from Sep 18, 2021. FINDINGS: Alignment: Examination moderately to severely degraded by motion artifact. Exaggeration of the normal cervical lordosis. 2 mm retrolisthesis of C3 on C4, with trace anterolisthesis of C4 on C5. Findings presumably chronic and facet mediated. Vertebrae: Vertebral body height maintained without acute or chronic fracture. Bone marrow signal intensity within normal limits. No worrisome osseous lesions. Mild discogenic reactive endplate change present about the C4-5 interspace. Additionally, there is relatively mild edema about the left C4-5 facet, corresponding with abnormality on prior CT. Overall, appearance on MRI felt to be most consistent with degenerative facet arthritis. No significantly increased edema or surrounding inflammatory changes as is typically seen with septic arthritis. No other abnormal marrow edema. Cord: Signal intensity within the cervical spinal cord grossly within normal limits on this motion degraded exam. Posterior Fossa, vertebral arteries, paraspinal tissues: Craniocervical junction normal. Paraspinous and prevertebral soft tissues grossly within normal limits  on this motion degraded exam. Normal flow voids seen within the vertebral arteries bilaterally. Disc levels: C2-C3: Negative interspace. Mild right-sided facet hypertrophy. No significant spinal stenosis. Foramina remain patent. C3-C4: Trace retrolisthesis with mild degenerative intervertebral disc  space narrowing. Broad posterior disc protrusion flattens and partially faces the ventral thecal sac, eccentric to the right. Superimposed facet and ligament flavum hypertrophy. Resultant moderate spinal stenosis with mild cord flattening, but no visible cord signal changes. Moderate right C4 foraminal stenosis. Left neural foramen appears grossly patent. C4-C5: Mild degenerative intervertebral disc space narrowing with diffuse disc bulge and bilateral uncovertebral spurring. Bilateral facet arthrosis with mild ligament flavum hypertrophy. Resultant moderate spinal stenosis with mild cord flattening, but no visible cord signal changes. Moderate right with mild left C5 foraminal narrowing. C5-C6: Mild degenerative intervertebral disc space narrowing with diffuse disc bulge and bilateral uncovertebral spurring. Broad posterior disc osteophyte flattens and partially faces the ventral thecal sac with resultant mild spinal stenosis. Severe right with moderate left C6 foraminal narrowing. C6-C7: Minimal annular disc bulge. Advanced left with mild right facet arthrosis. No spinal stenosis. Foramina appear grossly patent. C7-T1:  Negative interspace.  No canal or foraminal stenosis. Visualized upper thoracic spine demonstrates no significant finding. IMPRESSION: 1. Motion degraded exam. 2. Mild edema about the left C4-5 facet, corresponding with abnormality on prior CT. Overall, appearance is felt to be most consistent with degenerative facet arthritis. No significantly increased edema or surrounding inflammatory changes as is typically seen with septic arthritis. Correlation with symptomatology and laboratory values recommended. 3. Multilevel cervical spondylosis with resultant mild to moderate spinal stenosis at C3-4 through C5-6 as above. Associated moderate right C4 and C5 foraminal stenosis, with severe right and moderate left C6 foraminal narrowing. Electronically Signed   By: Rise Mu M.D.   On:  09/02/2021 03:28   DG Shoulder Left  Result Date: Sep 30, 2021 CLINICAL DATA:  Fall, left shoulder pain EXAM: LEFT SHOULDER - 2+ VIEW COMPARISON:  CT left shoulder dated 05/07/2020 FINDINGS: Left shoulder deformity related to prior impacted femoral neck fracture. No acute fracture is seen. Visualized soft tissues are within normal limits. Visualized left lung is notable for a suspected small left pleural effusion. IMPRESSION: Left shoulder deformity related to prior impractical femoral neck fracture. No acute fracture is seen. Suspected small left pleural effusion. Electronically Signed   By: Charline Bills M.D.   On: 09-30-21 20:02    EKG: Independently reviewed.  Assessment/Plan Principal Problem:   Hypoxia Active Problems:   Generalized weakness   HTN (hypertension)   Severe protein-calorie malnutrition (HCC)   Schizophrenia (HCC)   Pressure injury of skin   Pleural effusion   Compression fracture of lumbar vertebra, closed, initial encounter (HCC)   Constipation    Hypoxia - Only acute finding at this point is the B pleural effusions and atelectasis O2 via [place] Cont pulse ox Possibly consider IR drainage Compression fractures of multiple lumbar vertebra - Mild canal stenosis Dr. Franky Macho consulted: Not convinced acute compression fractures based just on CT scan Wants MRI of T and L spine Will see in AM Okay to roll in bed Bed rest ordered for now Generalized weakness - multifactorial PT/OT SW consult Altered mental status - Fairly severe But apparently not far off from baseline based on nephew's report, so fairly chronic Suspect underlying vascular dementia is the primary driver here Severe protein calorie malnutrition - Chronic Continue Remeron Dietary consult Suspect a significant degree of adult failure to thrive from his vascular dementia from  family's description. Schizophrenia Cont risperidone Constipation / impaction - Milk and molasses enema  X1 Dulcolax PRN Scheduled Colace HTN - BP soft in ED today Will hold home BP meds for the moment ? Of Sepsis - Does have cellulitis surrounding wound on buttocks No other obvious source BCx pending Check procalcitonin Will de-escalate ABx to rocephin for the moment Check MRSA PCR nares ? Hyperthyroidism - Mild with T4 just above the upper limit of normal at 1.27 Certainly shouldn't be causing thyroid storm at this level. TSH also elevated Resume coreg when BP permits  DVT prophylaxis: Lovenox Code Status: Full Code for now, may wish to get pal care involved during stay to discuss goals of care Family Communication: Nephew at bedside Disposition Plan: TBD Consults called: IR to see later today Admission status: Place in obs   Domique Reardon M. DO [facility]  How to contact the [facility] Attending or Consulting provider 7A - 7P or covering provider during after hours 7P -7A, for this patient?  Check the care team in [facility] and look for a) attending/consulting [facility] provider listed and b) the [facility] team listed Log into [email or web address]  Amion Physician Scheduling and messaging for groups and whole hospitals  On call and physician scheduling software for group practices, residents, hospitalists and other medical providers for call, clinic, rotation and shift schedules. On[employer] is a hospital-wide system for scheduling doctors and paging doctors on call. EasyPlot is for scientific plotting and data analysis.  [email or web address]  and use [place]'s universal password to access. If you do not have the password, please contact the hospital operator.  Locate the [facility] provider you are looking for under [facility] and page to a number that you can be directly reached. If you still have difficulty reaching the provider, please page the [facility] (Director on Call) for the Hospitalists listed on amion for assistance.  09/02/2021, 3:58 AM

## 2021-09-02 NOTE — Progress Notes (Signed)
     Referral received for Alan [name] :goals of care discussion. Chart reviewed and updates received from RN. Patient initially off the floor at MRI during first visit, later returned assessed and is unable to engage appropriately in discussions. Patient is cachectic, unable to comprehend speech, appears confused. Attempted to contact patient's next of kin Alan [name]-nephew) x2. Unable to reach. Voicemail left with contact information given.   PMT will re-attempt to contact family at a later time/date. Detailed note and recommendations to follow once GOC has been completed.   Thank you for your referral and allowing [facility] to assist in [name] care.   Willette Alma, AGPCNP-BC Palliative Medicine Team  Phone: [phone]  NO CHARGE

## 2021-09-02 NOTE — ED Notes (Signed)
Received verbal report from Bethany S RN at this time 

## 2021-09-02 NOTE — Progress Notes (Deleted)
Patient ID: Alan [name], male   DOB: Dec 27, 1945, 75 y.o.   MRN: [id number] Patient currently is not in his room. Have ordered a brace. He is not an operative candidate.  Will have to leave consult later.

## 2021-09-02 NOTE — ED Notes (Signed)
Spoke with provider in reference to pt receiving enema. Advised at this time c-collar can be removed. That an order will be placed for TLSO. And to admin the enema

## 2021-09-02 NOTE — Consult Note (Signed)
WOC Nurse Consult Note: Reason for Consult:Unstageable pressure injury to left hip Wound type:Pressure vs trauma Pressure Injury POA: Yes Measurement: 4cm x 2cm. Depth is unable to be determined due to presence of nonviable tissue Wound bed: As noted above Drainage (amount, consistency, odor) None, dry. Periwound: Intact, dry. Dressing procedure/placement/frequency: Patient is frail and is s/p fall. We will today add a mattress replacement, bilateral pressure redistribution heel boots and topical care using an enzymatic debridement using collagenase (Santyl). Turning and repositioning is in place. A pressure redistribution chair pad is provided for use when OOB to the chair.  Once nonviable tissue is removed from wound bed and a healthy, pink and moist wound is evident, a change in wound topical therapy is indicated. Consider re consulting or implementing a POC that includes a calcium alginate or silver hydrofiber wound dressing. Consider also a referral to an outpatient wound care center of the patient's choosing once discharged.  Recommend a nutritional consult.  If you agree, please order/arrange.  WOC nursing team will not follow, but will remain available to this patient, the nursing and medical teams.  Please re-consult if needed. Thanks, Ladona Mow, MSN, RN, GNP, [place]  Pager# [phone]

## 2021-09-02 NOTE — Consult Note (Signed)
Reason for Consult:compression fractures, thoracic, lumbar Referring Physician: Zacherie, Alan [name] is an 75 y.o. male.  HPI: whom has fallen multiple times in the last few days. Found down in his residence, large decubitus, cachectic, dehydrated. Noted to have multiple thoracic and lumbar compression fractures. Asked to evaluate. Also history of schizophrenia, ptsd. Cervical spine shows degenerative changes. The compression fractures are subacute.  Past Medical History:  Diagnosis Date   Esophageal stricture    Hypertension    PTSD (post-traumatic stress disorder)    Stroke [facility])     Past Surgical History:  Procedure Laterality Date   MELANOMA EXCISION     ORIF FEMUR FRACTURE- LISS PLATE      Family History  Family history unknown: Yes    Social History:  reports that he has been smoking cigarettes. He has been smoking an average of 1.5 packs per day. He has never used smokeless tobacco. He reports that he does not drink alcohol and does not use drugs.  Allergies: No Known Allergies  Medications: I have reviewed the patient's current medications.  Results for orders placed or performed during the hospital encounter of 09/07/21 (from the past 48 hour(s))  Resp Panel by RT-PCR (Flu A&B, Covid) Nasopharyngeal Swab     Status: None   Collection Time: 09-07-21  6:39 PM   Specimen: Nasopharyngeal Swab; Nasopharyngeal(NP) swabs in vial transport medium  Result Value Ref Range   SARS Coronavirus 2 by RT PCR NEGATIVE NEGATIVE    Comment: (NOTE) SARS-CoV-2 target nucleic acids are NOT DETECTED.  The SARS-CoV-2 RNA is generally detectable in upper respiratory specimens during the acute phase of infection. The lowest concentration of SARS-CoV-2 viral copies this assay can detect is 138 copies/mL. A negative result does not preclude SARS-Cov-2 infection and should not be used as the sole basis for treatment or other patient management decisions. A negative result  may occur with  improper specimen collection/handling, submission of specimen other than nasopharyngeal swab, presence of viral mutation(s) within the areas targeted by this assay, and inadequate number of viral copies(<138 copies/mL). A negative result must be combined with clinical observations, patient history, and epidemiological information. The expected result is Negative.  Fact Sheet for Patients:  BloggerCourse.com  Fact Sheet for Healthcare Providers:  SeriousBroker.it  This test is no t yet approved or cleared by the [place] FDA and  has been authorized for detection and/or diagnosis of SARS-CoV-2 by FDA under an Emergency Use Authorization (EUA). This EUA will remain  in effect (meaning this test can be used) for the duration of the COVID-19 declaration under Section 564(b)(1) of the Act, 21 U.S.C.section 360bbb-3(b)(1), unless the authorization is terminated  or revoked sooner.       Influenza A by PCR NEGATIVE NEGATIVE   Influenza B by PCR NEGATIVE NEGATIVE    Comment: (NOTE) The Xpert Xpress SARS-CoV-2/FLU/RSV plus assay is intended as an aid in the diagnosis of influenza from Nasopharyngeal swab specimens and should not be used as a sole basis for treatment. Nasal washings and aspirates are unacceptable for Xpert Xpress SARS-CoV-2/FLU/RSV testing.  Fact Sheet for Patients: BloggerCourse.com  Fact Sheet for Healthcare Providers: SeriousBroker.it  This test is not yet approved or cleared by the [place] FDA and has been authorized for detection and/or diagnosis of SARS-CoV-2 by FDA under an Emergency Use Authorization (EUA). This EUA will remain in effect (meaning this test can be used) for the duration of the COVID-19 declaration under Section 564(b)(1) of the  Act, 21 U.S.C. section 360bbb-3(b)(1), unless the authorization is terminated  or revoked.  Performed at [facility], [address] [place], [place] [address]   Urinalysis, Routine w reflex microscopic     Status: Abnormal   Collection Time: 08/22/2021  6:39 PM  Result Value Ref Range   Color, Urine YELLOW YELLOW   APPearance CLOUDY (A) CLEAR   Specific Gravity, Urine 1.015 1.005 - 1.030   pH 7.0 5.0 - 8.0   Glucose, UA NEGATIVE NEGATIVE mg/dL   Hgb urine dipstick NEGATIVE NEGATIVE   Bilirubin Urine NEGATIVE NEGATIVE   Ketones, ur NEGATIVE NEGATIVE mg/dL   Protein, ur NEGATIVE NEGATIVE mg/dL   Nitrite NEGATIVE NEGATIVE   Leukocytes,Ua NEGATIVE NEGATIVE    Comment: Performed at [facility], [address] [place], [place] [address]  Lactic acid, plasma     Status: None   Collection Time: 08/28/2021  6:39 PM  Result Value Ref Range   Lactic Acid, Venous 1.1 0.5 - 1.9 mmol/L    Comment: Performed at [facility], [address], [place], [place] [address]  Protime-INR     Status: None   Collection Time: 09/03/2021  6:39 PM  Result Value Ref Range   Prothrombin Time 13.8 11.4 - 15.2 seconds   INR 1.1 0.8 - 1.2    Comment: (NOTE) INR goal varies based on device and disease states. Performed at [facility], [address] [place], [place] [address]   CBC WITH DIFFERENTIAL     Status: Abnormal   Collection Time: 08/24/2021  6:39 PM  Result Value Ref Range   WBC 12.7 (H) 4.0 - 10.5 K/uL   RBC 3.77 (L) 4.22 - 5.81 MIL/uL   Hemoglobin 11.5 (L) 13.0 - 17.0 g/dL   HCT 58.5 (L) 92.9 - 24.4 %   MCV 90.7 80.0 - 100.0 fL   MCH 30.5 26.0 - 34.0 pg   MCHC 33.6 30.0 - 36.0 g/dL   RDW 62.8 63.8 - 17.7 %   Platelets 192 150 - 400 K/uL   nRBC 0.0 0.0 - 0.2 %   Neutrophils Relative % 89 %   Neutro Abs 11.2 (H) 1.7 - 7.7 K/uL   Lymphocytes Relative 4 %   Lymphs Abs 0.6 (L) 0.7 - 4.0 K/uL   Monocytes Relative 6 %   Monocytes Absolute 0.8 0.1 - 1.0 K/uL   Eosinophils Relative 0 %   Eosinophils Absolute 0.0 0.0 - 0.5 K/uL   Basophils  Relative 0 %   Basophils Absolute 0.0 0.0 - 0.1 K/uL   Immature Granulocytes 1 %   Abs Immature Granulocytes 0.13 (H) 0.00 - 0.07 K/uL    Comment: Performed at [facility], [address] [place], [place] [address]  Ethanol     Status: None   Collection Time: 09/16/2021  6:39 PM  Result Value Ref Range   Alcohol, Ethyl (B) <10 <10 mg/dL    Comment: (NOTE) Lowest detectable limit for serum alcohol is 10 mg/dL.  For medical purposes only. Performed at [facility], [address], [place], [place] [address]   TSH     Status: Abnormal   Collection Time: 09/19/2021  6:39 PM  Result Value Ref Range   TSH 10.634 (H) 0.350 - 4.500 uIU/mL    Comment: Performed by a 3rd Generation assay with a functional sensitivity of <=0.01 uIU/mL. Performed at [facility], [address], [place], [place] [address]  T4, free     Status: Abnormal   Collection Time: 27-Sep-2021  6:39 PM  Result Value Ref Range   Free T4 1.27 (H) 0.61 - 1.12 ng/dL    Comment: (NOTE) Biotin ingestion may interfere with free T4 tests. If the results are inconsistent with the TSH level, previous test results, or the clinical presentation, then consider biotin interference. If needed, order repeat testing after stopping biotin. Performed at [facility], [address], [place], [place] [address]   I-Stat Chem 8, ED     Status: Abnormal   Collection Time: 2021-09-27  6:45 PM  Result Value Ref Range   Sodium 135 135 - 145 mmol/L   Potassium 3.5 3.5 - 5.1 mmol/L   Chloride 93 (L) 98 - 111 mmol/L   BUN 34 (H) 8 - 23 mg/dL   Creatinine, Ser 6.04 (L) 0.61 - 1.24 mg/dL   Glucose, Bld 540 (H) 70 - 99 mg/dL    Comment: Glucose reference range applies only to samples taken after fasting for at least 8 hours.   Calcium, Ion 1.10 (L) 1.15 - 1.40 mmol/L   TCO2 34 (H) 22 - 32 mmol/L   Hemoglobin 11.2 (L) 13.0 - 17.0 g/dL   HCT 98.1 (L) 19.1 - 47.8 %  Troponin I (High Sensitivity)     Status: None    Collection Time: 27-Sep-2021  8:23 PM  Result Value Ref Range   Troponin I (High Sensitivity) 11 <18 ng/L    Comment: (NOTE) Elevated high sensitivity troponin I (hsTnI) values and significant  changes across serial measurements may suggest ACS but many other  chronic and acute conditions are known to elevate hsTnI results.  Refer to the "Links" section for chest pain algorithms and additional  guidance. Performed at [facility], [address], [place], [place] [address]   Lactic acid, plasma     Status: None   Collection Time: 09-27-21  8:23 PM  Result Value Ref Range   Lactic Acid, Venous 1.1 0.5 - 1.9 mmol/L    Comment: Performed at [facility], [address] [place], [place] [address]  Protime-INR     Status: None   Collection Time: 27-Sep-2021  8:23 PM  Result Value Ref Range   Prothrombin Time 14.2 11.4 - 15.2 seconds   INR 1.1 0.8 - 1.2    Comment: (NOTE) INR goal varies based on device and disease states. Performed at [facility], [address], [place], [place] [address]   Comprehensive metabolic panel     Status: Abnormal   Collection Time: 2021-09-27  8:23 PM  Result Value Ref Range   Sodium 135 135 - 145 mmol/L   Potassium 3.3 (L) 3.5 - 5.1 mmol/L   Chloride 94 (L) 98 - 111 mmol/L   CO2 32 22 - 32 mmol/L   Glucose, Bld 119 (H) 70 - 99 mg/dL    Comment: Glucose reference range applies only to samples taken after fasting for at least 8 hours.   BUN 24 (H) 8 - 23 mg/dL   Creatinine, Ser 9.62 (L) 0.61 - 1.24 mg/dL   Calcium 8.5 (L) 8.9 - 10.3 mg/dL   Total Protein 5.4 (L) 6.5 - 8.1 g/dL   Albumin 2.7 (L) 3.5 - 5.0 g/dL   AST 29 15 - 41 U/L   ALT 21 0 - 44 U/L   Alkaline Phosphatase 145 (H) 38 - 126 U/L   Total Bilirubin 0.6 0.3 - 1.2  mg/dL   GFR, Estimated >96 >04 mL/min    Comment: (NOTE) Calculated using the CKD-EPI Creatinine Equation (2021)    Anion gap 9 5 - 15    Comment: Performed at [facility], [address]  [place], [place] [address]  CK     Status: None   Collection Time: 09-22-2021  8:23 PM  Result Value Ref Range   Total CK 190 49 - 397 U/L    Comment: Performed at [facility], [address] [place], [place] [address]  Magnesium     Status: None   Collection Time: 22-Sep-2021  8:23 PM  Result Value Ref Range   Magnesium 1.7 1.7 - 2.4 mg/dL    Comment: Performed at [facility], [address], [place], [place] [address]  Procalcitonin - Baseline     Status: None   Collection Time: 09/02/21  4:56 AM  Result Value Ref Range   Procalcitonin <0.10 ng/mL    Comment:        Interpretation: PCT (Procalcitonin) <= 0.5 ng/mL: Systemic infection (sepsis) is not likely. Local bacterial infection is possible. (NOTE)       Sepsis PCT Algorithm           Lower Respiratory Tract                                      Infection PCT Algorithm    ----------------------------     ----------------------------         PCT < 0.25 ng/mL                PCT < 0.10 ng/mL          Strongly encourage             Strongly discourage   discontinuation of antibiotics    initiation of antibiotics    ----------------------------     -----------------------------       PCT 0.25 - 0.50 ng/mL            PCT 0.10 - 0.25 ng/mL               OR       >80% decrease in PCT            Discourage initiation of                                            antibiotics      Encourage discontinuation           of antibiotics    ----------------------------     -----------------------------         PCT >= 0.50 ng/mL              PCT 0.26 - 0.50 ng/mL               AND        <80% decrease in PCT             Encourage initiation of                                             antibiotics  Encourage continuation           of antibiotics    ----------------------------     -----------------------------        PCT >= 0.50 ng/mL                  PCT > 0.50 ng/mL               AND         increase in PCT                   Strongly encourage                                      initiation of antibiotics    Strongly encourage escalation           of antibiotics                                     -----------------------------                                           PCT <= 0.25 ng/mL                                                 OR                                        > 80% decrease in PCT                                      Discontinue / Do not initiate                                             antibiotics  Performed at [facility], [address], [place], [place] [address]   CBC     Status: Abnormal   Collection Time: 09/02/21  4:56 AM  Result Value Ref Range   WBC 9.2 4.0 - 10.5 K/uL   RBC 3.58 (L) 4.22 - 5.81 MIL/uL   Hemoglobin 11.0 (L) 13.0 - 17.0 g/dL   HCT 93.8 (L) 10.1 - 75.1 %   MCV 91.9 80.0 - 100.0 fL   MCH 30.7 26.0 - 34.0 pg   MCHC 33.4 30.0 - 36.0 g/dL   RDW 02.5 85.2 - 77.8 %   Platelets 167 150 - 400 K/uL   nRBC 0.0 0.0 - 0.2 %    Comment: Performed at [facility], [address] [place], [place] [address]  Comprehensive metabolic panel     Status: Abnormal   Collection Time: 09/02/21  4:56 AM  Result Value Ref Range   Sodium 133 (L) 135 - 145 mmol/L   Potassium 3.1 (L) 3.5 - 5.1 mmol/L  Chloride 94 (L) 98 - 111 mmol/L   CO2 32 22 - 32 mmol/L   Glucose, Bld 98 70 - 99 mg/dL    Comment: Glucose reference range applies only to samples taken after fasting for at least 8 hours.   BUN 18 8 - 23 mg/dL   Creatinine, Ser 1.61 (L) 0.61 - 1.24 mg/dL   Calcium 7.6 (L) 8.9 - 10.3 mg/dL   Total Protein 5.0 (L) 6.5 - 8.1 g/dL   Albumin 2.4 (L) 3.5 - 5.0 g/dL   AST 27 15 - 41 U/L   ALT 18 0 - 44 U/L   Alkaline Phosphatase 131 (H) 38 - 126 U/L   Total Bilirubin 0.6 0.3 - 1.2 mg/dL   GFR, Estimated >09 >60 mL/min    Comment: (NOTE) Calculated using the CKD-EPI Creatinine Equation (2021)    Anion gap 7 5 - 15    Comment: Performed at [facility],  [address] [place], [place] [address]    CT ABDOMEN PELVIS WO CONTRAST  Result Date: 09/02/2021 CLINICAL DATA:  Abdominal trauma. EXAM: CT ABDOMEN AND PELVIS WITHOUT CONTRAST TECHNIQUE: Multidetector CT imaging of the abdomen and pelvis was performed following the standard protocol without IV contrast. COMPARISON:  09/02/2021 FINDINGS: Lower chest: The heart is enlarged and multi-vessel coronary artery calcifications are noted. There is a moderate left pleural effusion and a small right pleural effusion with patchy atelectasis or infiltrate at the lung bases. Hepatobiliary: A few scattered hypodensities are present in the liver, most likely representing cysts or hemangioma is. No biliary ductal dilatation. The gallbladder is not visualized on exam. Pancreas: Within normal limits on noncontrast exam. Spleen: Normal in size without focal abnormality. Adrenals/Urinary Tract: There is thickening of the right adrenal gland without evidence of discrete nodule. The left adrenal gland is not well seen. Excreted contrast is noted in the kidneys bilaterally. There are bilateral renal cysts, the largest on the left measuring 10.9 cm. No hydronephrosis. Contrast is present in the urinary bladder and multiple bladder diverticula are noted. Stomach/Bowel: The stomach is nondistended. No bowel obstruction, free air or pneumatosis. A large amount of stool is present in the colon and rectum. The rectum is markedly distended measuring up to 10.2 cm in diameter. The appendix is not visualized on exam due to paucity of intra-abdominal fat. There is limited evaluation for bowel wall thickening. Vascular/Lymphatic: There is heavy atherosclerotic calcification of the aorta without evidence of aneurysm. No abdominal or pelvic lymphadenopathy. Reproductive: The prostate gland is normal in size and contains coarse calcifications. Other: No free fluid. Musculoskeletal: There is diffusely decreased mineralization of the bones.  Compression deformities are present at T9 slightly retropulsed element resulting in no significant spinal canal stenosis. There is an acute compression fracture in the superior endplate of L1 with loss of vertebral body height of approximately 75% in slightly retropulsed element with mild spinal canal stenosis. There is an acute comminuted compression fracture at L2 with a retropulsed element and loss of vertebral body height of approximately 70% centrally, resulting in mild spinal canal stenosis. There is a compression fracture in the superior endplate at L4 with no significant retropulsed element. IMPRESSION: 1. Acute comminuted compression fracture at L2 with loss of vertebral body height of 70% centrally. There is a retropulsed element resulting in mild spinal canal stenosis. 2. Compression fracture in the superior endplate at L1 which appears acute with loss of vertebral body height centrally of approximately 75%. There is a slight retropulsed element  resulting in mild spinal canal stenosis. 3. Compression deformities at T9 and L4, indeterminate in age. 4. Large amount of stool in the colon and rectum. The rectum is markedly distended suggesting possibility of fecal impaction. 5. Small right pleural effusion and moderate left pleural effusion with atelectasis or infiltrate at the lung bases. 6. Bilateral renal cysts. 7. Multiple scattered bladder diverticula. Electronically Signed   By: Thornell Sartorius M.D.   On: 09/02/2021 03:38   DG Chest 1 View  Result Date: 11-Sep-2021 CLINICAL DATA:  Fall, pain. EXAM: CHEST  1 VIEW COMPARISON:  Chest x-ray 05/06/2020. FINDINGS: There are atherosclerotic calcifications throughout the aorta. Cardiac silhouette is within normal limits. There are bands of atelectasis in the right lung base and left mid lung. There are patchy opacities in the left lung base with small left pleural effusion. There is no pneumothorax. No acute fractures are seen. IMPRESSION: 1. Small left  pleural effusion with left basilar atelectasis/airspace disease. Electronically Signed   By: Darliss Cheney M.D.   On: 09/09/2021 20:00   DG Pelvis 1-2 Views  Result Date: 09/02/2021 CLINICAL DATA:  Fall EXAM: PELVIS - 1-2 VIEW COMPARISON:  Radiograph dated December 23rd 2011 FINDINGS: No definite displaced fracture. Evaluation is somewhat limited due to patient rotation. Limited evaluation of the bilateral iliac bones and sacrum due to a large amount of overlying bowel gas. Vascular calcifications. IMPRESSION: No definite displaced fracture. Exam is limited by patient rotation, osteopenia and large amount of of gas overlying the bilateral iliac bones and sacrum. Electronically Signed   By: Allegra Lai M.D.   On: 09/09/2021 20:05   CT HEAD WO CONTRAST  Result Date: 09/19/2021 CLINICAL DATA:  Head trauma, minor (Age >= 65y); Neck trauma (Age >= 65y) EXAM: CT HEAD WITHOUT CONTRAST CT CERVICAL SPINE WITHOUT CONTRAST TECHNIQUE: Multidetector CT imaging of the head and cervical spine was performed following the standard protocol without intravenous contrast. Multiplanar CT image reconstructions of the cervical spine were also generated. COMPARISON:  May 06, 2020 FINDINGS: CT HEAD FINDINGS Brain: No evidence of acute infarction, hemorrhage, hydrocephalus, extra-axial collection or mass lesion/mass effect. Global parenchymal volume loss, advanced for age. Encephalomalacia in the RIGHT ACA distribution. Encephalomalacia of the LEFT parietal lobe. Periventricular white matter hypodensities consistent with sequela of chronic microvascular ischemic disease. Vascular: Vascular calcifications. Skull: No acute fracture. Sinuses/Orbits: Revisualization of a soft tissue mass of the RIGHT mastoid with extension into the middle ear with progressive erosive changes of the ossicles. RIGHT mastoid effusion, similar in comparison to prior. Other: LEFT ear cerumen. CT CERVICAL SPINE FINDINGS Alignment: Normal. Skull base  and vertebrae: There is new irregular erosive change along the superior margin of the LEFT facet of C5 with the loose sclerotic osseous fragment noted in the facet space (series 9, image 32). Mild erosive irregularity of the inferior aspect of the LEFT facet of C4 (series 8, image 44). Soft tissues and spinal canal: No prevertebral fluid or swelling. No visible canal hematoma. Disc levels:  Mild uncovertebral hypertrophy at C3-4. Upper chest: Paraseptal and centrilobular emphysema. Unchanged subcentimeter RIGHT thyroid nodule for which no specific follow-up is recommended. Other: Severe atherosclerotic calcifications of the carotid arteries. IMPRESSION: 1. New widening of the LEFT facet of C4-5 with apparent erosive changes along the facet. Differential considerations include osteomyelitis, neoplasm, inflammatory process or sequela of trauma. Recommend further dedicated evaluation with contrasted enhanced MRI cervical spine. 2.  No acute intracranial abnormality. 3. 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 There has been progressive extension of the soft tissue mass to the ossicles. This could be further assessed with dedicated brain MRI. Electronically Signed   By: Meda Klinefelter M.D.   On: 09-06-21 19:34   CT Angio Chest PE W and/or Wo Contrast  Result Date: 09/02/2021 CLINICAL DATA:  Hypoxia EXAM: CT ANGIOGRAPHY CHEST WITH CONTRAST TECHNIQUE: Multidetector CT imaging of the chest was performed using the standard protocol during bolus administration of intravenous contrast. Multiplanar CT image reconstructions and MIPs were obtained to evaluate the vascular anatomy. CONTRAST:  64mL OMNIPAQUE IOHEXOL 350 MG/ML SOLN COMPARISON:  Chest radiograph dated 09-06-2021 FINDINGS: Cardiovascular: Satisfactory opacification the bilateral pulmonary arteries to the segmental level. No evidence of pulmonary embolism. Although not tailored for evaluation of the  thoracic aorta, there is no evidence thoracic aortic aneurysm or dissection. Atherosclerotic calcifications of the aortic arch. The heart is normal in size.  No pericardial effusion. Three vessel coronary atherosclerosis. Mediastinum/Nodes: No suspicious mediastinal lymphadenopathy. Visualized thyroid is notable for an 11 mm right thyroid nodule (series 5/image 27). Not clinically significant; no follow-up imaging recommended (ref: J Am Coll Radiol. 2015 Feb;12(2): 143-50). Lungs/Pleura: No suspicious pulmonary nodules. Moderate left and small right pleural effusions. No frank interstitial edema. Associated bilateral lower lobe atelectasis. No focal consolidation. No pneumothorax. Upper Abdomen: Visualized upper abdomen is notable for bilateral renal cysts and vascular calcifications. Musculoskeletal: Mild superior endplate compression fracture deformity at T7. Moderate compression fracture deformity at T9 with minimal retropulsion. These are both age indeterminate. Review of the MIP images confirms the above findings. IMPRESSION: No evidence of pulmonary embolism. Moderate left and small right pleural effusions. Associated bilateral lower lobe atelectasis. No frank interstitial edema. Mild superior endplate compression fracture deformity at T7. Moderate compression fracture deformity at T9 with mild retropulsion. These are both age indeterminate. Aortic Atherosclerosis (ICD10-I70.0). Electronically Signed   By: Charline Bills M.D.   On: 09/02/2021 01:49   CT CERVICAL SPINE WO CONTRAST  Result Date: 2021-09-06 CLINICAL DATA:  Head trauma, minor (Age >= 65y); Neck trauma (Age >= 65y) EXAM: CT HEAD WITHOUT CONTRAST CT CERVICAL SPINE WITHOUT CONTRAST TECHNIQUE: Multidetector CT imaging of the head and cervical spine was performed following the standard protocol without intravenous contrast. Multiplanar CT image reconstructions of the cervical spine were also generated. COMPARISON:  May 06, 2020 FINDINGS: CT  HEAD FINDINGS Brain: No evidence of acute infarction, hemorrhage, hydrocephalus, extra-axial collection or mass lesion/mass effect. Global parenchymal volume loss, advanced for age. Encephalomalacia in the RIGHT ACA distribution. Encephalomalacia of the LEFT parietal lobe. Periventricular white matter hypodensities consistent with sequela of chronic microvascular ischemic disease. Vascular: Vascular calcifications. Skull: No acute fracture. Sinuses/Orbits: Revisualization of a soft tissue mass of the RIGHT mastoid with extension into the middle ear with progressive erosive changes of the ossicles. RIGHT mastoid effusion, similar in comparison to prior. Other: LEFT ear cerumen. CT CERVICAL SPINE FINDINGS Alignment: Normal. Skull base and vertebrae: There is new irregular erosive change along the superior margin of the LEFT facet of C5 with the loose sclerotic osseous fragment noted in the facet space (series 9, image 32). Mild erosive irregularity of the inferior aspect of the LEFT facet of C4 (series 8, image 44). Soft tissues and spinal canal: No prevertebral fluid or swelling. No visible canal hematoma. Disc levels:  Mild uncovertebral hypertrophy at C3-4. Upper chest: Paraseptal and centrilobular emphysema. Unchanged subcentimeter RIGHT thyroid nodule for which no specific follow-up is recommended. Other: Severe atherosclerotic calcifications of the  carotid arteries. IMPRESSION: 1. New widening of the LEFT facet of C4-5 with apparent erosive changes along the facet. Differential considerations include osteomyelitis, neoplasm, inflammatory process or sequela of trauma. Recommend further dedicated evaluation with contrasted enhanced MRI cervical spine. 2.  No acute intracranial abnormality. 3. 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 There has been progressive extension of the soft tissue mass to the ossicles. This could be further assessed with dedicated  brain MRI. Electronically Signed   By: Meda Klinefelter M.D.   On: 08/23/2021 19:34   MR BRAIN WO CONTRAST  Result Date: 09/02/2021 CLINICAL DATA:  Initial evaluation for neuro deficit, stroke suspected./ EXAM: MRI HEAD WITHOUT CONTRAST TECHNIQUE: Multiplanar, multiecho pulse sequences of the brain and surrounding structures were obtained without intravenous contrast. COMPARISON:  Prior CT from 09/05/2021. FINDINGS: Brain: Diffuse prominence of the CSF containing spaces compatible with generalized cerebral atrophy, fairly advanced in nature. Scattered patchy T2/FLAIR hyperintensity involving the periventricular deep white matter both cerebral hemispheres most consistent with chronic small vessel ischemic disease, moderate in nature. Few scattered remote lacunar infarcts present about the hemispheric cerebral white matter. Scattered areas of encephalomalacia and gliosis involving the parasagittal right frontal and parietal lobes, consistent with prior ischemic infarct. Additional remote infarct noted at the left parietal lobe. Few areas of associated chronic hemosiderin staining noted about these areas of ischemia. Associated wallerian degeneration with atrophy noted at the right cerebral peduncle/midbrain. No abnormal foci of restricted diffusion to suggest acute or subacute ischemia. Gray-white matter differentiation otherwise maintained. No evidence for acute intracranial hemorrhage. No mass lesion, midline shift or mass effect. Diffuse ventricular prominence related to global parenchymal volume loss of hydrocephalus. No extra-axial fluid collection. Pituitary gland suprasellar region within normal limits. Midline structures intact. Vascular: Abnormal flow voids within both internal carotid arteries to the termini, consistent with previously identified occlusion. Major intracranial vascular flow voids are otherwise maintained. Skull and upper cervical spine: Craniocervical junction within normal limits.  Bone marrow signal intensity normal. No scalp soft tissue abnormality. Sinuses/Orbits: Globes and orbital soft tissues within normal limits. Paranasal sinuses are largely clear. 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 Associated chronic appearing right mastoid effusion. Other: None. IMPRESSION: 1. No acute intracranial infarct or other abnormality. 2. 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 3. 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 4. Advanced cerebral atrophy with moderate chronic microvascular ischemic disease. 5. 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 Electronically Signed   By: Rise Mu M.D.   On: 09/02/2021 03:16   MR CERVICAL SPINE WO CONTRAST  Result Date: 09/02/2021 CLINICAL DATA:  Initial evaluation for chronic neck pain, erosive changes at left C4-5 facet. EXAM: MRI CERVICAL SPINE WITHOUT CONTRAST TECHNIQUE: Multiplanar, multisequence MR imaging of the cervical spine was performed. No intravenous contrast was administered. COMPARISON:  Prior CT from 09/19/2021. FINDINGS: Alignment: Examination moderately to severely degraded by motion artifact. Exaggeration of the normal cervical lordosis. 2 mm retrolisthesis of C3 on C4, with trace anterolisthesis of C4 on C5. Findings presumably chronic and facet mediated. Vertebrae: Vertebral body height maintained without acute or chronic fracture. Bone marrow signal intensity within normal limits. No worrisome osseous lesions. Mild discogenic reactive endplate change  present about the C4-5 interspace. Additionally, there is relatively mild edema about the left C4-5 facet, corresponding  with abnormality on prior CT. Overall, appearance on MRI felt to be most consistent with degenerative facet arthritis. No significantly increased edema or surrounding inflammatory changes as is typically seen with septic arthritis. No other abnormal marrow edema. Cord: Signal intensity within the cervical spinal cord grossly within normal limits on this motion degraded exam. Posterior Fossa, vertebral arteries, paraspinal tissues: Craniocervical junction normal. Paraspinous and prevertebral soft tissues grossly within normal limits on this motion degraded exam. Normal flow voids seen within the vertebral arteries bilaterally. Disc levels: C2-C3: Negative interspace. Mild right-sided facet hypertrophy. No significant spinal stenosis. Foramina remain patent. C3-C4: Trace retrolisthesis with mild degenerative intervertebral disc space narrowing. Broad posterior disc protrusion flattens and partially faces the ventral thecal sac, eccentric to the right. Superimposed facet and ligament flavum hypertrophy. Resultant moderate spinal stenosis with mild cord flattening, but no visible cord signal changes. Moderate right C4 foraminal stenosis. Left neural foramen appears grossly patent. C4-C5: Mild degenerative intervertebral disc space narrowing with diffuse disc bulge and bilateral uncovertebral spurring. Bilateral facet arthrosis with mild ligament flavum hypertrophy. Resultant moderate spinal stenosis with mild cord flattening, but no visible cord signal changes. Moderate right with mild left C5 foraminal narrowing. C5-C6: Mild degenerative intervertebral disc space narrowing with diffuse disc bulge and bilateral uncovertebral spurring. Broad posterior disc osteophyte flattens and partially faces the ventral thecal sac with resultant mild spinal stenosis. Severe right with moderate left C6 foraminal narrowing. C6-C7: Minimal annular disc bulge. Advanced left with mild right facet arthrosis. No spinal stenosis. Foramina  appear grossly patent. C7-T1:  Negative interspace.  No canal or foraminal stenosis. Visualized upper thoracic spine demonstrates no significant finding. IMPRESSION: 1. Motion degraded exam. 2. Mild edema about the left C4-5 facet, corresponding with abnormality on prior CT. Overall, appearance is felt to be most consistent with degenerative facet arthritis. No significantly increased edema or surrounding inflammatory changes as is typically seen with septic arthritis. Correlation with symptomatology and laboratory values recommended. 3. Multilevel cervical spondylosis with resultant mild to moderate spinal stenosis at C3-4 through C5-6 as above. Associated moderate right C4 and C5 foraminal stenosis, with severe right and moderate left C6 foraminal narrowing. Electronically Signed   By: Rise Mu M.D.   On: 09/02/2021 03:28   MR THORACIC SPINE WO CONTRAST  Result Date: 09/02/2021 CLINICAL DATA:  Compression fracture. EXAM: MRI THORACIC AND LUMBAR SPINE WITHOUT CONTRAST TECHNIQUE: Multiplanar and multiecho pulse sequences of the thoracic and lumbar spine were obtained without intravenous contrast. COMPARISON:  None. FINDINGS: MRI THORACIC SPINE FINDINGS Alignment: Exaggerated thoracic kyphosis. No traumatic malalignment. Vertebrae: Superior endplate fractures of T5 and T7 with mild height loss and no marrow edema. Compression fractures of T9 and T11 with edematous signal that is confluent at T9 and moderately extensive at T11. Mild retropulsion at both levels with mild spinal canal narrowing at T9 where the cord is deflected but not compressed. Diffuse involvement of the T9 body is notable, but no paravertebral mass or posterior element involvement to imply underlying pathologic lesion. Remote T12 compression fracture with mild superior endplate depression. Cord:  No cord edema or spinal canal hematoma. Paraspinal and other soft tissues: Layering pleural effusions, greater on the left where volume is  small to moderate. Very large left renal cyst Disc levels: No significant degenerative changes or degenerative cord impingement. MRI LUMBAR SPINE FINDINGS Segmentation:  5 lumbar type vertebrae Alignment:  No traumatic  malalignment Vertebrae: Compression fractures of L1 and L2 with marrow edema and fluid-filled fracture planes. Height loss at both levels is advanced. L4 compression fracture with mild depression of the superior endplate. Marrow edema at this level is mild. Retropulsion at L2 and L1 without conus compression. Conus medullaris and cauda equina: Conus extends to the L1-2 level. Conus and cauda equina appear normal. Paraspinal and other soft tissues: Very large left renal cyst. Persistent stool distended rectum. Disc levels: Disc desiccation and fissuring at L4-5 and L5-S1. IMPRESSION: MR THORACIC SPINE IMPRESSION 1. Acute/unhealed compression fractures at T9 and T11. Retropulsion at T9 causes mild spinal stenosis and cord deviation. 2. Remote compression fractures of T5, T7, and T12. 3. Layering pleural effusions greater on the left. MR LUMBAR SPINE IMPRESSION 1. Acute/unhealed compression fractures at L1 and L2 with fluid-filled fracture clefts and advanced height loss. Mild retropulsion at both levels. 2. L4 superior endplate fracture with mild height loss, subacute in appearance. Electronically Signed   By: Tiburcio Pea M.D.   On: 09/02/2021 10:29   MR LUMBAR SPINE WO CONTRAST  Result Date: 09/02/2021 CLINICAL DATA:  Compression fracture. EXAM: MRI THORACIC AND LUMBAR SPINE WITHOUT CONTRAST TECHNIQUE: Multiplanar and multiecho pulse sequences of the thoracic and lumbar spine were obtained without intravenous contrast. COMPARISON:  None. FINDINGS: MRI THORACIC SPINE FINDINGS Alignment: Exaggerated thoracic kyphosis. No traumatic malalignment. Vertebrae: Superior endplate fractures of T5 and T7 with mild height loss and no marrow edema. Compression fractures of T9 and T11 with edematous signal  that is confluent at T9 and moderately extensive at T11. Mild retropulsion at both levels with mild spinal canal narrowing at T9 where the cord is deflected but not compressed. Diffuse involvement of the T9 body is notable, but no paravertebral mass or posterior element involvement to imply underlying pathologic lesion. Remote T12 compression fracture with mild superior endplate depression. Cord:  No cord edema or spinal canal hematoma. Paraspinal and other soft tissues: Layering pleural effusions, greater on the left where volume is small to moderate. Very large left renal cyst Disc levels: No significant degenerative changes or degenerative cord impingement. MRI LUMBAR SPINE FINDINGS Segmentation:  5 lumbar type vertebrae Alignment:  No traumatic malalignment Vertebrae: Compression fractures of L1 and L2 with marrow edema and fluid-filled fracture planes. Height loss at both levels is advanced. L4 compression fracture with mild depression of the superior endplate. Marrow edema at this level is mild. Retropulsion at L2 and L1 without conus compression. Conus medullaris and cauda equina: Conus extends to the L1-2 level. Conus and cauda equina appear normal. Paraspinal and other soft tissues: Very large left renal cyst. Persistent stool distended rectum. Disc levels: Disc desiccation and fissuring at L4-5 and L5-S1. IMPRESSION: MR THORACIC SPINE IMPRESSION 1. Acute/unhealed compression fractures at T9 and T11. Retropulsion at T9 causes mild spinal stenosis and cord deviation. 2. Remote compression fractures of T5, T7, and T12. 3. Layering pleural effusions greater on the left. MR LUMBAR SPINE IMPRESSION 1. Acute/unhealed compression fractures at L1 and L2 with fluid-filled fracture clefts and advanced height loss. Mild retropulsion at both levels. 2. L4 superior endplate fracture with mild height loss, subacute in appearance. Electronically Signed   By: Tiburcio Pea M.D.   On: 09/02/2021 10:29   DG Shoulder  Left  Result Date: 08/29/2021 CLINICAL DATA:  Fall, left shoulder pain EXAM: LEFT SHOULDER - 2+ VIEW COMPARISON:  CT left shoulder dated 05/07/2020 FINDINGS: Left shoulder deformity related to prior impacted femoral neck fracture. No acute fracture  is seen. Visualized soft tissues are within normal limits. Visualized left lung is notable for a suspected small left pleural effusion. IMPRESSION: Left shoulder deformity related to prior impractical femoral neck fracture. No acute fracture is seen. Suspected small left pleural effusion. Electronically Signed   By: Charline Bills M.D.   On: 09-30-21 20:02    Review of Systems Blood pressure (!) 133/58, pulse 68, temperature 97.7 F (36.5 C), temperature source Oral, resp. rate 20, height  (1.676 m), weight 39.6 kg, SpO2 93 %. Physical Exam Constitutional:      General: He is in acute distress.     Appearance: He is ill-appearing.  HENT:     Head: Normocephalic.     Right Ear: External ear normal.     Left Ear: External ear normal.     Nose: Nose normal.  Eyes:     Extraocular Movements: Extraocular movements intact.     Pupils: Pupils are equal, round, and reactive to light.  Neurological:     Mental Status: He is alert.     Motor: Motor function is intact.     Comments: Cannot understand speech Moving all extremities, follows commands Unable to do a detailed motor, or sensory exam due to mental status Gait not assessed Muscle tone normal    Assessment/Plan: Mr. Kadrmas is not a candidate due to severe malnutrition and expected difficulty with wound healing. Not willing to perform multiple kyphoplasties as I believe his nutritional status would render the procedure less than beneficial. Given his situation he would need to wear a brace with surgery or without. The stenosis is not clinically relevant, cord signal is normal. Certainly no open procedure is contemplated. I prefer conservative treatment at this time with followup.   Coletta Memos 09/02/2021, 3:18 PM

## 2021-09-02 NOTE — Plan of Care (Signed)
  Problem: Education: Goal: Knowledge of General Education information will improve Description: Including pain rating scale, medication(s)/side effects and non-pharmacologic comfort measures Outcome: Progressing   Problem: Health Behavior/Discharge Planning: Goal: Ability to manage health-related needs will improve Outcome: Progressing   Problem: Clinical Measurements: Goal: Will remain free from infection Outcome: Progressing   Problem: Clinical Measurements: Goal: Diagnostic test results will improve Outcome: Progressing   Problem: Clinical Measurements: Goal: Respiratory complications will improve Outcome: Progressing   Problem: Clinical Measurements: Goal: Cardiovascular complication will be avoided Outcome: Progressing   Problem: Activity: Goal: Risk for activity intolerance will decrease Outcome: Progressing   Problem: Nutrition: Goal: Adequate nutrition will be maintained Outcome: Progressing   Problem: Coping: Goal: Level of anxiety will decrease Outcome: Progressing   Problem: Elimination: Goal: Will not experience complications related to bowel motility Outcome: Progressing   Problem: Elimination: Goal: Will not experience complications related to urinary retention Outcome: Progressing   Problem: Safety: Goal: Ability to remain free from injury will improve Outcome: Progressing   Problem: Skin Integrity: Goal: Risk for impaired skin integrity will decrease Outcome: Progressing

## 2021-09-02 NOTE — ED Notes (Signed)
Patient transported to MRI 

## 2021-09-02 NOTE — Progress Notes (Signed)
Orthopedic Tech Progress Note Patient Details:  Alan [name] 1946/02/12 [id number]   Ortho Devices Type of Ortho Device: Other (comment) (TLSO BRACE) Ortho Device/Splint Location: at bedside, for use when OOB Ortho Device/Splint Interventions: Ordered, Adjustment   Post Interventions Patient Tolerated: Well Instructions Provided: Care of device, Adjustment of device  Alan [name] Carmine Savoy 09/02/2021, 6:07 PM

## 2021-09-02 NOTE — Progress Notes (Signed)
Patient received from ED.  Awake, following commands.  Oriented to self, disoriented to place, time and situation.  Assisted in position of comfort in bed, on 4L [place].  Noted with unstageable pressure ulcer on left hip area.  Needs addressed.  Taken down by transport to MRI.

## 2021-09-02 NOTE — ED Notes (Signed)
Currently in MRI.

## 2021-09-02 NOTE — ED Notes (Signed)
Remains in MRI 

## 2021-09-02 NOTE — ED Notes (Signed)
Tate Zagal nephew [phone] call with any changes and when pt gets a room

## 2021-09-03 ENCOUNTER — Inpatient Hospital Stay (HOSPITAL_COMMUNITY): Payer: Medicare Other

## 2021-09-03 DIAGNOSIS — R0902 Hypoxemia: Secondary | ICD-10-CM | POA: Diagnosis not present

## 2021-09-03 LAB — COMPREHENSIVE METABOLIC PANEL
ALT: 19 U/L (ref 0–44)
AST: 24 U/L (ref 15–41)
Albumin: 2.4 g/dL — ABNORMAL LOW (ref 3.5–5.0)
Alkaline Phosphatase: 122 U/L (ref 38–126)
Anion gap: 6 (ref 5–15)
BUN: 21 mg/dL (ref 8–23)
CO2: 31 mmol/L (ref 22–32)
Calcium: 8 mg/dL — ABNORMAL LOW (ref 8.9–10.3)
Chloride: 97 mmol/L — ABNORMAL LOW (ref 98–111)
Creatinine, Ser: 0.47 mg/dL — ABNORMAL LOW (ref 0.61–1.24)
GFR, Estimated: >60 mL/min (ref >60)
Glucose, Bld: 112 mg/dL — ABNORMAL HIGH (ref 70–99)
Potassium: 3.4 mmol/L — ABNORMAL LOW (ref 3.5–5.1)
Sodium: 134 mmol/L — ABNORMAL LOW (ref 135–145)
Total Bilirubin: 0.4 mg/dL (ref 0.3–1.2)
Total Protein: 5 g/dL — ABNORMAL LOW (ref 6.5–8.1)

## 2021-09-03 LAB — GLUCOSE, CAPILLARY
Glucose-Capillary: 157 mg/dL — ABNORMAL HIGH (ref 70–99)
Glucose-Capillary: 164 mg/dL — ABNORMAL HIGH (ref 70–99)
Glucose-Capillary: 97 mg/dL (ref 70–99)

## 2021-09-03 LAB — CBC WITH DIFFERENTIAL/PLATELET
Abs Immature Granulocytes: 0.09 10*3/uL — ABNORMAL HIGH (ref 0.00–0.07)
Basophils Absolute: 0 10*3/uL (ref 0.0–0.1)
Basophils Relative: 0 %
Eosinophils Absolute: 0.1 10*3/uL (ref 0.0–0.5)
Eosinophils Relative: 1 %
HCT: 34.2 % — ABNORMAL LOW (ref 39.0–52.0)
Hemoglobin: 11.5 g/dL — ABNORMAL LOW (ref 13.0–17.0)
Immature Granulocytes: 1 %
Lymphocytes Relative: 7 %
Lymphs Abs: 0.6 10*3/uL — ABNORMAL LOW (ref 0.7–4.0)
MCH: 30.7 pg (ref 26.0–34.0)
MCHC: 33.6 g/dL (ref 30.0–36.0)
MCV: 91.4 fL (ref 80.0–100.0)
Monocytes Absolute: 0.7 10*3/uL (ref 0.1–1.0)
Monocytes Relative: 7 %
Neutro Abs: 7.8 10*3/uL — ABNORMAL HIGH (ref 1.7–7.7)
Neutrophils Relative %: 84 %
Platelets: 174 10*3/uL (ref 150–400)
RBC: 3.74 MIL/uL — ABNORMAL LOW (ref 4.22–5.81)
RDW: 13.6 % (ref 11.5–15.5)
WBC: 9.2 10*3/uL (ref 4.0–10.5)
nRBC: 0 % (ref 0.0–0.2)

## 2021-09-03 LAB — TSH
TSH: 12.539 u[IU]/mL — ABNORMAL HIGH (ref 0.350–4.500)
TSH: 12.213 u[IU]/mL — ABNORMAL HIGH (ref 0.350–4.500)

## 2021-09-03 LAB — T3, FREE: T3, Free: 1.7 pg/mL — ABNORMAL LOW (ref 2.0–4.4)

## 2021-09-03 LAB — T4, FREE: Free T4: 1.23 ng/dL — ABNORMAL HIGH (ref 0.61–1.12)

## 2021-09-03 LAB — T3: T3, Total: 50 ng/dL — ABNORMAL LOW (ref 71–180)

## 2021-09-03 LAB — BRAIN NATRIURETIC PEPTIDE: B Natriuretic Peptide: 461 pg/mL — ABNORMAL HIGH (ref 0.0–100.0)

## 2021-09-03 LAB — MAGNESIUM: Magnesium: 1.9 mg/dL (ref 1.7–2.4)

## 2021-09-03 MED ORDER — FUROSEMIDE 10 MG/ML IJ SOLN
40.0000 mg | Freq: Once | INTRAMUSCULAR | Status: AC
  Administered 2021-09-03: 40 mg via INTRAVENOUS
  Filled 2021-09-03: qty 4

## 2021-09-03 MED ORDER — POTASSIUM CHLORIDE CRYS ER 20 MEQ PO TBCR
40.0000 meq | EXTENDED_RELEASE_TABLET | Freq: Once | ORAL | Status: AC
  Administered 2021-09-03: 40 meq via ORAL
  Filled 2021-09-03: qty 2

## 2021-09-03 MED ORDER — ENSURE ENLIVE PO LIQD
237.0000 mL | Freq: Three times a day (TID) | ORAL | Status: DC
  Administered 2021-09-04: 237 mL via ORAL

## 2021-09-03 MED ORDER — HYDRALAZINE HCL 20 MG/ML IJ SOLN: 10.0000 mg | Freq: Four times a day (QID) | INTRAMUSCULAR | Status: DC | PRN

## 2021-09-03 MED ORDER — AMLODIPINE BESYLATE 10 MG PO TABS
10.0000 mg | ORAL_TABLET | Freq: Every day | ORAL | Status: DC
  Administered 2021-09-03 – 2021-09-04 (×2): 10 mg via ORAL
  Filled 2021-09-03: qty 1

## 2021-09-03 MED ORDER — ALBUTEROL SULFATE (2.5 MG/3ML) 0.083% IN NEBU: 2.5000 mg | INHALATION_SOLUTION | RESPIRATORY_TRACT | Status: DC | PRN

## 2021-09-03 NOTE — Progress Notes (Signed)
Notified by CCMD of O2 desaturation and pt presenting with pauses on telemetry and an episode of sinus arrest lasting >12secs. MD notified. New orders in place.  Brooke Pace, RN

## 2021-09-03 NOTE — Progress Notes (Addendum)
PROGRESS NOTE                                                                                                                                                                                                             Patient Demographics:    Alan [name], is a 75 y.o. male, DOB - [birth year], ZOX:[id number]  Outpatient Primary MD for the patient is Barbie Banner, MD    LOS - 1  Admit date - 09/10/2021    Chief Complaint  Patient presents with   Fall       Brief Narrative (HPI from H&P)  -  Alan [name] is a 75 y.o. male with medical history significant of HTN, PTSD, schizophrenia, prior stroke, patient lives alone and is under the supervision of his nephew who visits him almost daily, according to the nephew he has been gradually getting weak and losing weight over the last several months, day of  the admission he found the patient on the floor, this is happened a few times prior to admission recently, he was brought to the hospital where in the ER work-up showed hypotension, dehydration and shortness of breath along with severe cachexia, L-spine fractures failure to thrive and he was admitted for further care   Subjective:   Patient in bed, appears comfortable, denies any headache, no fever, no chest pain or pressure, no shortness of breath , no abdominal pain. No new focal weakness, no back pain.    Assessment  & Plan :     Severe protein calorie malnutrition with cachexia, failure to thrive, multiple falls with acute T9-L2 fractures- for now supportive care which includes protein supplementation, PT OT, pain control, hydration with IV fluids, his BMI is 14 and he appears extremely wasted and I do not think he will benefit from any surgical procedure at this time.  Goal of care will be medical treatment directed towards comfort, she had neurosurgery input, continue supportive care.  There is no sepsis.  Note this  patient is severely cachectic and malnourished with a BMI of 14, will cannot be discharged home alone with L-spine fractures and multiple falls, will definitely require SNF.  2.  Mild to moderate bilateral pleural effusion.  No symptoms at this time, supplemental oxygen this likely is third spacing due to severe protein deficit, supportive care if becomes  more symptomatic then ultrasound-guided paracentesis most likely will be transudative fluid  3.  AMS.  Underlying vascular dementia, mild decline due to acute illness.  Supportive care.  Avoid benzodiazepines and narcotics.  4.  Schizophrenia.  Continue risperidone.  At risk for delirium.  Minimize narcotics benzodiazepines.  As needed Haldol  5.  Bowel impaction.  Disimpacted and bowel regimen.  6.  Hypertension.  Blood pressure is improved will place on Norvasc + Coreg and monitor.  7.  Elevated TSH.  Sick euthyroid syndrome, repeat in 4 weeks.    Addendum - at 12.45 pm - suddenly SOB, ? Aspiration vs CHF, Lasix, Alb Neb, NRB, DW Nephew, Med Rx, if fails - Full comfort.       Condition - Extremely Guarded  Family Communication  :  [name] [phone] called 09/02/20 at 8:10 am - DNR, 09/03/21  Code Status :  DNR  Consults  :  Pall.Care, N Surg  PUD Prophylaxis :     Procedures  :     CT Abd - Pelvis - 1. Acute comminuted compression fracture at L2 with loss of vertebral body height of 70% centrally. There is a retropulsed element resulting in mild spinal canal stenosis. 2. Compression fracture in the superior endplate at L1 which appears acute with loss of vertebral body height centrally of approximately 75%. There is a slight retropulsed element resulting in mild spinal canal stenosis. 3. Compression deformities at T9 and L4, indeterminate in age. 4. Large amount of stool in the colon and rectum. The rectum is markedly distended suggesting possibility of fecal impaction. 5. Small right pleural effusion and moderate left  pleural effusion with atelectasis or infiltrate at the lung bases. 6. Bilateral renal cysts. 7. Multiple scattered bladder diverticula.   CTA Lungs -  No evidence of pulmonary embolism. Moderate left and small right pleural effusions. Associated bilateral lower lobe atelectasis. No frank interstitial edema. Mild superior endplate compression fracture deformity at T7. Moderate compression fracture deformity at T9 with mild retropulsion. These are both age indeterminate. Aortic Atherosclerosis (ICD10-I70.0).  CT Head and C Spine -   Head : No evidence of acute infarction, hemorrhage, hydrocephalus, extra-axial collection or mass lesion/mass effect. Global parenchymal volume loss, advanced for age. Encephalomalacia in the RIGHT ACA distribution. Encephalomalacia of the LEFT parietal lobe. Periventricular white matter hypodensities consistent with sequela of chronic microvascular ischemic disease. Vascular: Vascular calcifications.   C Spine -  1. New widening of the LEFT facet of C4-5 with apparent erosive changes along the facet. Differential considerations include osteomyelitis, neoplasm, inflammatory process or sequela of trauma. Recommend further dedicated evaluation with contrasted enhanced MRI cervical spine. 2.  No acute intracranial abnormality. 3. 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 There has been progressive extension of the soft tissue mass to the ossicles. This could be further assessed with dedicated brain MRI.      Disposition Plan  :    Status is: Observation  DVT Prophylaxis  :  Lovenox    Lab Results  Component Value Date   PLT 174 09/03/2021    Diet :  Diet Order             DIET SOFT Room service appropriate? Yes; Fluid consistency: Thin  Diet effective now                    Inpatient Medications  Scheduled Meds:  (feeding supplement) PROSource Plus  30 mL Oral BID  BM   amLODipine  5 mg Oral Daily    bisacodyl  10 mg Rectal Daily   carvedilol  12.5 mg Oral BID   cholecalciferol  1,000 Units Oral Daily   collagenase   Topical Daily   docusate sodium  200 mg Oral BID   enoxaparin (LOVENOX) injection  20 mg Subcutaneous Q24H   lactose free nutrition  237 mL Oral TID WC   milk and molasses  1 enema Rectal Once   mirtazapine  15 mg Oral QPM   multivitamin with minerals  1 tablet Oral Daily   potassium chloride  40 mEq Oral Once   risperiDONE  1 mg Oral Daily   vitamin E  400 Units Oral q morning   Continuous Infusions:  cefTRIAXone (ROCEPHIN)  IV 1 g (09/03/21 0352)   PRN Meds:.acetaminophen **OR** acetaminophen, haloperidol lactate, ondansetron **OR** ondansetron (ZOFRAN) IV  Time Spent in minutes  30   Susa Raring M.D on 09/03/2021 at 9:42 AM  To page go to [email or web address]   [facility] -  Office  [phone]  See all Orders from today for further details    Objective:   Vitals:   09/02/21 2343 09/03/21 0343 09/03/21 0602 09/03/21 0808  BP: 124/65 118/61  (!) 140/95  Pulse: 60 60  65  Resp: 18 18  16   Temp: 98.1 F (36.7 C) 98 F (36.7 C)  97.8 F (36.6 C)  TempSrc: Oral Oral  Oral  SpO2: 97% 93%  96%  Weight:   41.3 kg   Height:        Wt Readings from Last 3 Encounters:  09/03/21 41.3 kg  05/11/20 58 kg  02/20/12 43.3 kg     Intake/Output Summary (Last 24 hours) at 09/03/2021 0942 Last data filed at 09/03/2021 09/05/2021 Gross per 24 hour  Intake 1790.14 ml  Output --  Net 1790.14 ml     Physical Exam  Extremely frail and cachectic elderly white gentleman laying in hospital bed in no distress, alert x 1 [place].AT,PERRAL Supple Neck, No JVD,   Symmetrical Chest wall movement, Good air movement bilaterally, CTAB RRR,No Gallops, Rubs or new Murmurs,  +ve B.Sounds, Abd Soft, No tenderness,   No Cyanosis, Clubbing or edema      RN pressure injury documentation: Pressure Injury 05/06/20 Sacrum Mid Stage 2 -  Partial thickness loss of dermis  presenting as a shallow open injury with a red, pink wound bed without slough. Partial loss of dermis,shallow red wound bed with scant serous drainage. (Active)  05/06/20 1600  Location: Sacrum  Location Orientation: Mid  Staging: Stage 2 -  Partial thickness loss of dermis presenting as a shallow open injury with a red, pink wound bed without slough.  Wound Description (Comments): Partial loss of dermis,shallow red wound bed with scant serous drainage.  Present on Admission: Yes     Pressure Injury 09/02/21 Hip Left Unstageable - Full thickness tissue loss in which the base of the injury is covered by slough (yellow, tan, gray, green or brown) and/or eschar (tan, brown or black) in the wound bed. 2x4 cm with black area at the center (Active)  09/02/21 1229  Location: Hip (left hip area)  Location Orientation: Left  Staging: Unstageable - Full thickness tissue loss in which the base of the injury is covered by slough (yellow, tan, gray, green or brown) and/or eschar (tan, brown or black) in the wound bed.  Wound Description (Comments): 2x4 cm with black area at the center of  the pressure wound  Present on Admission: Yes     Data Review:    CBC Recent Labs  Lab 08/31/2021 1839 08/31/2021 1845 09/02/21 0456 09/03/21 0129  WBC 12.7*  --  9.2 9.2  HGB 11.5* 11.2* 11.0* 11.5*  HCT 34.2* 33.0* 32.9* 34.2*  PLT 192  --  167 174  MCV 90.7  --  91.9 91.4  MCH 30.5  --  30.7 30.7  MCHC 33.6  --  33.4 33.6  RDW 14.1  --  13.9 13.6  LYMPHSABS 0.6*  --   --  0.6*  MONOABS 0.8  --   --  0.7  EOSABS 0.0  --   --  0.1  BASOSABS 0.0  --   --  0.0    Electrolytes Recent Labs  Lab 09/07/2021 1839 08/31/2021 1845 09/04/2021 2023 09/02/21 0456 09/03/21 0129  NA  --  135 135 133* 134*  K  --  3.5 3.3* 3.1* 3.4*  CL  --  93* 94* 94* 97*  CO2  --   --  32 32 31  GLUCOSE  --  114* 119* 98 112*  BUN  --  34* 24* 18 21  CREATININE  --  0.50* 0.58* 0.42* 0.47*  CALCIUM  --   --  8.5* 7.6* 8.0*   AST  --   --  29 27 24   ALT  --   --  21 18 19   ALKPHOS  --   --  145* 131* 122  BILITOT  --   --  0.6 0.6 0.4  ALBUMIN  --   --  2.7* 2.4* 2.4*  MG  --   --  1.7  --  1.9  PROCALCITON  --   --   --  <0.10  --   LATICACIDVEN 1.1  --  1.1  --   --   INR 1.1  --  1.1  --   --   TSH 10.634*  --   --   --  12.539*  BNP  --   --   --   --  461.0*    ------------------------------------------------------------------------------------------------------------------ No results for input(s): CHOL, HDL, LDLCALC, TRIG, CHOLHDL, LDLDIRECT in the last 72 hours.  No results found for: HGBA1C  Recent Labs    08/28/2021 1839 09/03/21 0129  TSH 10.634* 12.539*  T3FREE 1.7*  --    ------------------------------------------------------------------------------------------------------------------ ID Labs Recent Labs  Lab 08/29/2021 1839 09/12/2021 1845 08/25/2021 2023 09/02/21 0456 09/03/21 0129  WBC 12.7*  --   --  9.2 9.2  PLT 192  --   --  167 174  PROCALCITON  --   --   --  <0.10  --   LATICACIDVEN 1.1  --  1.1  --   --   CREATININE  --  0.50* 0.58* 0.42* 0.47*   Cardiac Enzymes No results for input(s): CKMB, TROPONINI, MYOGLOBIN in the last 168 hours.  Invalid input(s): CK    Radiology Reports CT ABDOMEN PELVIS WO CONTRAST  Result Date: 09/02/2021 CLINICAL DATA:  Abdominal trauma. EXAM: CT ABDOMEN AND PELVIS WITHOUT CONTRAST TECHNIQUE: Multidetector CT imaging of the abdomen and pelvis was performed following the standard protocol without IV contrast. COMPARISON:  09/02/2021 FINDINGS: Lower chest: The heart is enlarged and multi-vessel coronary artery calcifications are noted. There is a moderate left pleural effusion and a small right pleural effusion with patchy atelectasis or infiltrate at the lung bases. Hepatobiliary: A few scattered hypodensities are present in the liver, most likely  representing cysts or hemangioma is. No biliary ductal dilatation. The gallbladder is not visualized  on exam. Pancreas: Within normal limits on noncontrast exam. Spleen: Normal in size without focal abnormality. Adrenals/Urinary Tract: There is thickening of the right adrenal gland without evidence of discrete nodule. The left adrenal gland is not well seen. Excreted contrast is noted in the kidneys bilaterally. There are bilateral renal cysts, the largest on the left measuring 10.9 cm. No hydronephrosis. Contrast is present in the urinary bladder and multiple bladder diverticula are noted. Stomach/Bowel: The stomach is nondistended. No bowel obstruction, free air or pneumatosis. A large amount of stool is present in the colon and rectum. The rectum is markedly distended measuring up to 10.2 cm in diameter. The appendix is not visualized on exam due to paucity of intra-abdominal fat. There is limited evaluation for bowel wall thickening. Vascular/Lymphatic: There is heavy atherosclerotic calcification of the aorta without evidence of aneurysm. No abdominal or pelvic lymphadenopathy. Reproductive: The prostate gland is normal in size and contains coarse calcifications. Other: No free fluid. Musculoskeletal: There is diffusely decreased mineralization of the bones. Compression deformities are present at T9 slightly retropulsed element resulting in no significant spinal canal stenosis. There is an acute compression fracture in the superior endplate of L1 with loss of vertebral body height of approximately 75% in slightly retropulsed element with mild spinal canal stenosis. There is an acute comminuted compression fracture at L2 with a retropulsed element and loss of vertebral body height of approximately 70% centrally, resulting in mild spinal canal stenosis. There is a compression fracture in the superior endplate at L4 with no significant retropulsed element. IMPRESSION: 1. Acute comminuted compression fracture at L2 with loss of vertebral body height of 70% centrally. There is a retropulsed element resulting in  mild spinal canal stenosis. 2. Compression fracture in the superior endplate at L1 which appears acute with loss of vertebral body height centrally of approximately 75%. There is a slight retropulsed element resulting in mild spinal canal stenosis. 3. Compression deformities at T9 and L4, indeterminate in age. 4. Large amount of stool in the colon and rectum. The rectum is markedly distended suggesting possibility of fecal impaction. 5. Small right pleural effusion and moderate left pleural effusion with atelectasis or infiltrate at the lung bases. 6. Bilateral renal cysts. 7. Multiple scattered bladder diverticula. Electronically Signed   By: Thornell Sartorius M.D.   On: 09/02/2021 03:38   DG Chest 1 View  Result Date: 08/31/2021 CLINICAL DATA:  Fall, pain. EXAM: CHEST  1 VIEW COMPARISON:  Chest x-ray 05/06/2020. FINDINGS: There are atherosclerotic calcifications throughout the aorta. Cardiac silhouette is within normal limits. There are bands of atelectasis in the right lung base and left mid lung. There are patchy opacities in the left lung base with small left pleural effusion. There is no pneumothorax. No acute fractures are seen. IMPRESSION: 1. Small left pleural effusion with left basilar atelectasis/airspace disease. Electronically Signed   By: Darliss Cheney M.D.   On: 08/28/2021 20:00   DG Pelvis 1-2 Views  Result Date: 08/28/2021 CLINICAL DATA:  Fall EXAM: PELVIS - 1-2 VIEW COMPARISON:  Radiograph dated December 23rd 2011 FINDINGS: No definite displaced fracture. Evaluation is somewhat limited due to patient rotation. Limited evaluation of the bilateral iliac bones and sacrum due to a large amount of overlying bowel gas. Vascular calcifications. IMPRESSION: No definite displaced fracture. Exam is limited by patient rotation, osteopenia and large amount of of gas overlying the bilateral iliac bones and sacrum.  Electronically Signed   By: Allegra Lai M.D.   On: 09/06/2021 20:05   CT HEAD WO  CONTRAST  Result Date: 08/21/2021 CLINICAL DATA:  Head trauma, minor (Age >= 65y); Neck trauma (Age >= 65y) EXAM: CT HEAD WITHOUT CONTRAST CT CERVICAL SPINE WITHOUT CONTRAST TECHNIQUE: Multidetector CT imaging of the head and cervical spine was performed following the standard protocol without intravenous contrast. Multiplanar CT image reconstructions of the cervical spine were also generated. COMPARISON:  May 06, 2020 FINDINGS: CT HEAD FINDINGS Brain: No evidence of acute infarction, hemorrhage, hydrocephalus, extra-axial collection or mass lesion/mass effect. Global parenchymal volume loss, advanced for age. Encephalomalacia in the RIGHT ACA distribution. Encephalomalacia of the LEFT parietal lobe. Periventricular white matter hypodensities consistent with sequela of chronic microvascular ischemic disease. Vascular: Vascular calcifications. Skull: No acute fracture. Sinuses/Orbits: Revisualization of a soft tissue mass of the RIGHT mastoid with extension into the middle ear with progressive erosive changes of the ossicles. RIGHT mastoid effusion, similar in comparison to prior. Other: LEFT ear cerumen. CT CERVICAL SPINE FINDINGS Alignment: Normal. Skull base and vertebrae: There is new irregular erosive change along the superior margin of the LEFT facet of C5 with the loose sclerotic osseous fragment noted in the facet space (series 9, image 32). Mild erosive irregularity of the inferior aspect of the LEFT facet of C4 (series 8, image 44). Soft tissues and spinal canal: No prevertebral fluid or swelling. No visible canal hematoma. Disc levels:  Mild uncovertebral hypertrophy at C3-4. Upper chest: Paraseptal and centrilobular emphysema. Unchanged subcentimeter RIGHT thyroid nodule for which no specific follow-up is recommended. Other: Severe atherosclerotic calcifications of the carotid arteries. IMPRESSION: 1. New widening of the LEFT facet of C4-5 with apparent erosive changes along the facet. Differential  considerations include osteomyelitis, neoplasm, inflammatory process or sequela of trauma. Recommend further dedicated evaluation with contrasted enhanced MRI cervical spine. 2.  No acute intracranial abnormality. 3. 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 There has been progressive extension of the soft tissue mass to the ossicles. This could be further assessed with dedicated brain MRI. Electronically Signed   By: Meda Klinefelter M.D.   On: 08/27/2021 19:34   CT Angio Chest PE W and/or Wo Contrast  Result Date: 09/02/2021 CLINICAL DATA:  Hypoxia EXAM: CT ANGIOGRAPHY CHEST WITH CONTRAST TECHNIQUE: Multidetector CT imaging of the chest was performed using the standard protocol during bolus administration of intravenous contrast. Multiplanar CT image reconstructions and MIPs were obtained to evaluate the vascular anatomy. CONTRAST:  18mL OMNIPAQUE IOHEXOL 350 MG/ML SOLN COMPARISON:  Chest radiograph dated 09/16/2021 FINDINGS: Cardiovascular: Satisfactory opacification the bilateral pulmonary arteries to the segmental level. No evidence of pulmonary embolism. Although not tailored for evaluation of the thoracic aorta, there is no evidence thoracic aortic aneurysm or dissection. Atherosclerotic calcifications of the aortic arch. The heart is normal in size.  No pericardial effusion. Three vessel coronary atherosclerosis. Mediastinum/Nodes: No suspicious mediastinal lymphadenopathy. Visualized thyroid is notable for an 11 mm right thyroid nodule (series 5/image 27). Not clinically significant; no follow-up imaging recommended (ref: J Am Coll Radiol. 2015 Feb;12(2): 143-50). Lungs/Pleura: No suspicious pulmonary nodules. Moderate left and small right pleural effusions. No frank interstitial edema. Associated bilateral lower lobe atelectasis. No focal consolidation. No pneumothorax. Upper Abdomen: Visualized upper abdomen is notable for bilateral renal  cysts and vascular calcifications. Musculoskeletal: Mild superior endplate compression fracture deformity at T7. Moderate compression fracture deformity at T9 with minimal retropulsion. These are both age  indeterminate. Review of the MIP images confirms the above findings. IMPRESSION: No evidence of pulmonary embolism. Moderate left and small right pleural effusions. Associated bilateral lower lobe atelectasis. No frank interstitial edema. Mild superior endplate compression fracture deformity at T7. Moderate compression fracture deformity at T9 with mild retropulsion. These are both age indeterminate. Aortic Atherosclerosis (ICD10-I70.0). Electronically Signed   By: Charline Bills M.D.   On: 09/02/2021 01:49   CT CERVICAL SPINE WO CONTRAST  Result Date: Sep 02, 2021 CLINICAL DATA:  Head trauma, minor (Age >= 65y); Neck trauma (Age >= 65y) EXAM: CT HEAD WITHOUT CONTRAST CT CERVICAL SPINE WITHOUT CONTRAST TECHNIQUE: Multidetector CT imaging of the head and cervical spine was performed following the standard protocol without intravenous contrast. Multiplanar CT image reconstructions of the cervical spine were also generated. COMPARISON:  May 06, 2020 FINDINGS: CT HEAD FINDINGS Brain: No evidence of acute infarction, hemorrhage, hydrocephalus, extra-axial collection or mass lesion/mass effect. Global parenchymal volume loss, advanced for age. Encephalomalacia in the RIGHT ACA distribution. Encephalomalacia of the LEFT parietal lobe. Periventricular white matter hypodensities consistent with sequela of chronic microvascular ischemic disease. Vascular: Vascular calcifications. Skull: No acute fracture. Sinuses/Orbits: Revisualization of a soft tissue mass of the RIGHT mastoid with extension into the middle ear with progressive erosive changes of the ossicles. RIGHT mastoid effusion, similar in comparison to prior. Other: LEFT ear cerumen. CT CERVICAL SPINE FINDINGS Alignment: Normal. Skull base and vertebrae: There  is new irregular erosive change along the superior margin of the LEFT facet of C5 with the loose sclerotic osseous fragment noted in the facet space (series 9, image 32). Mild erosive irregularity of the inferior aspect of the LEFT facet of C4 (series 8, image 44). Soft tissues and spinal canal: No prevertebral fluid or swelling. No visible canal hematoma. Disc levels:  Mild uncovertebral hypertrophy at C3-4. Upper chest: Paraseptal and centrilobular emphysema. Unchanged subcentimeter RIGHT thyroid nodule for which no specific follow-up is recommended. Other: Severe atherosclerotic calcifications of the carotid arteries. IMPRESSION: 1. New widening of the LEFT facet of C4-5 with apparent erosive changes along the facet. Differential considerations include osteomyelitis, neoplasm, inflammatory process or sequela of trauma. Recommend further dedicated evaluation with contrasted enhanced MRI cervical spine. 2.  No acute intracranial abnormality. 3. 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 There has been progressive extension of the soft tissue mass to the ossicles. This could be further assessed with dedicated brain MRI. Electronically Signed   By: Meda Klinefelter M.D.   On: 09/02/21 19:34   MR BRAIN WO CONTRAST  Result Date: 09/02/2021 CLINICAL DATA:  Initial evaluation for neuro deficit, stroke suspected./ EXAM: MRI HEAD WITHOUT CONTRAST TECHNIQUE: Multiplanar, multiecho pulse sequences of the brain and surrounding structures were obtained without intravenous contrast. COMPARISON:  Prior CT from 2021/09/02. FINDINGS: Brain: Diffuse prominence of the CSF containing spaces compatible with generalized cerebral atrophy, fairly advanced in nature. Scattered patchy T2/FLAIR hyperintensity involving the periventricular deep white matter both cerebral hemispheres most consistent with chronic small vessel ischemic disease, moderate in nature. Few scattered  remote lacunar infarcts present about the hemispheric cerebral white matter. Scattered areas of encephalomalacia and gliosis involving the parasagittal right frontal and parietal lobes, consistent with prior ischemic infarct. Additional remote infarct noted at the left parietal lobe. Few areas of associated chronic hemosiderin staining noted about these areas of ischemia. Associated wallerian degeneration with atrophy noted at the right cerebral peduncle/midbrain. No abnormal foci of restricted diffusion to suggest acute or subacute  ischemia. Gray-white matter differentiation otherwise maintained. No evidence for acute intracranial hemorrhage. No mass lesion, midline shift or mass effect. Diffuse ventricular prominence related to global parenchymal volume loss of hydrocephalus. No extra-axial fluid collection. Pituitary gland suprasellar region within normal limits. Midline structures intact. Vascular: Abnormal flow voids within both internal carotid arteries to the termini, consistent with previously identified occlusion. Major intracranial vascular flow voids are otherwise maintained. Skull and upper cervical spine: Craniocervical junction within normal limits. Bone marrow signal intensity normal. No scalp soft tissue abnormality. Sinuses/Orbits: Globes and orbital soft tissues within normal limits. Paranasal sinuses are largely clear. 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 Associated chronic appearing right mastoid effusion. Other: None. IMPRESSION: 1. No acute intracranial infarct or other abnormality. 2. 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 3. 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 4. Advanced cerebral atrophy with  moderate chronic microvascular ischemic disease. 5. 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 Electronically Signed   By: Rise Mu M.D.   On: 09/02/2021 03:16   MR CERVICAL SPINE WO CONTRAST  Result Date: 09/02/2021 CLINICAL DATA:  Initial evaluation for chronic neck pain, erosive changes at left C4-5 facet. EXAM: MRI CERVICAL SPINE WITHOUT CONTRAST TECHNIQUE: Multiplanar, multisequence MR imaging of the cervical spine was performed. No intravenous contrast was administered. COMPARISON:  Prior CT from 09-06-2021. FINDINGS: Alignment: Examination moderately to severely degraded by motion artifact. Exaggeration of the normal cervical lordosis. 2 mm retrolisthesis of C3 on C4, with trace anterolisthesis of C4 on C5. Findings presumably chronic and facet mediated. Vertebrae: Vertebral body height maintained without acute or chronic fracture. Bone marrow signal intensity within normal limits. No worrisome osseous lesions. Mild discogenic reactive endplate change present about the C4-5 interspace. Additionally, there is relatively mild edema about the left C4-5 facet, corresponding with abnormality on prior CT. Overall, appearance on MRI felt to be most consistent with degenerative facet arthritis. No significantly increased edema or surrounding inflammatory changes as is typically seen with septic arthritis. No other abnormal marrow edema. Cord: Signal intensity within the cervical spinal cord grossly within normal limits on this motion degraded exam. Posterior Fossa, vertebral arteries, paraspinal tissues: Craniocervical junction normal. Paraspinous and prevertebral soft tissues grossly within normal limits on this motion degraded exam. Normal flow voids seen within the vertebral arteries bilaterally. Disc levels: C2-C3: Negative interspace. Mild right-sided facet hypertrophy. No significant spinal stenosis. Foramina remain  patent. C3-C4: Trace retrolisthesis with mild degenerative intervertebral disc space narrowing. Broad posterior disc protrusion flattens and partially faces the ventral thecal sac, eccentric to the right. Superimposed facet and ligament flavum hypertrophy. Resultant moderate spinal stenosis with mild cord flattening, but no visible cord signal changes. Moderate right C4 foraminal stenosis. Left neural foramen appears grossly patent. C4-C5: Mild degenerative intervertebral disc space narrowing with diffuse disc bulge and bilateral uncovertebral spurring. Bilateral facet arthrosis with mild ligament flavum hypertrophy. Resultant moderate spinal stenosis with mild cord flattening, but no visible cord signal changes. Moderate right with mild left C5 foraminal narrowing. C5-C6: Mild degenerative intervertebral disc space narrowing with diffuse disc bulge and bilateral uncovertebral spurring. Broad posterior disc osteophyte flattens and partially faces the ventral thecal sac with resultant mild spinal stenosis. Severe right with moderate left C6 foraminal  narrowing. C6-C7: Minimal annular disc bulge. Advanced left with mild right facet arthrosis. No spinal stenosis. Foramina appear grossly patent. C7-T1:  Negative interspace.  No canal or foraminal stenosis. Visualized upper thoracic spine demonstrates no significant finding. IMPRESSION: 1. Motion degraded exam. 2. Mild edema about the left C4-5 facet, corresponding with abnormality on prior CT. Overall, appearance is felt to be most consistent with degenerative facet arthritis. No significantly increased edema or surrounding inflammatory changes as is typically seen with septic arthritis. Correlation with symptomatology and laboratory values recommended. 3. Multilevel cervical spondylosis with resultant mild to moderate spinal stenosis at C3-4 through C5-6 as above. Associated moderate right C4 and C5 foraminal stenosis, with severe right and moderate left C6 foraminal  narrowing. Electronically Signed   By: Rise Mu M.D.   On: 09/02/2021 03:28   MR THORACIC SPINE WO CONTRAST  Result Date: 09/02/2021 CLINICAL DATA:  Compression fracture. EXAM: MRI THORACIC AND LUMBAR SPINE WITHOUT CONTRAST TECHNIQUE: Multiplanar and multiecho pulse sequences of the thoracic and lumbar spine were obtained without intravenous contrast. COMPARISON:  None. FINDINGS: MRI THORACIC SPINE FINDINGS Alignment: Exaggerated thoracic kyphosis. No traumatic malalignment. Vertebrae: Superior endplate fractures of T5 and T7 with mild height loss and no marrow edema. Compression fractures of T9 and T11 with edematous signal that is confluent at T9 and moderately extensive at T11. Mild retropulsion at both levels with mild spinal canal narrowing at T9 where the cord is deflected but not compressed. Diffuse involvement of the T9 body is notable, but no paravertebral mass or posterior element involvement to imply underlying pathologic lesion. Remote T12 compression fracture with mild superior endplate depression. Cord:  No cord edema or spinal canal hematoma. Paraspinal and other soft tissues: Layering pleural effusions, greater on the left where volume is small to moderate. Very large left renal cyst Disc levels: No significant degenerative changes or degenerative cord impingement. MRI LUMBAR SPINE FINDINGS Segmentation:  5 lumbar type vertebrae Alignment:  No traumatic malalignment Vertebrae: Compression fractures of L1 and L2 with marrow edema and fluid-filled fracture planes. Height loss at both levels is advanced. L4 compression fracture with mild depression of the superior endplate. Marrow edema at this level is mild. Retropulsion at L2 and L1 without conus compression. Conus medullaris and cauda equina: Conus extends to the L1-2 level. Conus and cauda equina appear normal. Paraspinal and other soft tissues: Very large left renal cyst. Persistent stool distended rectum. Disc levels: Disc  desiccation and fissuring at L4-5 and L5-S1. IMPRESSION: MR THORACIC SPINE IMPRESSION 1. Acute/unhealed compression fractures at T9 and T11. Retropulsion at T9 causes mild spinal stenosis and cord deviation. 2. Remote compression fractures of T5, T7, and T12. 3. Layering pleural effusions greater on the left. MR LUMBAR SPINE IMPRESSION 1. Acute/unhealed compression fractures at L1 and L2 with fluid-filled fracture clefts and advanced height loss. Mild retropulsion at both levels. 2. L4 superior endplate fracture with mild height loss, subacute in appearance. Electronically Signed   By: Tiburcio Pea M.D.   On: 09/02/2021 10:29   MR LUMBAR SPINE WO CONTRAST  Result Date: 09/02/2021 CLINICAL DATA:  Compression fracture. EXAM: MRI THORACIC AND LUMBAR SPINE WITHOUT CONTRAST TECHNIQUE: Multiplanar and multiecho pulse sequences of the thoracic and lumbar spine were obtained without intravenous contrast. COMPARISON:  None. FINDINGS: MRI THORACIC SPINE FINDINGS Alignment: Exaggerated thoracic kyphosis. No traumatic malalignment. Vertebrae: Superior endplate fractures of T5 and T7 with mild height loss and no marrow edema. Compression fractures of T9 and T11 with edematous signal that is  confluent at T9 and moderately extensive at T11. Mild retropulsion at both levels with mild spinal canal narrowing at T9 where the cord is deflected but not compressed. Diffuse involvement of the T9 body is notable, but no paravertebral mass or posterior element involvement to imply underlying pathologic lesion. Remote T12 compression fracture with mild superior endplate depression. Cord:  No cord edema or spinal canal hematoma. Paraspinal and other soft tissues: Layering pleural effusions, greater on the left where volume is small to moderate. Very large left renal cyst Disc levels: No significant degenerative changes or degenerative cord impingement. MRI LUMBAR SPINE FINDINGS Segmentation:  5 lumbar type vertebrae Alignment:  No  traumatic malalignment Vertebrae: Compression fractures of L1 and L2 with marrow edema and fluid-filled fracture planes. Height loss at both levels is advanced. L4 compression fracture with mild depression of the superior endplate. Marrow edema at this level is mild. Retropulsion at L2 and L1 without conus compression. Conus medullaris and cauda equina: Conus extends to the L1-2 level. Conus and cauda equina appear normal. Paraspinal and other soft tissues: Very large left renal cyst. Persistent stool distended rectum. Disc levels: Disc desiccation and fissuring at L4-5 and L5-S1. IMPRESSION: MR THORACIC SPINE IMPRESSION 1. Acute/unhealed compression fractures at T9 and T11. Retropulsion at T9 causes mild spinal stenosis and cord deviation. 2. Remote compression fractures of T5, T7, and T12. 3. Layering pleural effusions greater on the left. MR LUMBAR SPINE IMPRESSION 1. Acute/unhealed compression fractures at L1 and L2 with fluid-filled fracture clefts and advanced height loss. Mild retropulsion at both levels. 2. L4 superior endplate fracture with mild height loss, subacute in appearance. Electronically Signed   By: Tiburcio Pea M.D.   On: 09/02/2021 10:29   DG Shoulder Left  Result Date: 09-15-21 CLINICAL DATA:  Fall, left shoulder pain EXAM: LEFT SHOULDER - 2+ VIEW COMPARISON:  CT left shoulder dated 05/07/2020 FINDINGS: Left shoulder deformity related to prior impacted femoral neck fracture. No acute fracture is seen. Visualized soft tissues are within normal limits. Visualized left lung is notable for a suspected small left pleural effusion. IMPRESSION: Left shoulder deformity related to prior impractical femoral neck fracture. No acute fracture is seen. Suspected small left pleural effusion. Electronically Signed   By: Charline Bills M.D.   On: September 15, 2021 20:02

## 2021-09-03 NOTE — Evaluation (Signed)
Occupational Therapy Evaluation Patient Details Name: Alan [name] MRN: [id number] DOB: [birth year] Today's Date: 09/03/2021   History of Present Illness 75 y.o. male presented to ED with generalized weakness after being found down at his home on 08/23/2021. The pt lives alone and nephew checks daily. CT showed multiple subacute lumbar compression fractures, neurosurgery recommends conservative management. PMH significant of HTN, PTSD, schizophrenia, prior stroke.   Clinical Impression   Pt reports living alone and walking with a RW prior to admission. He appears frail and disheveled. Pt presents with significant weakness, poor balance and impaired cognition. His speech was unintelligible at times. Pt noted to be pulling off lines with Sp02 in the upper 70s on RA, rebounded to 100% with replacement of 02, first 7L, reduced to 5L. Pt requires min to total assist for ADL and +2 mod assist for OOB with RW. He will need SNF upon discharge.     Recommendations for follow up therapy are one component of a multi-disciplinary discharge planning process, led by the attending physician.  Recommendations may be updated based on patient status, additional functional criteria and insurance authorization.   Follow Up Recommendations  Skilled nursing-short term rehab (<3 hours/day)    Assistance Recommended at Discharge Frequent or constant Supervision/Assistance  Functional Status Assessment  Patient has had a recent decline in their functional status and/or demonstrates limited ability to make significant improvements in function in a reasonable and predictable amount of time  Equipment Recommendations  Wheelchair (measurements OT);Wheelchair cushion (measurements OT);BSC/3in1;Hospital bed    Recommendations for Other Services       Precautions / Restrictions Precautions Precautions: Fall;Back Required Braces or Orthoses: Spinal Brace Spinal Brace: Thoracolumbosacral orthotic;Applied in  sitting position Restrictions Weight Bearing Restrictions: No      Mobility Bed Mobility Overal bed mobility: Needs Assistance Bed Mobility: Supine to Sit     Supine to sit: Min assist;HOB elevated     General bed mobility comments: minA to complete with HOB elevated and max cues for sequencing    Transfers Overall transfer level: Needs assistance Equipment used: Rolling walker (2 wheels) Transfers: Sit to/from Stand;Bed to chair/wheelchair/BSC Sit to Stand: Mod assist;+2 physical assistance     Step pivot transfers: Mod assist;+2 safety/equipment;+2 physical assistance     General transfer comment: modA to power up with pt leaning posteriorly against bed and needing max cues/modA to extend at trunk to achieve improved posture. modA with assist to move RW for pivoting steps to recliner      Balance Overall balance assessment: Needs assistance;History of Falls Sitting-balance support: Feet supported;Bilateral upper extremity supported Sitting balance-Leahy Scale: Poor     Standing balance support: Bilateral upper extremity supported;During functional activity;Reliant on assistive device for balance Standing balance-Leahy Scale: Zero Standing balance comment: BUE support and external assist to maintain upright                           ADL either performed or assessed with clinical judgement   ADL Overall ADL's : Needs assistance/impaired Eating/Feeding: Minimal assistance;Sitting Eating/Feeding Details (indicate cue type and reason): assist to open containers Grooming: Wash/dry face;Wash/dry hands;Sitting;Minimal assistance Grooming Details (indicate cue type and reason): verbal cues for thoroughness Upper Body Bathing: Moderate assistance;Sitting   Lower Body Bathing: Total assistance;+2 for physical assistance;Sit to/from stand   Upper Body Dressing : Total assistance;Sitting Upper Body Dressing Details (indicate cue type and reason): for TLSO Lower  Body Dressing: Total assistance;Bed level   Toilet  Transfer: +2 for physical assistance;Moderate assistance;Stand-pivot;BSC/3in1;Rolling walker (2 wheels)   Toileting- Clothing Manipulation and Hygiene: Total assistance;+2 for physical assistance;Sit to/from stand               Vision Ability to See in Adequate Light: 0 Adequate Patient Visual Report: No change from baseline       Perception     Praxis      Pertinent Vitals/Pain Pain Assessment: Faces Faces Pain Scale: Hurts little more Pain Location: back Pain Descriptors / Indicators: Discomfort;Grimacing Pain Intervention(s): Monitored during session     Hand Dominance Right   Extremity/Trunk Assessment Upper Extremity Assessment Upper Extremity Assessment: Generalized weakness   Lower Extremity Assessment Lower Extremity Assessment: Defer to PT evaluation   Cervical / Trunk Assessment Cervical / Trunk Assessment: Kyphotic;Other exceptions Cervical / Trunk Exceptions: multiple subacute lumbar compression fx   Communication Communication Communication: Expressive difficulties (mumbles, garbled at times)   Cognition Arousal/Alertness: Awake/alert Behavior During Therapy: Flat affect Overall Cognitive Status: No family/caregiver present to determine baseline cognitive functioning Area of Impairment: Following commands;Safety/judgement;Awareness;Problem solving;Memory                 Orientation Level: Disoriented to;Time   Memory: Decreased recall of precautions;Decreased short-term memory Following Commands: Follows one step commands inconsistently;Follows one step commands with increased time Safety/Judgement: Decreased awareness of safety;Decreased awareness of deficits Awareness: Intellectual Problem Solving: Slow processing;Decreased initiation;Difficulty sequencing;Requires verbal cues General Comments: pt needing increased time to process all questions and cues. originally more lethargic and  needing more stimulation, but improved alertness with addition of O2, poor historian     General Comments      Exercises     Shoulder Instructions      Home Living Family/patient expects to be discharged to:: Skilled nursing facility Living Arrangements: Alone                               Additional Comments: pt from home alone with no family available for more frequent assist      Prior Functioning/Environment Prior Level of Function : Independent/Modified Independent;History of Falls (last six months);Patient poor historian/Family not available             Mobility Comments: pt reports he was mobilizing with use of RW, able to prepare his own meals. but was unable to provide any details about food prepared or frequency of falls. ADLs Comments: pt appearing disshelved        OT Problem List: Decreased strength;Decreased activity tolerance;Impaired balance (sitting and/or standing);Decreased cognition;Decreased safety awareness;Decreased knowledge of use of DME or AE;Decreased knowledge of precautions;Pain      OT Treatment/Interventions: Self-care/ADL training;DME and/or AE instruction;Therapeutic activities;Patient/family education;Cognitive remediation/compensation    OT Goals(Current goals can be found in the care plan section) Acute Rehab OT Goals OT Goal Formulation: Patient unable to participate in goal setting Time For Goal Achievement: 09/17/21 Potential to Achieve Goals: Fair ADL Goals Pt Will Perform Grooming: with supervision;sitting;with set-up Pt Will Perform Upper Body Bathing: with min assist;sitting Pt Will Perform Upper Body Dressing: with supervision;sitting Pt Will Transfer to Toilet: with mod assist;ambulating;bedside commode Additional ADL Goal #1: Pt will perform bed mobility using log roll technique with min assist.  OT Frequency: Min 2X/week   Barriers to D/C:            Co-evaluation PT/OT/SLP Co-Evaluation/Treatment:  Yes Reason for Co-Treatment: Complexity of the patient's impairments (multi-system involvement) PT goals addressed during  session: Mobility/safety with mobility;Balance;Proper use of DME;Strengthening/ROM OT goals addressed during session: ADL's and self-care      AM-PAC OT "6 Clicks" Daily Activity     Outcome Measure Help from another person eating meals?: A Little Help from another person taking care of personal grooming?: A Little Help from another person toileting, which includes using toliet, bedpan, or urinal?: Total Help from another person bathing (including washing, rinsing, drying)?: A Lot Help from another person to put on and taking off regular upper body clothing?: A Lot Help from another person to put on and taking off regular lower body clothing?: Total 6 Click Score: 12   End of Session Equipment Utilized During Treatment: Rolling walker (2 wheels);Gait belt;Back brace;Oxygen Nurse Communication: Other (comment) (aware pt pulled off 02 and IV line)  Activity Tolerance: Patient tolerated treatment well Patient left: in chair;with call bell/phone within reach;with chair alarm set  OT Visit Diagnosis: Unsteadiness on feet (R26.81);Muscle weakness (generalized) (M62.81);Other symptoms and signs involving cognitive function;Pain                Time: 1118-1200 OT Time Calculation (min): 42 min Charges:  OT General Charges $OT Visit: 1 Visit OT Evaluation $OT Eval Moderate Complexity: 1 Mod  Martie Round, OTR/L Acute Rehabilitation Services Pager: [phone] Office: [phone]  Evern Bio 09/03/2021, 1:54 PM

## 2021-09-03 NOTE — Evaluation (Signed)
Physical Therapy Evaluation Patient Details Name: Alan [name] MRN: [id number] DOB: October 08, 1946 Today's Date: 09/03/2021  History of Present Illness  75 y.o. male presented to ED with generalized weakness after being found down at his home on 09/17/2021. The pt lives alone and nephew checks daily. CT showed multiple subacute lumbar compression fractures, neurosurgery recommends conservative management. PMH significant of HTN, PTSD, schizophrenia, prior stroke.   Clinical Impression  Pt in bed upon arrival of PT, agreeable to evaluation at this time. The pt was unable to provide consistent answers to PLOF questions, but states he was living home alone where he mobilized with a RW and was checked on daily by a family member. The pt denies falls, but given current mobility and presence of multiple subacute compression fx, suspect pt has frequent falls at home. The pt now presents with limitations in functional mobility, strength, power, muscular endurance, stability, and activity tolerance due to above dx, and will continue to benefit from skilled PT to address these deficits. The pt is significantly deconditioned with poor ability to initiate or complete movements in bed or maintain static sitting balance without significant assist. He was able to complete sit-stand transfers with assist, but required modA of 2 to complete sit-stand or movements to recliner at this time. The pt is unsafe to return home alone and will require SNF rehab after d/c or increase in family assist to near-constant supervision with physical assist for any mobility (bed-level or OOB transfers).        Recommendations for follow up therapy are one component of a multi-disciplinary discharge planning process, led by the attending physician.  Recommendations may be updated based on patient status, additional functional criteria and insurance authorization.  Follow Up Recommendations Skilled nursing-short term rehab (<3  hours/day)    Assistance Recommended at Discharge Frequent or constant Supervision/Assistance  Functional Status Assessment Patient has had a recent decline in their functional status and demonstrates the ability to make significant improvements in function in a reasonable and predictable amount of time.  Equipment Recommendations  None recommended by PT    Recommendations for Other Services       Precautions / Restrictions Precautions Precautions: Fall;Back Required Braces or Orthoses: Spinal Brace Spinal Brace: Thoracolumbosacral orthotic;Applied in sitting position Restrictions Weight Bearing Restrictions: No      Mobility  Bed Mobility Overal bed mobility: Needs Assistance Bed Mobility: Supine to Sit     Supine to sit: Min assist;HOB elevated     General bed mobility comments: minA to complete with HOB elevated and max cues for sequencing    Transfers Overall transfer level: Needs assistance Equipment used: Rolling walker (2 wheels) Transfers: Sit to/from Stand;Bed to chair/wheelchair/BSC Sit to Stand: Mod assist;+2 physical assistance   Step pivot transfers: Mod assist;+2 safety/equipment;+2 physical assistance       General transfer comment: modA to power up with pt leaning posteriorly against bed and needing max cues/modA to extend at trunk to achieve improved posture. modA with assist to move RW for pivoting steps to recliner    Ambulation/Gait Ambulation/Gait assistance: Mod assist;+2 safety/equipment;+2 physical assistance Gait Distance (Feet): 3 Feet Assistive device: Rolling walker (2 wheels) Gait Pattern/deviations: Step-to pattern;Decreased stride length;Knee flexed in stance - right;Knee flexed in stance - left;Trunk flexed Gait velocity: decreased Gait velocity interpretation: <1.31 ft/sec, indicative of household ambulator   General Gait Details: pt with small lateral steps to recliner with trunk and knees flexed. poor ability to maintain upright,  needing assist to move RW  Balance Overall balance assessment: Needs assistance;History of Falls Sitting-balance support: Feet supported;Bilateral upper extremity supported Sitting balance-Leahy Scale: Poor     Standing balance support: Bilateral upper extremity supported;During functional activity;Reliant on assistive device for balance Standing balance-Leahy Scale: Zero Standing balance comment: BUE support and external assist to maintain upright                             Pertinent Vitals/Pain Pain Assessment: Faces Faces Pain Scale: Hurts little more Pain Location: back Pain Descriptors / Indicators: Discomfort;Grimacing Pain Intervention(s): Limited activity within patient's tolerance;Monitored during session;Repositioned    Home Living Family/patient expects to be discharged to:: Skilled nursing facility                   Additional Comments: pt from home alone with no family available for more frequent assist    Prior Function Prior Level of Function : Independent/Modified Independent;History of Falls (last six months);Patient poor historian/Family not available             Mobility Comments: pt reports he was mobilizing with use of RW, able to prepare his own meals. but was unable to provide any details about food prepared or frequency of falls.       Hand Dominance   Dominant Hand: Right    Extremity/Trunk Assessment   Upper Extremity Assessment Upper Extremity Assessment: Defer to OT evaluation    Lower Extremity Assessment Lower Extremity Assessment: Generalized weakness    Cervical / Trunk Assessment Cervical / Trunk Assessment: Kyphotic;Other exceptions Cervical / Trunk Exceptions: multiple subacute lumbar compression fx  Communication   Communication: Expressive difficulties (pt mumbled or garbled at times)  Cognition Arousal/Alertness: Awake/alert Behavior During Therapy: Flat affect Overall Cognitive Status: No  family/caregiver present to determine baseline cognitive functioning Area of Impairment: Following commands;Safety/judgement;Awareness;Problem solving;Memory                     Memory: Decreased recall of precautions;Decreased short-term memory Following Commands: Follows one step commands inconsistently;Follows one step commands with increased time Safety/Judgement: Decreased awareness of safety;Decreased awareness of deficits Awareness: Intellectual Problem Solving: Slow processing;Decreased initiation;Difficulty sequencing;Requires verbal cues General Comments: pt needing increased time to process all questions and cues. originally more lethargic and needing more stimulation, but improved alertness with addition of O2.        General Comments General comments (skin integrity, edema, etc.): VSS on 3L, pt had removed O2 upon arrival of therapists with Spo2 79-81% on RA, improved to 90s on 3L    Exercises     Assessment/Plan    PT Assessment Patient needs continued PT services  PT Problem List Decreased strength;Decreased range of motion;Decreased activity tolerance;Decreased balance;Decreased mobility;Decreased cognition;Decreased coordination;Decreased knowledge of use of DME;Decreased safety awareness;Pain       PT Treatment Interventions DME instruction;Gait training;Stair training;Functional mobility training;Therapeutic activities;Therapeutic exercise;Balance training;Patient/family education    PT Goals (Current goals can be found in the Care Plan section)  Acute Rehab PT Goals Patient Stated Goal: none states PT Goal Formulation: With patient Time For Goal Achievement: 09/17/21 Potential to Achieve Goals: Fair    Frequency Min 2X/week   Barriers to discharge Decreased caregiver support      Co-evaluation PT/OT/SLP Co-Evaluation/Treatment: Yes Reason for Co-Treatment: Complexity of the patient's impairments (multi-system involvement);Necessary to address  cognition/behavior during functional activity;For patient/therapist safety;To address functional/ADL transfers PT goals addressed during session: Mobility/safety with mobility;Balance;Proper use of DME;Strengthening/ROM  AM-PAC PT "6 Clicks" Mobility  Outcome Measure Help needed turning from your back to your side while in a flat bed without using bedrails?: A Little Help needed moving from lying on your back to sitting on the side of a flat bed without using bedrails?: A Lot Help needed moving to and from a bed to a chair (including a wheelchair)?: Total Help needed standing up from a chair using your arms (e.g., wheelchair or bedside chair)?: Total Help needed to walk in hospital room?: Total Help needed climbing 3-5 steps with a railing? : Total 6 Click Score: 9    End of Session Equipment Utilized During Treatment: Gait belt;Back brace Activity Tolerance: Patient tolerated treatment well;Patient limited by fatigue Patient left: in chair;with call bell/phone within reach;with chair alarm set Nurse Communication: Mobility status PT Visit Diagnosis: Other abnormalities of gait and mobility (R26.89);Muscle weakness (generalized) (M62.81);Repeated falls (R29.6);Pain    Time: 3546-5681 PT Time Calculation (min) (ACUTE ONLY): 36 min   Charges:   PT Evaluation $PT Eval Low Complexity: 1 Low          Vickki Muff, PT, DPT   Acute Rehabilitation Department Pager #: [phone]   Alan [name] 09/03/2021, 1:30 PM

## 2021-09-03 NOTE — Evaluation (Signed)
Clinical/Bedside Swallow Evaluation Patient Details  Name: Alan [name] MRN: [id number] Date of Birth: 1946-06-15  Today's Date: 09/03/2021 Time: SLP Start Time (ACUTE ONLY): 1315 SLP Stop Time (ACUTE ONLY): 1332 SLP Time Calculation (min) (ACUTE ONLY): 17 min  Past Medical History:  Past Medical History:  Diagnosis Date   Esophageal stricture    Hypertension    PTSD (post-traumatic stress disorder)    Stroke [facility])    Past Surgical History:  Past Surgical History:  Procedure Laterality Date   MELANOMA EXCISION     ORIF FEMUR FRACTURE- LISS PLATE     HPI:  75 y.o. male presented to ED with generalized weakness after being found down at his home on 09/02/2021. The pt lives alone and nephew checks daily. CT showed multiple subacute lumbar compression fractures, neurosurgery recommends conservative management. PMH significant of HTN, PTSD, schizophrenia, prior stroke.    Assessment / Plan / Recommendation  Clinical Impression  Pt on non-rebreather was seen for a bedside swallow evaluation. Pt reports having no difficulty with swallowing at baseline and reports no coughing or throat clearing with liquids and solids. Oral mechanism exam revealed edentulous oral cavity and generalized oral weakness but was otherwise [facility]. Pt has dentures at home but does not use them to eat. SLP trialed thin liquid and puree solids. Pt exhibited no oral phase impairments and no overt s/s of penetration or aspiration across consistencies. Pt declined trialing regular solids. Pt unable to elicit volitional swallow and exhibited flexion/extension head movements during swallow of thin liquid x1, reporting he was "trying to get it down". SLP will follow up with MBS d/t concerns for possible pharyngeal phase impairment for objective measurement. Recommend Dys2/thin liquid diet, administering medications crushed in puree until results from [facility] are rendered. SLP Visit Diagnosis: Dysphagia, unspecified (R13.10)     Aspiration Risk  Mild aspiration risk    Diet Recommendation Dysphagia 2 (Fine chop);Thin liquid   Liquid Administration via: Straw Medication Administration: Crushed with puree Supervision: Full supervision/cueing for compensatory strategies;Patient able to self feed Compensations: Minimize environmental distractions;Slow rate;Small sips/bites Postural Changes: Seated upright at 90 degrees;Remain upright for at least 30 minutes after po intake    Other  Recommendations Oral Care Recommendations: Oral care BID    Recommendations for follow up therapy are one component of a multi-disciplinary discharge planning process, led by the attending physician.  Recommendations may be updated based on patient status, additional functional criteria and insurance authorization.  Follow up Recommendations Other (comment) (tbd)      Assistance Recommended at Discharge Frequent or constant Supervision/Assistance  Functional Status Assessment Patient has had a recent decline in their functional status and/or demonstrates limited ability to make significant improvements in function in a reasonable and predictable amount of time  Frequency and Duration min 2x/week  2 weeks       Prognosis Prognosis for Safe Diet Advancement: Fair Barriers to Reach Goals: Severity of deficits      Swallow Study   General Date of Onset: 09/19/2021 HPI: 75 y.o. male presented to ED with generalized weakness after being found down at his home on 08/27/2021. The pt lives alone and nephew checks daily. CT showed multiple subacute lumbar compression fractures, neurosurgery recommends conservative management. PMH significant of HTN, PTSD, schizophrenia, prior stroke. Type of Study: Bedside Swallow Evaluation Previous Swallow Assessment: n/a Diet Prior to this Study: Regular;Thin liquids Temperature Spikes Noted: No Respiratory Status: Non-rebreather History of Recent Intubation: No Behavior/Cognition:  Alert;Cooperative;Requires cueing Oral Cavity Assessment: Within Functional  Limits Oral Care Completed by SLP: No Oral Cavity - Dentition: Edentulous;Dentures, not available Vision: Functional for self-feeding Self-Feeding Abilities: Needs assist Patient Positioning: Upright in bed Baseline Vocal Quality: Hoarse;Low vocal intensity Volitional Cough: Weak Volitional Swallow: Unable to elicit    Oral/Motor/Sensory Function Overall Oral Motor/Sensory Function: Generalized oral weakness   Ice Chips Ice chips: Not tested   Thin Liquid Thin Liquid: Within functional limits Presentation: Straw;Self Fed    Nectar Thick Nectar Thick Liquid: Not tested   Honey Thick Honey Thick Liquid: Not tested   Puree Puree: Within functional limits Presentation: Spoon   Solid     Solid: Not tested      Jeannie Done 09/03/2021,2:40 PM

## 2021-09-03 NOTE — Progress Notes (Signed)
Initial Nutrition Assessment  DOCUMENTATION CODES:   Severe malnutrition in context of chronic illness, Underweight  INTERVENTION:   Change from Boost Plus to Ensure Enlive po QID for more protein, each supplement provides 350 kcal and 20 grams of protein. Continue Prosource Plus 30 ml PO BID, each packet provides 100 kcal and 15 gm protein. Continue MVI with minerals daily. Mighty Shake II TID with meals, each supplement provides 480-500 kcals and 20-23 grams of protein.  NUTRITION DIAGNOSIS:   Severe Malnutrition related to chronic illness (vascular dementia, esophageal stricture, stroke, schizophrenia) as evidenced by severe muscle depletion, severe fat depletion.  GOAL:   Patient will meet greater than or equal to 90% of their needs  MONITOR:   PO intake, Supplement acceptance, Labs, Skin  REASON FOR ASSESSMENT:   Consult Assessment of nutrition requirement/status  ASSESSMENT:   75 yo male admitted with multiple falls at home, acute L-spine fractures, FTT, mild-moderate bilateral pleural effusion. PMH includes HTN, PTSD, schizophrenia, prior stroke, vascular dementia, esophageal stricture.  Patient is not appropriate for surgery per Neurosurgery. Conservative treatment of spine fractures was recommended.  Patient unable to provide any nutrition history. He was drinking an Ensure [place] when RD entered his room. He says that he likes the Ensure, but doesn't like the food on his tray and states it is too much food. He denies chewing or swallowing problems.   Currently on a soft diet. Meal intakes not recorded. Supplements: Prosource Plus 30 ml BID, Boost Plus TID  Labs reviewed. Na 134, K 3.4  Medications reviewed and include cholecalciferol, Colace, Remeron, MVI with minerals, vitamin E, IV Rocephin.   Weight history reviewed. Last weight PTA was from 05/11/20, 58 kg.  Patient meets criteria for severe malnutrition with severe depletion of muscle and subcutaneous fat  mass.   NUTRITION - FOCUSED PHYSICAL EXAM:  Flowsheet Row Most Recent Value  Orbital Region Severe depletion  Upper Arm Region Severe depletion  Thoracic and Lumbar Region Severe depletion  Buccal Region Severe depletion  Temple Region Severe depletion  Clavicle Bone Region Severe depletion  Clavicle and Acromion Bone Region Severe depletion  Scapular Bone Region Severe depletion  Dorsal Hand Severe depletion  Patellar Region Severe depletion  Anterior Thigh Region Severe depletion  Posterior Calf Region Severe depletion  Edema (RD Assessment) None  Hair Reviewed  Eyes Reviewed  Mouth Unable to assess  Skin Reviewed  Nails Reviewed       Diet Order:   Diet Order             DIET SOFT Room service appropriate? Yes; Fluid consistency: Thin  Diet effective now                   EDUCATION NEEDS:   Not appropriate for education at this time  Skin:  Skin Assessment: Skin Integrity Issues: Skin Integrity Issues:: Unstageable Unstageable: L hip  Last BM:  11/14 type 2  Height:   Ht Readings from Last 1 Encounters:  09/02/21 5\' 6"  (1.676 m)    Weight:   Wt Readings from Last 1 Encounters:  09/03/21 41.3 kg    BMI:  Body mass index is 14.69 kg/m.  Estimated Nutritional Needs:   Kcal:  1500-1700  Protein:  70-80 gm  Fluid:  1.5-1.7 L    09/05/21, RD, LDN, CNSC Please refer to Amion for contact information.

## 2021-09-03 NOTE — TOC CAGE-AID Note (Signed)
Transition of Care [facility]) - CAGE-AID Screening   Patient Details  Name: Alan [name] MRN: [id number] Date of Birth: [birth year]  Transition of Care [facility]) CM/SW Contact:    Lennox Leikam C Tarpley-Carter, LCSWA Phone Number: 09/03/2021, 11:18 AM   Clinical Narrative: Pt is unable to participate in Cage Aid.  Pt is not appropriate for assessment, due to pt experiencing dementia.  Trinady Milewski Tarpley-Carter, MSW, LCSW-A Pronouns:  She/Her/Hers [facility]itions of [facility] Clinical Social Worker Direct Number:  [phone] [email or web address]  CAGE-AID Screening: Substance Abuse Screening unable to be completed due to: : Patient unable to participate (Pt is not appropriate for assessment, due to pt experiencing dementia.)             Substance Abuse Education Offered: No

## 2021-09-03 NOTE — Progress Notes (Signed)
Ok to give Kdur x1 for k+ 3.4 per Dr. Thedore Mins.  Ulyses Southward, PharmD, BCIDP, AAHIVP, CPP Infectious Disease Pharmacist 09/03/2021 8:53 AM

## 2021-09-04 ENCOUNTER — Inpatient Hospital Stay (HOSPITAL_COMMUNITY): Payer: Medicare Other

## 2021-09-04 DIAGNOSIS — R627 Adult failure to thrive: Secondary | ICD-10-CM

## 2021-09-04 DIAGNOSIS — R0902 Hypoxemia: Secondary | ICD-10-CM | POA: Diagnosis not present

## 2021-09-04 DIAGNOSIS — E43 Unspecified severe protein-calorie malnutrition: Secondary | ICD-10-CM

## 2021-09-04 DIAGNOSIS — G9341 Metabolic encephalopathy: Secondary | ICD-10-CM | POA: Diagnosis not present

## 2021-09-04 DIAGNOSIS — Z66 Do not resuscitate: Secondary | ICD-10-CM

## 2021-09-04 DIAGNOSIS — Z515 Encounter for palliative care: Secondary | ICD-10-CM

## 2021-09-04 LAB — CBC WITH DIFFERENTIAL/PLATELET
Abs Immature Granulocytes: 0.13 10*3/uL — ABNORMAL HIGH (ref 0.00–0.07)
Basophils Absolute: 0 10*3/uL (ref 0.0–0.1)
Basophils Relative: 0 %
Eosinophils Absolute: 0 10*3/uL (ref 0.0–0.5)
Eosinophils Relative: 0 %
HCT: 33.5 % — ABNORMAL LOW (ref 39.0–52.0)
Hemoglobin: 11.6 g/dL — ABNORMAL LOW (ref 13.0–17.0)
Immature Granulocytes: 1 %
Lymphocytes Relative: 9 %
Lymphs Abs: 0.9 10*3/uL (ref 0.7–4.0)
MCH: 31.4 pg (ref 26.0–34.0)
MCHC: 34.6 g/dL (ref 30.0–36.0)
MCV: 90.8 fL (ref 80.0–100.0)
Monocytes Absolute: 0.9 10*3/uL (ref 0.1–1.0)
Monocytes Relative: 8 %
Neutro Abs: 8.5 10*3/uL — ABNORMAL HIGH (ref 1.7–7.7)
Neutrophils Relative %: 82 %
Platelets: 154 10*3/uL (ref 150–400)
RBC: 3.69 MIL/uL — ABNORMAL LOW (ref 4.22–5.81)
RDW: 13.8 % (ref 11.5–15.5)
WBC: 10.4 10*3/uL (ref 4.0–10.5)
nRBC: 0 % (ref 0.0–0.2)

## 2021-09-04 LAB — COMPREHENSIVE METABOLIC PANEL
ALT: 20 U/L (ref 0–44)
AST: 25 U/L (ref 15–41)
Albumin: 2.4 g/dL — ABNORMAL LOW (ref 3.5–5.0)
Alkaline Phosphatase: 117 U/L (ref 38–126)
Anion gap: 11 (ref 5–15)
BUN: 21 mg/dL (ref 8–23)
CO2: 29 mmol/L (ref 22–32)
Calcium: 8.1 mg/dL — ABNORMAL LOW (ref 8.9–10.3)
Chloride: 98 mmol/L (ref 98–111)
Creatinine, Ser: 0.44 mg/dL — ABNORMAL LOW (ref 0.61–1.24)
GFR, Estimated: >60 mL/min (ref >60)
Glucose, Bld: 88 mg/dL (ref 70–99)
Potassium: 3.4 mmol/L — ABNORMAL LOW (ref 3.5–5.1)
Sodium: 138 mmol/L (ref 135–145)
Total Bilirubin: 0.6 mg/dL (ref 0.3–1.2)
Total Protein: 5 g/dL — ABNORMAL LOW (ref 6.5–8.1)

## 2021-09-04 LAB — MAGNESIUM: Magnesium: 1.8 mg/dL (ref 1.7–2.4)

## 2021-09-04 LAB — BRAIN NATRIURETIC PEPTIDE: B Natriuretic Peptide: 315.9 pg/mL — ABNORMAL HIGH (ref 0.0–100.0)

## 2021-09-04 LAB — PROCALCITONIN: Procalcitonin: <0.1 ng/mL

## 2021-09-04 MED ORDER — FUROSEMIDE 10 MG/ML IJ SOLN
20.0000 mg | Freq: Once | INTRAMUSCULAR | Status: AC
  Administered 2021-09-04: 20 mg via INTRAVENOUS
  Filled 2021-09-04: qty 2

## 2021-09-04 MED ORDER — POTASSIUM CHLORIDE 10 MEQ/100ML IV SOLN
10.0000 meq | INTRAVENOUS | Status: DC
  Administered 2021-09-04 (×3): 10 meq via INTRAVENOUS
  Filled 2021-09-04 (×3): qty 100

## 2021-09-04 MED ORDER — MORPHINE SULFATE (CONCENTRATE) 10 MG/0.5ML PO SOLN
5.0000 mg | Freq: Four times a day (QID) | ORAL | Status: DC
  Administered 2021-09-04 – 2021-09-07 (×11): 5 mg via ORAL
  Filled 2021-09-04 (×11): qty 0.5

## 2021-09-04 MED ORDER — POTASSIUM CHLORIDE CRYS ER 20 MEQ PO TBCR
20.0000 meq | EXTENDED_RELEASE_TABLET | Freq: Once | ORAL | Status: AC
  Administered 2021-09-04: 20 meq via ORAL
  Filled 2021-09-04: qty 1

## 2021-09-04 MED ORDER — FUROSEMIDE 10 MG/ML IJ SOLN
40.0000 mg | Freq: Once | INTRAMUSCULAR | Status: AC
  Administered 2021-09-04: 40 mg via INTRAVENOUS
  Filled 2021-09-04: qty 4

## 2021-09-04 MED ORDER — POTASSIUM CHLORIDE 20 MEQ PO PACK: 60.0000 meq | PACK | Freq: Once | ORAL | Status: DC

## 2021-09-04 MED ORDER — ALBUMIN HUMAN 25 % IV SOLN: 25.0000 g | Freq: Four times a day (QID) | INTRAVENOUS | Status: DC

## 2021-09-04 MED ORDER — SODIUM CHLORIDE 0.9 % IV SOLN: INTRAVENOUS | Status: DC

## 2021-09-04 MED ORDER — MORPHINE SULFATE (CONCENTRATE) 10 MG/0.5ML PO SOLN: 5.0000 mg | ORAL | Status: DC | PRN

## 2021-09-04 NOTE — Progress Notes (Signed)
Modified Barium Swallow Progress Note  Patient Details  Name: Alan [name] MRN: [id number] Date of Birth: 08-20-1946  Today's Date: 09/04/2021  Modified Barium Swallow completed.  Full report located under Chart Review in the Imaging Section.  Brief recommendations include the following:  Clinical Impression  Pt demonstrated mild oral and a suspected primary esophageal dysphagia. Oral phase marked by reduced posterior propulsion and delayed transit. His strength and range of motion of laryngeal structures was within functional range. Significant cervical esophageal residue noted after initial trial puree that was present for majority of study with thicker consistencies. Upon further esophageal  scan, appearance of residue in distal esophagus. Suspect altered esophageal pressures may have led to the mild laryngeal penetration present with thin  (PAS 3). No aspiration observed. Thin barium assisted in significant reduction of stasis. Prolonged mastication with cracker. Thin liquids recommended and initially Dys 2 recommended however therapist has since learned that pt is now comfort care, therefore recommend regular texture and family can order what pt can tolerate. ST will plan to see pt once more for family education.   Swallow Evaluation Recommendations       SLP Diet Recommendations: Regular solids;Thin liquid;Other (Comment) (Dys 2 assessed however now comfort care therefore recommend regular)   Liquid Administration via: Cup;Straw   Medication Administration: Crushed with puree   Supervision: Staff to assist with self feeding;Full supervision/cueing for compensatory strategies   Compensations: Minimize environmental distractions;Small sips/bites;Slow rate;Clear throat intermittently   Postural Changes: Seated upright at 90 degrees;Remain semi-upright after after feeds/meals (Comment)   Oral Care Recommendations: Oral care BID        Royce Macadamia 09/04/2021,3:11 PM  Breck Coons Victor.Ed Nurse, children's [phone] Office [phone]

## 2021-09-04 NOTE — Consult Note (Signed)
Consultation Note Date: 09/04/2021   Patient Name: Alan [name]  DOB: 09-Mar-1946  MRN: [id number]  Age / Sex: 75 y.o., male  PCP: Barbie Banner, MD Referring Physician: Leroy Sea, MD  Reason for Consultation: Establishing goals of care and Psychosocial/spiritual support  HPI/Patient Profile: 75 y.o. male  admitted on 09/08/21 with medical history significant of HTN, PTSD, schizophrenia, prior stroke, patient lives alone and is under the supervision of his nephew who visits him almost daily, according to the nephew he has been gradually getting weak and losing weight over the last several months, day of  the admission he found the patient on the floor, this is happened a few times prior to admission recently, he was brought to the hospital where in the ER work-up showed hypotension, dehydration and shortness of breath along with severe cachexia, L-spine fractures failure to thrive and he was admitted for further care   Family face treatment option decision,Wounds advanced directive decisions and anticipatory care needs.  Clinical Assessment and Goals of Care:  This NP Lorinda Creed reviewed medical records, received report from team, assessed the patient and then meet at the patient's bedside along with his brother Roshawn Ayala, nephew Fredrik Cove Lowery/nephew and main support person, and two nieces/Whitney and Phoebe to discuss diagnosis, prognosis, GOC, EOL wishes disposition and options.   Concept of Palliative Care was introduced as specialized medical care for people and their families living with serious illness.  If focuses on providing relief from the symptoms and stress of a serious illness.  The goal is to improve quality of life for both the patient and the family.  Values and goals of care important to patient and family were attempted to be elicited.  Created space and opportunity for patient   and family to explore thoughts and feelings regarding current medical situation.  All family are in agreement that the patient has had continued physical, functional and cognitive decline over the past many months.  All agree that current living situation is not providing the level of care appropriate for this patient at this time.  All are in agreement that comfort, quality and dignity are the priority of care for this patient at this time.     A  discussion was had today regarding advanced directives.  Concepts specific to code status, artifical feeding and hydration, continued IV antibiotics and rehospitalization was had.  The difference between a aggressive medical intervention path  and a palliative comfort care path for this patient at this time was had.     MOST form completed to reflect full comfort   Natural trajectory and expectations at EOL were discussed.  Questions and concerns addressed.  Patient  encouraged to call with questions or concerns.     PMT will continue to support holistically.              NEXT OF KIN/ brother Andray Assefa however he leans on nephew Efrain Clauson for support and direction.     SUMMARY OF RECOMMENDATIONS  Code Status/Advance Care Planning: DNR No artificial feeding or hydration now or in the future, diet as tolerated No further diagnostics Minimize medications, no further IV antibiotics Symptom management  Reevaluate in 24 to 36 hours for transition of care    Symptom Management:  Dyspnea/Pain: Roxanol 5 mg p.o./sublingual scheduled every 6 hours and Roxanol 5 mg p.o. sublingual every 2 hours as needed for breakthrough pain and dyspnea  Palliative Prophylaxis:  Aspiration, Bowel Regimen, Frequent Pain Assessment, Oral Care, and Palliative Wound Care  Additional Recommendations (Limitations, Scope, Preferences): Full Comfort Care  Psycho-social/Spiritual:  Desire for further Chaplaincy support:yes Additional Recommendations: Education  on Hospice  Prognosis:  Evaluate in 24-36 hrs for appropriate transition of care  Discharge Planning: To Be Determined      Primary Diagnoses: Present on Admission:  Schizophrenia (HCC)  HTN (hypertension)  (Resolved) Acute metabolic encephalopathy  Hypoxia  Pressure injury of skin  Severe protein-calorie malnutrition (HCC)  Pleural effusion  Compression fracture of lumbar vertebra, closed, initial encounter (HCC)  Constipation   I have reviewed the medical record, interviewed the patient and family, and examined the patient. The following aspects are pertinent.  Past Medical History:  Diagnosis Date   Esophageal stricture    Hypertension    PTSD (post-traumatic stress disorder)    Stroke [facility])    Social History   Socioeconomic History   Marital status: Single    Spouse name: Not on file   Number of children: Not on file   Years of education: Not on file   Highest education level: Not on file  Occupational History   Not on file  Tobacco Use   Smoking status: Every Day    Packs/day: 1.50    Types: Cigarettes   Smokeless tobacco: Never  Vaping Use   Vaping Use: Never used  Substance and Sexual Activity   Alcohol use: No   Drug use: No   Sexual activity: Not on file  Other Topics Concern   Not on file  Social History Narrative   Not on file   Social Determinants of Health   Financial Resource Strain: Not on file  Food Insecurity: Not on file  Transportation Needs: Not on file  Physical Activity: Not on file  Stress: Not on file  Social Connections: Not on file   Family History  Family history unknown: Yes   Scheduled Meds:  (feeding supplement) PROSource Plus  30 mL Oral BID BM   amLODipine  10 mg Oral Daily   cholecalciferol  1,000 Units Oral Daily   collagenase   Topical Daily   docusate sodium  200 mg Oral BID   enoxaparin (LOVENOX) injection  20 mg Subcutaneous Q24H   feeding supplement  237 mL Oral TID BM   furosemide  40 mg Intravenous  Once   milk and molasses  1 enema Rectal Once   mirtazapine  15 mg Oral QPM   multivitamin with minerals  1 tablet Oral Daily   potassium chloride  60 mEq Oral Once   risperiDONE  1 mg Oral Daily   vitamin E  400 Units Oral q morning   Continuous Infusions:  sodium chloride 10 mL/hr at 09/04/21 0539   albumin human     cefTRIAXone (ROCEPHIN)  IV 1 g (09/04/21 0540)   PRN Meds:.acetaminophen **OR** acetaminophen, albuterol, haloperidol lactate, hydrALAZINE, ondansetron **OR** ondansetron (ZOFRAN) IV Medications Prior to Admission:  Prior to Admission medications   Medication Sig Start Date End Date Taking? Authorizing Provider  acetaminophen (  TYLENOL) 500 MG tablet Take 500-1,000 mg by mouth every 6 (six) hours as needed for moderate pain or headache.   Yes [provider]  amLODipine (NORVASC) 5 MG tablet Take 5 mg by mouth in the morning and at bedtime.   Yes [provider]  carvedilol (COREG) 25 MG tablet Take 25 mg by mouth 2 (two) times daily. 06/26/21  Yes [provider]  Cholecalciferol (VITAMIN D3 PO) Take 1 tablet by mouth daily.   Yes [provider]  mirtazapine (REMERON) 15 MG tablet Take 15 mg by mouth every evening. 07/26/21  Yes [provider]  Multiple Vitamin (MULTIVITAMIN WITH MINERALS) TABS tablet Take 1 tablet by mouth daily. 05/10/20  Yes Regalado, Belkys A, MD  risperiDONE (RISPERDAL) 1 MG tablet Take 1 mg by mouth daily.   Yes [provider]  vitamin E 400 UNIT capsule Take 400 Units by mouth every morning.   Yes [provider]  tiotropium (SPIRIVA) 18 MCG inhalation capsule Place 1 capsule (18 mcg total) into inhaler and inhale daily. Patient not taking: No sig reported 02/20/12 02/19/13  Dorothea Ogle, MD   No Known Allergies Review of Systems  Unable to perform ROS  Physical Exam Constitutional:      Appearance: He is cachectic. He is ill-appearing.  Cardiovascular:     Rate and Rhythm: Normal  rate.  Pulmonary:     Breath sounds: Examination of the right-lower field reveals decreased breath sounds. Examination of the left-lower field reveals decreased breath sounds. Decreased breath sounds present.  Musculoskeletal:     Comments: -Generalized muscle atrophy and weakness  Skin:    General: Skin is warm and dry.     Comments: Noted skin breakdown EMR  Neurological:     Mental Status: He is lethargic.    Vital Signs: BP (!) 125/56 (BP Location: Left Arm)   Pulse 62   Temp 97.8 F (36.6 C) (Oral)   Resp 17   Ht 5\' 6"  (1.676 m)   Wt 41.3 kg   SpO2 97%   BMI 14.69 kg/m  Pain Scale: 0-10   Pain Score: 0-No pain   SpO2: SpO2: 97 % O2 Device:SpO2: 97 % O2 Flow Rate: .O2 Flow Rate (L/min): 7 L/min  IO: Intake/output summary:  Intake/Output Summary (Last 24 hours) at 09/04/2021 1356 Last data filed at 09/04/2021 09/06/2021 Gross per 24 hour  Intake 240 ml  Output 2350 ml  Net -2110 ml    LBM: Last BM Date: 09/03/21 Baseline Weight: Weight: 39.8 kg Most recent weight: Weight: 41.3 kg     Palliative Assessment/Data:  30 % at best   Discussed with Dr 09/05/21 and bedside  RN via secure chat  Time In: 1230 Time Out: 1400 Time Total: 90 minutes Greater than 50%  of this time was spent counseling and coordinating care related to the above assessment and plan.  Signed by: Thedore Mins, NP   Please contact Palliative Medicine Team phone at [phone] for questions and concerns.  For individual provider: See [phone]

## 2021-09-04 NOTE — Consult Note (Signed)
NAME:  Alan [name], MRN:  [id number], DOB:  1946/05/07, LOS: 2 ADMISSION DATE:  09/03/2021, CONSULTATION DATE:  09/04/21 REFERRING MD:  Susa Raring, MD CHIEF COMPLAINT:  Pleural Effusion   History of Present Illness:  Alan [name] is a 75 year old male with history of schizophrenia and vascular dementia who is admitted for failure to thrive, severe protein calorie malnutrition and acute hypoxemic respiratory failure.   His chest imaging shows increasing bilateral pleural effusions, right greater than left, with compressive atelectasis and increasing oxygen needs. Speech evaluation 11/14 showed possible pharyngeal phase impairment.   PCCM consulted for consideration of palliative thoracentesis for respiratory failure.  Pertinent  Medical History   Past Medical History:  Diagnosis Date   Esophageal stricture    Hypertension    PTSD (post-traumatic stress disorder)    Stroke (HCC)    Significant Hospital Events: Including procedures, antibiotic start and stop dates in addition to other pertinent events   11/12 admitted  Interim History / Subjective:   Weaned to 3L during time of evaluation and bedside US. He maintained SpO2 93-96%  Objective   Blood pressure 107/63, pulse 61, temperature 98.5 F (36.9 C), temperature source Axillary, resp. rate 19, height 5\' 6"  (1.676 m), weight 41.3 kg, SpO2 98 %.        Intake/Output Summary (Last 24 hours) at 09/04/2021 1101 Last data filed at 09/04/2021 0804 Gross per 24 hour  Intake 240 ml  Output 2350 ml  Net -2110 ml   Filed Weights   09/02/21 0348 09/02/21 1038 09/03/21 0602  Weight: 39.8 kg 39.6 kg 41.3 kg   Examination: General: elderly male, cachectic/frail appearing HENT: [place]/AT, moist mucous membranes, sclera anicteric Lungs: no respiratory distress, no wheezing, diminished breath sounds bilaterally Cardiovascular: RRR, no murmurs Abdomen: soft, non-tender, non-distended, bowel sounds present Extremities:  warm, no edema, muscle wasting Neuro: alert, but not oriented, moving all extremities  Bedside 09/05/21: small to moderate simple appearing left pleural effusion  CXR 11/15 reviewed, bilateral pleural effusion L > R. Left lung atelectasis. Increasing from prior films.  Resolved Hospital Problem list     Assessment & Plan:  Acute Hypoxemic Respiratory failure Bilateral Pleural Effusions, right > left Severe Protein Calorie Malnutrition Schizophrenia Vascular Dementia  Plan: - Will hold off on thoracentesis and try to push further diuresis. Removal of pleural fluid less likely to provide significant relief of patient's respiratory failure - Schedule albumin 25g q6hrs over the next 24 hours - Will give another dose of lasix 40mg  this afternoon - Patient planning to go home with hospice in coming days  Labs   CBC: Recent Labs  Lab 09/09/2021 1839 09/18/2021 1845 09/02/21 0456 09/03/21 0129 09/04/21 0131  WBC 12.7*  --  9.2 9.2 10.4  NEUTROABS 11.2*  --   --  7.8* 8.5*  HGB 11.5* 11.2* 11.0* 11.5* 11.6*  HCT 34.2* 33.0* 32.9* 34.2* 33.5*  MCV 90.7  --  91.9 91.4 90.8  PLT 192  --  167 174 154    Basic Metabolic Panel: Recent Labs  Lab 08/29/2021 1845 09/04/2021 2023 09/02/21 0456 09/03/21 0129 09/04/21 0131  NA 135 135 133* 134* 138  K 3.5 3.3* 3.1* 3.4* 3.4*  CL 93* 94* 94* 97* 98  CO2  --  32 32 31 29  GLUCOSE 114* 119* 98 112* 88  BUN 34* 24* 18 21 21   CREATININE 0.50* 0.58* 0.42* 0.47* 0.44*  CALCIUM  --  8.5* 7.6* 8.0* 8.1*  MG  --  1.7  --  1.9 1.8   GFR: Estimated Creatinine Clearance: 45.2 mL/min (A) (by C-G formula based on SCr of 0.44 mg/dL (L)). Recent Labs  Lab 09-14-2021 1839 2021-09-14 2023 09/02/21 0456 09/03/21 0129 09/04/21 0131 09/04/21 0756  PROCALCITON  --   --  <0.10  --   --  <0.10  WBC 12.7*  --  9.2 9.2 10.4  --   LATICACIDVEN 1.1 1.1  --   --   --   --     Liver Function Tests: Recent Labs  Lab Sep 14, 2021 2023 09/02/21 0456 09/03/21 0129  09/04/21 0131  AST 29 27 24 25   ALT 21 18 19 20   ALKPHOS 145* 131* 122 117  BILITOT 0.6 0.6 0.4 0.6  PROT 5.4* 5.0* 5.0* 5.0*  ALBUMIN 2.7* 2.4* 2.4* 2.4*   No results for input(s): LIPASE, AMYLASE in the last 168 hours. No results for input(s): AMMONIA in the last 168 hours.  ABG    Component Value Date/Time   TCO2 34 (H) 2021-09-14 1845     Coagulation Profile: Recent Labs  Lab 09/14/2021 1839 September 14, 2021 2023  INR 1.1 1.1    Cardiac Enzymes: Recent Labs  Lab 09/14/2021 2023  CKTOTAL 190    HbA1C: No results found for: HGBA1C  CBG: Recent Labs  Lab 09/03/21 1205 09/03/21 1629 09/03/21 2151  GLUCAP 157* 164* 97    Review of Systems:   Unable to perform complete review of systems due to patient's underlying mental status.  Past Medical History:  He,  has a past medical history of Esophageal stricture, Hypertension, PTSD (post-traumatic stress disorder), and Stroke (HCC).   Surgical History:   Past Surgical History:  Procedure Laterality Date   MELANOMA EXCISION     ORIF FEMUR FRACTURE- LISS PLATE       Social History:   reports that he has been smoking cigarettes. He has been smoking an average of 1.5 packs per day. He has never used smokeless tobacco. He reports that he does not drink alcohol and does not use drugs.   Family History:  His Family history is unknown by patient.   Allergies No Known Allergies   Home Medications  Prior to Admission medications   Medication Sig Start Date End Date Taking? Authorizing Provider  acetaminophen (TYLENOL) 500 MG tablet Take 500-1,000 mg by mouth every 6 (six) hours as needed for moderate pain or headache.   Yes [provider]  amLODipine (NORVASC) 5 MG tablet Take 5 mg by mouth in the morning and at bedtime.   Yes [provider]  carvedilol (COREG) 25 MG tablet Take 25 mg by mouth 2 (two) times daily. 06/26/21  Yes [provider]  Cholecalciferol (VITAMIN D3 PO) Take 1 tablet by  mouth daily.   Yes [provider]  mirtazapine (REMERON) 15 MG tablet Take 15 mg by mouth every evening. 07/26/21  Yes [provider]  Multiple Vitamin (MULTIVITAMIN WITH MINERALS) TABS tablet Take 1 tablet by mouth daily. 05/10/20  Yes Regalado, Belkys A, MD  risperiDONE (RISPERDAL) 1 MG tablet Take 1 mg by mouth daily.   Yes [provider]  vitamin E 400 UNIT capsule Take 400 Units by mouth every morning.   Yes [provider]  tiotropium (SPIRIVA) 18 MCG inhalation capsule Place 1 capsule (18 mcg total) into inhaler and inhale daily. Patient not taking: No sig reported 02/20/12 02/19/13  04/21/12, MD     Critical care time: n/a  Melody Comas, MD [facility] Office: [phone]   See Amion for personal pager PCCM on call pager [phone] until 7pm. Please call Elink 7p-7a. [phone]

## 2021-09-04 NOTE — Progress Notes (Addendum)
PROGRESS NOTE                                                                                                                                                                                                             Patient Demographics:    Alan [name], is a 75 y.o. male, DOB - May 17, 1946, ZOX:[id number]  Outpatient Primary MD for the patient is Alan Banner, MD    LOS - 2  Admit date - 09-17-21    Chief Complaint  Patient presents with   Fall       Brief Narrative (HPI from H&P)  -  Alan [name] is a 75 y.o. male with medical history significant of HTN, PTSD, schizophrenia, prior stroke, patient lives alone and is under the supervision of his nephew who visits him almost daily, according to the nephew he has been gradually getting weak and losing weight over the last several months, day of  the admission he found the patient on the floor, this is happened a few times prior to admission recently, he was brought to the hospital where in the ER work-up showed hypotension, dehydration and shortness of breath along with severe cachexia, L-spine fractures failure to thrive and he was admitted for further care   Subjective:   Patient in bed in mild shortness of breath, quite confused, denies any chest pain or headache.   Assessment  & Plan :     Severe protein calorie malnutrition with cachexia, failure to thrive, multiple falls with acute T9-L2 fractures- for now supportive care which includes protein supplementation, PT OT, pain control, hydration with IV fluids, his BMI is 14 and he appears extremely wasted and I do not think he will benefit from any surgical procedure at this time.  Goal of care will be medical treatment directed towards comfort, she had neurosurgery input, continue supportive care.  There is no sepsis.  Note this patient is severely cachectic and malnourished with a BMI of 14, will cannot be  discharged home alone with L-spine fractures and multiple falls, will definitely require SNF.  Goal of care is gentle medical treatment directed towards comfort.  If any further decline focus on full comfort.  Palliative care following, DNR.  Plan discussed with nephew.   2.  Mild to moderate bilateral pleural effusion.  No symptoms at this time,  supplemental oxygen this likely is third spacing due to severe protein deficit or could be malignant, he is extremely cachectic and frail with severe protein calorie malnutrition.  Goal of care is to keep him comfortable, will discuss with pulmonary to see if a therapeutic left-sided paracentesis would help his breathing. Gentle Lasix for now.  3.  AMS.  Underlying vascular dementia, mild decline due to acute illness.  Supportive care.  Avoid benzodiazepines and narcotics.  4.  Schizophrenia.  Continue risperidone.  At risk for delirium.  Minimize narcotics benzodiazepines.  As needed Haldol  5.  Bowel impaction.  Disimpacted and bowel regimen.  6.  Hypertension.  Blood pressure has improved continue Norvasc, Coreg was stopped as he had sinus pauses.  Not a candidate for pacemaker.  7.  Elevated TSH.  Sick euthyroid syndrome, repeat in 4 weeks.  8.  Hypokalemia.  Replaced.       Condition - Extremely Guarded  Family Communication  :  Alan [name] [phone] called 09/02/20 at 8:10 am - DNR, 09/03/21  Code Status :  DNR  Consults  :  Pall.Care, N Surg, PCCM for therapeutic left-sided paracentesis if deemed beneficial by them.  PUD Prophylaxis :     Procedures  :     CT Abd - Pelvis - 1. Acute comminuted compression fracture at L2 with loss of vertebral body height of 70% centrally. There is a retropulsed element resulting in mild spinal canal stenosis. 2. Compression fracture in the superior endplate at L1 which appears acute with loss of vertebral body height centrally of approximately 75%. There is a slight retropulsed element resulting in  mild spinal canal stenosis. 3. Compression deformities at T9 and L4, indeterminate in age. 4. Large amount of stool in the colon and rectum. The rectum is markedly distended suggesting possibility of fecal impaction. 5. Small right pleural effusion and moderate left pleural effusion with atelectasis or infiltrate at the lung bases. 6. Bilateral renal cysts. 7. Multiple scattered bladder diverticula.   CTA Lungs -  No evidence of pulmonary embolism. Moderate left and small right pleural effusions. Associated bilateral lower lobe atelectasis. No frank interstitial edema. Mild superior endplate compression fracture deformity at T7. Moderate compression fracture deformity at T9 with mild retropulsion. These are both age indeterminate. Aortic Atherosclerosis (ICD10-I70.0).  CT Head and C Spine -   Head : No evidence of acute infarction, hemorrhage, hydrocephalus, extra-axial collection or mass lesion/mass effect. Global parenchymal volume loss, advanced for age. Encephalomalacia in the RIGHT ACA distribution. Encephalomalacia of the LEFT parietal lobe. Periventricular white matter hypodensities consistent with sequela of chronic microvascular ischemic disease. Vascular: Vascular calcifications.   C Spine -  1. New widening of the LEFT facet of C4-5 with apparent erosive changes along the facet. Differential considerations include osteomyelitis, neoplasm, inflammatory process or sequela of trauma. Recommend further dedicated evaluation with contrasted enhanced MRI cervical spine. 2.  No acute intracranial abnormality. 3. 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 There has been progressive extension of the soft tissue mass to the ossicles. This could be further assessed with dedicated brain MRI.      Disposition Plan  :    Status is: Observation  DVT Prophylaxis  :  Lovenox    Lab Results  Component Value Date   PLT 154 09/04/2021    Diet :   Diet Order             DIET SOFT Room service appropriate? Yes;  Fluid consistency: Thin  Diet effective now                    Inpatient Medications  Scheduled Meds:  (feeding supplement) PROSource Plus  30 mL Oral BID BM   amLODipine  10 mg Oral Daily   cholecalciferol  1,000 Units Oral Daily   collagenase   Topical Daily   docusate sodium  200 mg Oral BID   enoxaparin (LOVENOX) injection  20 mg Subcutaneous Q24H   feeding supplement  237 mL Oral TID BM   furosemide  20 mg Intravenous Once   milk and molasses  1 enema Rectal Once   mirtazapine  15 mg Oral QPM   multivitamin with minerals  1 tablet Oral Daily   potassium chloride  20 mEq Oral Once   risperiDONE  1 mg Oral Daily   vitamin E  400 Units Oral q morning   Continuous Infusions:  sodium chloride 10 mL/hr at 09/04/21 0539   cefTRIAXone (ROCEPHIN)  IV 1 g (09/04/21 0540)   PRN Meds:.acetaminophen **OR** acetaminophen, albuterol, haloperidol lactate, hydrALAZINE, ondansetron **OR** ondansetron (ZOFRAN) IV  Time Spent in minutes  30   Susa Raring M.D on 09/04/2021 at 10:47 AM  To page go to [email or web address]   [facility] -  Office  [phone]  See all Orders from today for further details    Objective:   Vitals:   09/04/21 0119 09/04/21 0120 09/04/21 0325 09/04/21 0728  BP:   (!) 109/49 107/63  Pulse: 60  60 61  Resp: 20  15 19   Temp:   98 F (36.7 C) 98.5 F (36.9 C)  TempSrc:   Axillary Axillary  SpO2: 90% 98% 100% 98%  Weight:      Height:        Wt Readings from Last 3 Encounters:  09/03/21 41.3 kg  05/11/20 58 kg  02/20/12 43.3 kg     Intake/Output Summary (Last 24 hours) at 09/04/2021 1047 Last data filed at 09/04/2021 09/06/2021 Gross per 24 hour  Intake 240 ml  Output 2350 ml  Net -2110 ml     Physical Exam  Extremely frail and cachectic elderly white gentleman laying in hospital bed in no distress, alert x 1 [place].AT,PERRAL Supple Neck, No JVD,   Symmetrical Chest  wall movement, reduced left sided breath sounds RRR,No Gallops, Rubs or new Murmurs,  +ve B.Sounds, Abd Soft, No tenderness,   No Cyanosis, Clubbing or edema       RN pressure injury documentation: Pressure Injury 05/06/20 Sacrum Mid Stage 2 -  Partial thickness loss of dermis presenting as a shallow open injury with a red, pink wound bed without slough. Partial loss of dermis,shallow red wound bed with scant serous drainage. (Active)  05/06/20 1600  Location: Sacrum  Location Orientation: Mid  Staging: Stage 2 -  Partial thickness loss of dermis presenting as a shallow open injury with a red, pink wound bed without slough.  Wound Description (Comments): Partial loss of dermis,shallow red wound bed with scant serous drainage.  Present on Admission: Yes     Pressure Injury 09/02/21 Hip Left Unstageable - Full thickness tissue loss in which the base of the injury is covered by slough (yellow, tan, gray, green or brown) and/or eschar (tan, brown or black) in the wound bed. 2x4 cm with black area at the center (Active)  09/02/21 1229  Location: Hip (left hip area)  Location Orientation: Left  Staging: Unstageable - Full thickness tissue loss  in which the base of the injury is covered by slough (yellow, tan, gray, green or brown) and/or eschar (tan, brown or black) in the wound bed.  Wound Description (Comments): 2x4 cm with black area at the center of the pressure wound  Present on Admission: Yes     Data Review:    CBC Recent Labs  Lab 08/22/2021 1839 08/31/2021 1845 09/02/21 0456 09/03/21 0129 09/04/21 0131  WBC 12.7*  --  9.2 9.2 10.4  HGB 11.5* 11.2* 11.0* 11.5* 11.6*  HCT 34.2* 33.0* 32.9* 34.2* 33.5*  PLT 192  --  167 174 154  MCV 90.7  --  91.9 91.4 90.8  MCH 30.5  --  30.7 30.7 31.4  MCHC 33.6  --  33.4 33.6 34.6  RDW 14.1  --  13.9 13.6 13.8  LYMPHSABS 0.6*  --   --  0.6* 0.9  MONOABS 0.8  --   --  0.7 0.9  EOSABS 0.0  --   --  0.1 0.0  BASOSABS 0.0  --   --  0.0 0.0     Electrolytes Recent Labs  Lab 09/16/2021 1839 08/23/2021 1845 08/28/2021 2023 09/02/21 0456 09/03/21 0129 09/04/21 0131 09/04/21 0756  NA  --  135 135 133* 134* 138  --   K  --  3.5 3.3* 3.1* 3.4* 3.4*  --   CL  --  93* 94* 94* 97* 98  --   CO2  --   --  32 32 31 29  --   GLUCOSE  --  114* 119* 98 112* 88  --   BUN  --  34* 24* 18 21 21   --   CREATININE  --  0.50* 0.58* 0.42* 0.47* 0.44*  --   CALCIUM  --   --  8.5* 7.6* 8.0* 8.1*  --   AST  --   --  29 27 24 25   --   ALT  --   --  21 18 19 20   --   ALKPHOS  --   --  145* 131* 122 117  --   BILITOT  --   --  0.6 0.6 0.4 0.6  --   ALBUMIN  --   --  2.7* 2.4* 2.4* 2.4*  --   MG  --   --  1.7  --  1.9 1.8  --   PROCALCITON  --   --   --  <0.10  --   --  <0.10  LATICACIDVEN 1.1  --  1.1  --   --   --   --   INR 1.1  --  1.1  --   --   --   --   TSH 10.634*  --   --   --  12.213*  12.539*  --   --   BNP  --   --   --   --  461.0*  --  315.9*    ------------------------------------------------------------------------------------------------------------------ No results for input(s): CHOL, HDL, LDLCALC, TRIG, CHOLHDL, LDLDIRECT in the last 72 hours.  No results found for: HGBA1C  Recent Labs    08/24/2021 1839 09/03/21 0129  TSH 10.634* 12.213*  12.539*  T3FREE 1.7*  --    ------------------------------------------------------------------------------------------------------------------ ID Labs Recent Labs  Lab 08/24/2021 1839 09/02/2021 1845 09/12/2021 2023 09/02/21 0456 09/03/21 0129 09/04/21 0131 09/04/21 0756  WBC 12.7*  --   --  9.2 9.2 10.4  --   PLT 192  --   --  167 174  154  --   PROCALCITON  --   --   --  <0.10  --   --  <0.10  LATICACIDVEN 1.1  --  1.1  --   --   --   --   CREATININE  --  0.50* 0.58* 0.42* 0.47* 0.44*  --    Cardiac Enzymes No results for input(s): CKMB, TROPONINI, MYOGLOBIN in the last 168 hours.  Invalid input(s): CK    Radiology Reports CT ABDOMEN PELVIS WO CONTRAST  Result Date:  09/02/2021 CLINICAL DATA:  Abdominal trauma. EXAM: CT ABDOMEN AND PELVIS WITHOUT CONTRAST TECHNIQUE: Multidetector CT imaging of the abdomen and pelvis was performed following the standard protocol without IV contrast. COMPARISON:  09/02/2021 FINDINGS: Lower chest: The heart is enlarged and multi-vessel coronary artery calcifications are noted. There is a moderate left pleural effusion and a small right pleural effusion with patchy atelectasis or infiltrate at the lung bases. Hepatobiliary: A few scattered hypodensities are present in the liver, most likely representing cysts or hemangioma is. No biliary ductal dilatation. The gallbladder is not visualized on exam. Pancreas: Within normal limits on noncontrast exam. Spleen: Normal in size without focal abnormality. Adrenals/Urinary Tract: There is thickening of the right adrenal gland without evidence of discrete nodule. The left adrenal gland is not well seen. Excreted contrast is noted in the kidneys bilaterally. There are bilateral renal cysts, the largest on the left measuring 10.9 cm. No hydronephrosis. Contrast is present in the urinary bladder and multiple bladder diverticula are noted. Stomach/Bowel: The stomach is nondistended. No bowel obstruction, free air or pneumatosis. A large amount of stool is present in the colon and rectum. The rectum is markedly distended measuring up to 10.2 cm in diameter. The appendix is not visualized on exam due to paucity of intra-abdominal fat. There is limited evaluation for bowel wall thickening. Vascular/Lymphatic: There is heavy atherosclerotic calcification of the aorta without evidence of aneurysm. No abdominal or pelvic lymphadenopathy. Reproductive: The prostate gland is normal in size and contains coarse calcifications. Other: No free fluid. Musculoskeletal: There is diffusely decreased mineralization of the bones. Compression deformities are present at T9 slightly retropulsed element resulting in no significant  spinal canal stenosis. There is an acute compression fracture in the superior endplate of L1 with loss of vertebral body height of approximately 75% in slightly retropulsed element with mild spinal canal stenosis. There is an acute comminuted compression fracture at L2 with a retropulsed element and loss of vertebral body height of approximately 70% centrally, resulting in mild spinal canal stenosis. There is a compression fracture in the superior endplate at L4 with no significant retropulsed element. IMPRESSION: 1. Acute comminuted compression fracture at L2 with loss of vertebral body height of 70% centrally. There is a retropulsed element resulting in mild spinal canal stenosis. 2. Compression fracture in the superior endplate at L1 which appears acute with loss of vertebral body height centrally of approximately 75%. There is a slight retropulsed element resulting in mild spinal canal stenosis. 3. Compression deformities at T9 and L4, indeterminate in age. 4. Large amount of stool in the colon and rectum. The rectum is markedly distended suggesting possibility of fecal impaction. 5. Small right pleural effusion and moderate left pleural effusion with atelectasis or infiltrate at the lung bases. 6. Bilateral renal cysts. 7. Multiple scattered bladder diverticula. Electronically Signed   By: Thornell Sartorius M.D.   On: 09/02/2021 03:38   DG Chest 1 View  Result Date: 09/18/2021 CLINICAL DATA:  Fall, pain.  EXAM: CHEST  1 VIEW COMPARISON:  Chest x-ray 05/06/2020. FINDINGS: There are atherosclerotic calcifications throughout the aorta. Cardiac silhouette is within normal limits. There are bands of atelectasis in the right lung base and left mid lung. There are patchy opacities in the left lung base with small left pleural effusion. There is no pneumothorax. No acute fractures are seen. IMPRESSION: 1. Small left pleural effusion with left basilar atelectasis/airspace disease. Electronically Signed   By: Darliss Cheney M.D.   On: 08/21/2021 20:00   DG Pelvis 1-2 Views  Result Date: 09/04/2021 CLINICAL DATA:  Fall EXAM: PELVIS - 1-2 VIEW COMPARISON:  Radiograph dated December 23rd 2011 FINDINGS: No definite displaced fracture. Evaluation is somewhat limited due to patient rotation. Limited evaluation of the bilateral iliac bones and sacrum due to a large amount of overlying bowel gas. Vascular calcifications. IMPRESSION: No definite displaced fracture. Exam is limited by patient rotation, osteopenia and large amount of of gas overlying the bilateral iliac bones and sacrum. Electronically Signed   By: Allegra Lai M.D.   On: 08/22/2021 20:05   CT HEAD WO CONTRAST  Result Date: 08/25/2021 CLINICAL DATA:  Head trauma, minor (Age >= 65y); Neck trauma (Age >= 65y) EXAM: CT HEAD WITHOUT CONTRAST CT CERVICAL SPINE WITHOUT CONTRAST TECHNIQUE: Multidetector CT imaging of the head and cervical spine was performed following the standard protocol without intravenous contrast. Multiplanar CT image reconstructions of the cervical spine were also generated. COMPARISON:  May 06, 2020 FINDINGS: CT HEAD FINDINGS Brain: No evidence of acute infarction, hemorrhage, hydrocephalus, extra-axial collection or mass lesion/mass effect. Global parenchymal volume loss, advanced for age. Encephalomalacia in the RIGHT ACA distribution. Encephalomalacia of the LEFT parietal lobe. Periventricular white matter hypodensities consistent with sequela of chronic microvascular ischemic disease. Vascular: Vascular calcifications. Skull: No acute fracture. Sinuses/Orbits: Revisualization of a soft tissue mass of the RIGHT mastoid with extension into the middle ear with progressive erosive changes of the ossicles. RIGHT mastoid effusion, similar in comparison to prior. Other: LEFT ear cerumen. CT CERVICAL SPINE FINDINGS Alignment: Normal. Skull base and vertebrae: There is new irregular erosive change along the superior margin of the LEFT facet of  C5 with the loose sclerotic osseous fragment noted in the facet space (series 9, image 32). Mild erosive irregularity of the inferior aspect of the LEFT facet of C4 (series 8, image 44). Soft tissues and spinal canal: No prevertebral fluid or swelling. No visible canal hematoma. Disc levels:  Mild uncovertebral hypertrophy at C3-4. Upper chest: Paraseptal and centrilobular emphysema. Unchanged subcentimeter RIGHT thyroid nodule for which no specific follow-up is recommended. Other: Severe atherosclerotic calcifications of the carotid arteries. IMPRESSION: 1. New widening of the LEFT facet of C4-5 with apparent erosive changes along the facet. Differential considerations include osteomyelitis, neoplasm, inflammatory process or sequela of trauma. Recommend further dedicated evaluation with contrasted enhanced MRI cervical spine. 2.  No acute intracranial abnormality. 3. 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 There has been progressive extension of the soft tissue mass to the ossicles. This could be further assessed with dedicated brain MRI. Electronically Signed   By: Meda Klinefelter M.D.   On: 09/09/2021 19:34   CT Angio Chest PE W and/or Wo Contrast  Result Date: 09/02/2021 CLINICAL DATA:  Hypoxia EXAM: CT ANGIOGRAPHY CHEST WITH CONTRAST TECHNIQUE: Multidetector CT imaging of the chest was performed using the standard protocol during bolus administration of intravenous contrast. Multiplanar CT image reconstructions and MIPs were obtained to  evaluate the vascular anatomy. CONTRAST:  75mL OMNIPAQUE IOHEXOL 350 MG/ML SOLN COMPARISON:  Chest radiograph dated 09-02-21 FINDINGS: Cardiovascular: Satisfactory opacification the bilateral pulmonary arteries to the segmental level. No evidence of pulmonary embolism. Although not tailored for evaluation of the thoracic aorta, there is no evidence thoracic aortic aneurysm or dissection. Atherosclerotic  calcifications of the aortic arch. The heart is normal in size.  No pericardial effusion. Three vessel coronary atherosclerosis. Mediastinum/Nodes: No suspicious mediastinal lymphadenopathy. Visualized thyroid is notable for an 11 mm right thyroid nodule (series 5/image 27). Not clinically significant; no follow-up imaging recommended (ref: J Am Coll Radiol. 2015 Feb;12(2): 143-50). Lungs/Pleura: No suspicious pulmonary nodules. Moderate left and small right pleural effusions. No frank interstitial edema. Associated bilateral lower lobe atelectasis. No focal consolidation. No pneumothorax. Upper Abdomen: Visualized upper abdomen is notable for bilateral renal cysts and vascular calcifications. Musculoskeletal: Mild superior endplate compression fracture deformity at T7. Moderate compression fracture deformity at T9 with minimal retropulsion. These are both age indeterminate. Review of the MIP images confirms the above findings. IMPRESSION: No evidence of pulmonary embolism. Moderate left and small right pleural effusions. Associated bilateral lower lobe atelectasis. No frank interstitial edema. Mild superior endplate compression fracture deformity at T7. Moderate compression fracture deformity at T9 with mild retropulsion. These are both age indeterminate. Aortic Atherosclerosis (ICD10-I70.0). Electronically Signed   By: Charline Bills M.D.   On: 09/02/2021 01:49   CT CERVICAL SPINE WO CONTRAST  Result Date: 09/02/21 CLINICAL DATA:  Head trauma, minor (Age >= 65y); Neck trauma (Age >= 65y) EXAM: CT HEAD WITHOUT CONTRAST CT CERVICAL SPINE WITHOUT CONTRAST TECHNIQUE: Multidetector CT imaging of the head and cervical spine was performed following the standard protocol without intravenous contrast. Multiplanar CT image reconstructions of the cervical spine were also generated. COMPARISON:  May 06, 2020 FINDINGS: CT HEAD FINDINGS Brain: No evidence of acute infarction, hemorrhage, hydrocephalus, extra-axial  collection or mass lesion/mass effect. Global parenchymal volume loss, advanced for age. Encephalomalacia in the RIGHT ACA distribution. Encephalomalacia of the LEFT parietal lobe. Periventricular white matter hypodensities consistent with sequela of chronic microvascular ischemic disease. Vascular: Vascular calcifications. Skull: No acute fracture. Sinuses/Orbits: Revisualization of a soft tissue mass of the RIGHT mastoid with extension into the middle ear with progressive erosive changes of the ossicles. RIGHT mastoid effusion, similar in comparison to prior. Other: LEFT ear cerumen. CT CERVICAL SPINE FINDINGS Alignment: Normal. Skull base and vertebrae: There is new irregular erosive change along the superior margin of the LEFT facet of C5 with the loose sclerotic osseous fragment noted in the facet space (series 9, image 32). Mild erosive irregularity of the inferior aspect of the LEFT facet of C4 (series 8, image 44). Soft tissues and spinal canal: No prevertebral fluid or swelling. No visible canal hematoma. Disc levels:  Mild uncovertebral hypertrophy at C3-4. Upper chest: Paraseptal and centrilobular emphysema. Unchanged subcentimeter RIGHT thyroid nodule for which no specific follow-up is recommended. Other: Severe atherosclerotic calcifications of the carotid arteries. IMPRESSION: 1. New widening of the LEFT facet of C4-5 with apparent erosive changes along the facet. Differential considerations include osteomyelitis, neoplasm, inflammatory process or sequela of trauma. Recommend further dedicated evaluation with contrasted enhanced MRI cervical spine. 2.  No acute intracranial abnormality. 3. 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 There has been progressive extension of the soft tissue mass to the ossicles. This could be further assessed with dedicated brain MRI. Electronically Signed   By: Meda Klinefelter M.D.  On: 09/10/2021 19:34   MR  BRAIN WO CONTRAST  Result Date: 09/02/2021 CLINICAL DATA:  Initial evaluation for neuro deficit, stroke suspected./ EXAM: MRI HEAD WITHOUT CONTRAST TECHNIQUE: Multiplanar, multiecho pulse sequences of the brain and surrounding structures were obtained without intravenous contrast. COMPARISON:  Prior CT from 08/31/2021. FINDINGS: Brain: Diffuse prominence of the CSF containing spaces compatible with generalized cerebral atrophy, fairly advanced in nature. Scattered patchy T2/FLAIR hyperintensity involving the periventricular deep white matter both cerebral hemispheres most consistent with chronic small vessel ischemic disease, moderate in nature. Few scattered remote lacunar infarcts present about the hemispheric cerebral white matter. Scattered areas of encephalomalacia and gliosis involving the parasagittal right frontal and parietal lobes, consistent with prior ischemic infarct. Additional remote infarct noted at the left parietal lobe. Few areas of associated chronic hemosiderin staining noted about these areas of ischemia. Associated wallerian degeneration with atrophy noted at the right cerebral peduncle/midbrain. No abnormal foci of restricted diffusion to suggest acute or subacute ischemia. Gray-white matter differentiation otherwise maintained. No evidence for acute intracranial hemorrhage. No mass lesion, midline shift or mass effect. Diffuse ventricular prominence related to global parenchymal volume loss of hydrocephalus. No extra-axial fluid collection. Pituitary gland suprasellar region within normal limits. Midline structures intact. Vascular: Abnormal flow voids within both internal carotid arteries to the termini, consistent with previously identified occlusion. Major intracranial vascular flow voids are otherwise maintained. Skull and upper cervical spine: Craniocervical junction within normal limits. Bone marrow signal intensity normal. No scalp soft tissue abnormality. Sinuses/Orbits: Globes  and orbital soft tissues within normal limits. Paranasal sinuses are largely clear. 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 Associated chronic appearing right mastoid effusion. Other: None. IMPRESSION: 1. No acute intracranial infarct or other abnormality. 2. 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 3. 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 4. Advanced cerebral atrophy with moderate chronic microvascular ischemic disease. 5. 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 Electronically Signed   By: Rise Mu M.D.   On: 09/02/2021 03:16   MR CERVICAL SPINE WO CONTRAST  Result Date: 09/02/2021 CLINICAL DATA:  Initial evaluation for chronic neck pain, erosive changes at left C4-5 facet. EXAM: MRI CERVICAL SPINE WITHOUT CONTRAST TECHNIQUE: Multiplanar, multisequence MR imaging of the cervical spine was performed. No intravenous contrast was administered. COMPARISON:  Prior CT from 09/13/2021. FINDINGS: Alignment: Examination moderately to severely degraded by motion artifact. Exaggeration of the normal cervical lordosis. 2 mm retrolisthesis of C3 on C4, with trace anterolisthesis of C4 on C5. Findings presumably chronic and facet mediated. Vertebrae: Vertebral body height maintained without acute or chronic fracture. Bone marrow signal intensity within normal limits. No worrisome osseous lesions. Mild discogenic reactive endplate change present about the C4-5 interspace. Additionally, there is relatively mild edema about the left C4-5 facet, corresponding with abnormality on prior CT. Overall, appearance on MRI felt to be most consistent with  degenerative facet arthritis. No significantly increased edema or surrounding inflammatory changes as is typically seen with septic arthritis. No other abnormal marrow edema. Cord: Signal intensity within the cervical spinal cord grossly within normal limits on this motion degraded exam. Posterior Fossa, vertebral arteries, paraspinal tissues: Craniocervical junction normal. Paraspinous and prevertebral soft tissues grossly within normal limits on this motion  degraded exam. Normal flow voids seen within the vertebral arteries bilaterally. Disc levels: C2-C3: Negative interspace. Mild right-sided facet hypertrophy. No significant spinal stenosis. Foramina remain patent. C3-C4: Trace retrolisthesis with mild degenerative intervertebral disc space narrowing. Broad posterior disc protrusion flattens and partially faces the ventral thecal sac, eccentric to the right. Superimposed facet and ligament flavum hypertrophy. Resultant moderate spinal stenosis with mild cord flattening, but no visible cord signal changes. Moderate right C4 foraminal stenosis. Left neural foramen appears grossly patent. C4-C5: Mild degenerative intervertebral disc space narrowing with diffuse disc bulge and bilateral uncovertebral spurring. Bilateral facet arthrosis with mild ligament flavum hypertrophy. Resultant moderate spinal stenosis with mild cord flattening, but no visible cord signal changes. Moderate right with mild left C5 foraminal narrowing. C5-C6: Mild degenerative intervertebral disc space narrowing with diffuse disc bulge and bilateral uncovertebral spurring. Broad posterior disc osteophyte flattens and partially faces the ventral thecal sac with resultant mild spinal stenosis. Severe right with moderate left C6 foraminal narrowing. C6-C7: Minimal annular disc bulge. Advanced left with mild right facet arthrosis. No spinal stenosis. Foramina appear grossly patent. C7-T1:  Negative interspace.  No canal or foraminal stenosis.  Visualized upper thoracic spine demonstrates no significant finding. IMPRESSION: 1. Motion degraded exam. 2. Mild edema about the left C4-5 facet, corresponding with abnormality on prior CT. Overall, appearance is felt to be most consistent with degenerative facet arthritis. No significantly increased edema or surrounding inflammatory changes as is typically seen with septic arthritis. Correlation with symptomatology and laboratory values recommended. 3. Multilevel cervical spondylosis with resultant mild to moderate spinal stenosis at C3-4 through C5-6 as above. Associated moderate right C4 and C5 foraminal stenosis, with severe right and moderate left C6 foraminal narrowing. Electronically Signed   By: Rise Mu M.D.   On: 09/02/2021 03:28   MR THORACIC SPINE WO CONTRAST  Result Date: 09/02/2021 CLINICAL DATA:  Compression fracture. EXAM: MRI THORACIC AND LUMBAR SPINE WITHOUT CONTRAST TECHNIQUE: Multiplanar and multiecho pulse sequences of the thoracic and lumbar spine were obtained without intravenous contrast. COMPARISON:  None. FINDINGS: MRI THORACIC SPINE FINDINGS Alignment: Exaggerated thoracic kyphosis. No traumatic malalignment. Vertebrae: Superior endplate fractures of T5 and T7 with mild height loss and no marrow edema. Compression fractures of T9 and T11 with edematous signal that is confluent at T9 and moderately extensive at T11. Mild retropulsion at both levels with mild spinal canal narrowing at T9 where the cord is deflected but not compressed. Diffuse involvement of the T9 body is notable, but no paravertebral mass or posterior element involvement to imply underlying pathologic lesion. Remote T12 compression fracture with mild superior endplate depression. Cord:  No cord edema or spinal canal hematoma. Paraspinal and other soft tissues: Layering pleural effusions, greater on the left where volume is small to moderate. Very large left renal cyst Disc levels: No significant  degenerative changes or degenerative cord impingement. MRI LUMBAR SPINE FINDINGS Segmentation:  5 lumbar type vertebrae Alignment:  No traumatic malalignment Vertebrae: Compression fractures of L1 and L2 with marrow edema and fluid-filled fracture planes. Height loss at both levels is advanced. L4 compression fracture with mild depression of the superior endplate. Marrow edema at this level is mild. Retropulsion at L2 and L1 without conus compression. Conus medullaris and cauda equina: Conus extends to the L1-2 level. Conus and cauda equina appear normal. Paraspinal and other soft tissues: Very large left renal cyst. Persistent stool distended rectum. Disc levels: Disc desiccation and fissuring at L4-5 and L5-S1. IMPRESSION: MR THORACIC SPINE IMPRESSION 1.  Acute/unhealed compression fractures at T9 and T11. Retropulsion at T9 causes mild spinal stenosis and cord deviation. 2. Remote compression fractures of T5, T7, and T12. 3. Layering pleural effusions greater on the left. MR LUMBAR SPINE IMPRESSION 1. Acute/unhealed compression fractures at L1 and L2 with fluid-filled fracture clefts and advanced height loss. Mild retropulsion at both levels. 2. L4 superior endplate fracture with mild height loss, subacute in appearance. Electronically Signed   By: Tiburcio Pea M.D.   On: 09/02/2021 10:29   MR LUMBAR SPINE WO CONTRAST  Result Date: 09/02/2021 CLINICAL DATA:  Compression fracture. EXAM: MRI THORACIC AND LUMBAR SPINE WITHOUT CONTRAST TECHNIQUE: Multiplanar and multiecho pulse sequences of the thoracic and lumbar spine were obtained without intravenous contrast. COMPARISON:  None. FINDINGS: MRI THORACIC SPINE FINDINGS Alignment: Exaggerated thoracic kyphosis. No traumatic malalignment. Vertebrae: Superior endplate fractures of T5 and T7 with mild height loss and no marrow edema. Compression fractures of T9 and T11 with edematous signal that is confluent at T9 and moderately extensive at T11. Mild retropulsion  at both levels with mild spinal canal narrowing at T9 where the cord is deflected but not compressed. Diffuse involvement of the T9 body is notable, but no paravertebral mass or posterior element involvement to imply underlying pathologic lesion. Remote T12 compression fracture with mild superior endplate depression. Cord:  No cord edema or spinal canal hematoma. Paraspinal and other soft tissues: Layering pleural effusions, greater on the left where volume is small to moderate. Very large left renal cyst Disc levels: No significant degenerative changes or degenerative cord impingement. MRI LUMBAR SPINE FINDINGS Segmentation:  5 lumbar type vertebrae Alignment:  No traumatic malalignment Vertebrae: Compression fractures of L1 and L2 with marrow edema and fluid-filled fracture planes. Height loss at both levels is advanced. L4 compression fracture with mild depression of the superior endplate. Marrow edema at this level is mild. Retropulsion at L2 and L1 without conus compression. Conus medullaris and cauda equina: Conus extends to the L1-2 level. Conus and cauda equina appear normal. Paraspinal and other soft tissues: Very large left renal cyst. Persistent stool distended rectum. Disc levels: Disc desiccation and fissuring at L4-5 and L5-S1. IMPRESSION: MR THORACIC SPINE IMPRESSION 1. Acute/unhealed compression fractures at T9 and T11. Retropulsion at T9 causes mild spinal stenosis and cord deviation. 2. Remote compression fractures of T5, T7, and T12. 3. Layering pleural effusions greater on the left. MR LUMBAR SPINE IMPRESSION 1. Acute/unhealed compression fractures at L1 and L2 with fluid-filled fracture clefts and advanced height loss. Mild retropulsion at both levels. 2. L4 superior endplate fracture with mild height loss, subacute in appearance. Electronically Signed   By: Tiburcio Pea M.D.   On: 09/02/2021 10:29   DG Chest Port 1 View  Result Date: 09/04/2021 CLINICAL DATA:  Shortness of breath. EXAM:  PORTABLE CHEST 1 VIEW COMPARISON:  September 03, 2021. FINDINGS: Stable cardiomediastinal silhouette. Moderate size left pleural effusion is noted which is increased compared to prior exam. Associated left lung atelectasis is noted. Mild right pleural effusion is now noted with right basilar atelectasis. Bony thorax is unremarkable. IMPRESSION: Increased bilateral pleural effusions are noted with associated atelectasis, left greater than right. Aortic Atherosclerosis (ICD10-I70.0). Electronically Signed   By: Lupita Raider M.D.   On: 09/04/2021 08:16   DG Chest Port 1 View  Result Date: 09/03/2021 CLINICAL DATA:  Shortness of breath EXAM: PORTABLE CHEST 1 VIEW COMPARISON:  Chest radiograph 09/18/2021 FINDINGS: The cardiomediastinal silhouette is stable. There is unchanged dense calcified atherosclerotic  plaque throughout the thoracic aorta. There is a moderate size layering left pleural effusion, slightly increased in size since 09-28-21. Adjacent airspace disease likely reflects atelectasis. The left upper lung is clear. There is a trace right pleural effusion with adjacent linear opacities likely reflecting atelectasis. There is no other focal airspace disease on the right. There is no pneumothorax. There is no acute osseous abnormality. Deformity of the left humeral head is unchanged. There is gaseous distention of the large bowel in the imaged upper abdomen, unchanged. IMPRESSION: Moderate size left pleural effusion is increased in size since 2021-09-28. Trace right pleural effusion is not significantly changed. Adjacent airspace disease in the left lung base likely reflects atelectasis, though infection cannot be excluded. Electronically Signed   By: Lesia Hausen M.D.   On: 09/03/2021 13:21   DG Shoulder Left  Result Date: September 28, 2021 CLINICAL DATA:  Fall, left shoulder pain EXAM: LEFT SHOULDER - 2+ VIEW COMPARISON:  CT left shoulder dated 05/07/2020 FINDINGS: Left shoulder deformity related to  prior impacted femoral neck fracture. No acute fracture is seen. Visualized soft tissues are within normal limits. Visualized left lung is notable for a suspected small left pleural effusion. IMPRESSION: Left shoulder deformity related to prior impractical femoral neck fracture. No acute fracture is seen. Suspected small left pleural effusion. Electronically Signed   By: Charline Bills M.D.   On: 2021-09-28 20:02

## 2021-09-04 NOTE — Progress Notes (Signed)
Patient has been converted to total comfort care. Nasal cannula removed as well as telemetry and IV fluids. Scheduled Morphine given. Patient currently resting in bed in no acute distress. Brynda Rim, RN

## 2021-09-05 DIAGNOSIS — G9341 Metabolic encephalopathy: Secondary | ICD-10-CM | POA: Diagnosis not present

## 2021-09-05 DIAGNOSIS — I10 Essential (primary) hypertension: Secondary | ICD-10-CM | POA: Diagnosis not present

## 2021-09-05 DIAGNOSIS — S32000A Wedge compression fracture of unspecified lumbar vertebra, initial encounter for closed fracture: Secondary | ICD-10-CM | POA: Diagnosis not present

## 2021-09-05 DIAGNOSIS — K5900 Constipation, unspecified: Secondary | ICD-10-CM | POA: Diagnosis not present

## 2021-09-05 LAB — PROCALCITONIN: Procalcitonin: <0.1 ng/mL

## 2021-09-05 NOTE — Progress Notes (Signed)
PROGRESS NOTE    Alan [name]  LKG:[id number] DOB: 12/20/1945 DOA: 08/27/2021 PCP: Barbie Banner, MD    Brief Narrative:  Mr. Canupp was admitted to the hospital with the working diagnosis of severe calorie protein malnutrition with cachexia and multiple falls, acute T9 and L2 fractures.   75 year old male past medical history for hypertension, schizophrenia, history of CVA and PTSD who presented with generalized weakness and multiple falls.  Patient was found by his caregiver on the floor on 2 consecutive days, EMS was called and patient was brought to the hospital.  On his initial physical examination blood pressure 114/51, heart rate 63, respirate 24, oxygen saturation 90%, he was cachectic, ill looking appearing, he had decreased breath sounds at bases bilaterally, heart S1-S2, present, rhythmic, positive pressure ulcer left posterior hip.  Left lower extremity weakness and numbness.  Decreased strength 4/5 compared to the right.  Sodium 135, potassium 3.3, chloride 94, bicarb 32, glucose 119, BUN 24, creatinine 0.58, magnesium 1.7, CK1 190, white cell count 12.7, hemoglobin 9.5, hematocrit 34.2, platelets 192.  Lactic acid 1.1. SARS COVID-19 negative.  Urinalysis specific gravity 1.015, negative nitrates, negative leukocytes. Alcohol level less than 10.  Head/neck CT with left facet of C4-C5 erosive changes.  No acute intracranial changes.  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  Chest radiograph with bilateral atelectasis.  CT chest was negative for pulm embolism.  Moderate left and small right pleural effusion.  Associated bilateral lobe atelectasis.  Compression fracture T7,- T9.  EKG 67 bpm, normal axis, normal intervals, sinus rhythm, no significant ST segment or T wave changes.  Patient was found to have severe malnutrition, multiple spine compression fractures and failure to thrive. Goals of care were addressed, now directed  towards comfort care.  Assessment & Plan:   Principal Problem:   Compression fracture of lumbar vertebra, closed, initial encounter (HCC) Active Problems:   Generalized weakness   HTN (hypertension)   Severe protein-calorie malnutrition (HCC)   Schizophrenia (HCC)   Pressure injury of skin   Hypoxia   Pleural effusion   Constipation   Severe protein calorie malnutrition with cachexia and failure to thrive, multiple falls, T9 and L2 compression fractures.  Left hip stage 2 pressure ulcer present on admission  Patient with very poor prognosis, poor oral intake and rapid decline in his health. Now has been transitioned to comfort care. Life expectancy is less than 2 weeks, will call his family for further advance directives.   Constipation, has resolved, patient had impaction, Sick euthyroid syndrome, poor prognosis.   2. Acute hypoxemic respiratory failure, due to bilateral pleural effusions. Patient with no apparent dyspnea, or respiratory distress, considering his poor prognosis, and no distress, will plan to hold on thoracentesis.   3. Metabolic encephalopathy acute in the setting of dementia, continue supportive medical care and comfort measures.   4. Schizophrenia. Continue with risperidone and as needed haloperidol.   5. HTN continue blood pressure control with amlodipine, continue holding carvedilol to prevent bradycardia.   6. Hypokalemia electrolytes have been corrected.  Status is: Inpatient  Remains inpatient appropriate because: Pending transfer to SNF or hospice  DVT prophylaxis: Scd   Code Status:    DNR   Family Communication:    I spoke with her nephew and will consult transition of care for residential hospice.    Nutrition Status: Nutrition Problem: Severe Malnutrition Etiology: chronic illness (vascular dementia, esophageal stricture, stroke, schizophrenia) Signs/Symptoms: severe muscle depletion, severe fat  depletion Interventions: Ensure Enlive (each  supplement provides 350kcal and 20 grams of protein), Prostat, MVI, Hormel Shake    Consultants:  Pulmonary  Palliative Care   Subjective: Patient with no nausea or vomiting, no dyspnea or chest pain, very weak and deconditioned.   Objective: Vitals:   09/04/21 2028 09/04/21 2033 09/05/21 0319 09/05/21 0759  BP: (!) 83/35 (!) 108/92 (!) 102/46 (!) 132/95  Pulse: (!) 59  (!) 57 (!) 54  Resp: 20  18 18   Temp: 97.7 F (36.5 C)  97.6 F (36.4 C) (!) 97.5 F (36.4 C)  TempSrc: Oral  Oral Oral  SpO2: 96%  98% 96%  Weight:      Height:        Intake/Output Summary (Last 24 hours) at 09/05/2021 1036 Last data filed at 09/05/2021 0419 Gross per 24 hour  Intake 160.53 ml  Output 1200 ml  Net -1039.47 ml   Filed Weights   09/02/21 0348 09/02/21 1038 09/03/21 0602  Weight: 39.8 kg 39.6 kg 41.3 kg    Examination:   General:  deconditioned and ill looking appearing Neurology: Awake and alert, non focal, confused but not agitated.  E ENT: no pallor, no icterus, oral mucosa moist Cardiovascular: No JVD. S1-S2 present, rhythmic, no gallops, rubs, or murmurs. No lower extremity edema. Pulmonary: positive breath sounds bilaterally, adequate air movement, no wheezing, rhonchi or rales. Gastrointestinal. Abdomen soft and non tender Skin. No rashes Musculoskeletal: no joint deformities    Data Reviewed: I have personally reviewed following labs and imaging studies  CBC: Recent Labs  Lab 08/30/2021 1839 08/31/2021 1845 09/02/21 0456 09/03/21 0129 09/04/21 0131  WBC 12.7*  --  9.2 9.2 10.4  NEUTROABS 11.2*  --   --  7.8* 8.5*  HGB 11.5* 11.2* 11.0* 11.5* 11.6*  HCT 34.2* 33.0* 32.9* 34.2* 33.5*  MCV 90.7  --  91.9 91.4 90.8  PLT 192  --  167 174 [id number]   Basic Metabolic Panel: Recent Labs  Lab 09/17/2021 1845 09/05/2021 2023 09/02/21 0456 09/03/21 0129 09/04/21 0131  NA 135 135 133* 134* 138  K 3.5 3.3* 3.1* 3.4* 3.4*  CL 93* 94* 94* 97* 98  CO2  --  32 32 31 29  GLUCOSE  114* 119* 98 112* 88  BUN 34* 24* 18 21 21   CREATININE 0.50* 0.58* 0.42* 0.47* 0.44*  CALCIUM  --  8.5* 7.6* 8.0* 8.1*  MG  --  1.7  --  1.9 1.8   GFR: Estimated Creatinine Clearance: 45.2 mL/min (A) (by C-G formula based on SCr of 0.44 mg/dL (L)). Liver Function Tests: Recent Labs  Lab 09/09/2021 2023 09/02/21 0456 09/03/21 0129 09/04/21 0131  AST 29 27 24 25   ALT 21 18 19 20   ALKPHOS 145* 131* 122 117  BILITOT 0.6 0.6 0.4 0.6  PROT 5.4* 5.0* 5.0* 5.0*  ALBUMIN 2.7* 2.4* 2.4* 2.4*   No results for input(s): LIPASE, AMYLASE in the last 168 hours. No results for input(s): AMMONIA in the last 168 hours. Coagulation Profile: Recent Labs  Lab 09/03/2021 1839 09/13/2021 2023  INR 1.1 1.1   Cardiac Enzymes: Recent Labs  Lab 08/26/2021 2023  CKTOTAL 190   BNP (last 3 results) No results for input(s): PROBNP in the last 8760 hours. HbA1C: No results for input(s): HGBA1C in the last 72 hours. CBG: Recent Labs  Lab 09/03/21 1205 09/03/21 1629 09/03/21 2151  GLUCAP 157* 164* 97   Lipid Profile: No results for input(s): CHOL, HDL, LDLCALC, TRIG, CHOLHDL,  LDLDIRECT in the last 72 hours. Thyroid Function Tests: Recent Labs    09/03/21 0129  TSH 12.213*  12.539*  FREET4 1.23*   Anemia Panel: No results for input(s): VITAMINB12, FOLATE, FERRITIN, TIBC, IRON, RETICCTPCT in the last 72 hours.    Radiology Studies: I have reviewed all of the imaging during this hospital visit personally     Scheduled Meds:  collagenase   Topical Daily   docusate sodium  200 mg Oral BID   milk and molasses  1 enema Rectal Once   morphine CONCENTRATE  5 mg Oral Q6H   risperiDONE  1 mg Oral Daily   Continuous Infusions:  sodium chloride 10 mL/hr at 09/04/21 1500     LOS: 3 days        Dameon Soltis Gerome Apley, MD

## 2021-09-05 NOTE — Plan of Care (Signed)
  Problem: Clinical Measurements: Goal: Ability to maintain clinical measurements within normal limits will improve Outcome: Progressing Goal: Will remain free from infection Outcome: Progressing Goal: Cardiovascular complication will be avoided Outcome: Progressing   Problem: Activity: Goal: Risk for activity intolerance will decrease Outcome: Progressing   

## 2021-09-06 DIAGNOSIS — K5900 Constipation, unspecified: Secondary | ICD-10-CM | POA: Diagnosis not present

## 2021-09-06 DIAGNOSIS — S32000A Wedge compression fracture of unspecified lumbar vertebra, initial encounter for closed fracture: Secondary | ICD-10-CM | POA: Diagnosis not present

## 2021-09-06 DIAGNOSIS — R531 Weakness: Secondary | ICD-10-CM

## 2021-09-06 DIAGNOSIS — E43 Unspecified severe protein-calorie malnutrition: Secondary | ICD-10-CM | POA: Diagnosis not present

## 2021-09-06 DIAGNOSIS — G9341 Metabolic encephalopathy: Secondary | ICD-10-CM | POA: Diagnosis not present

## 2021-09-06 DIAGNOSIS — R627 Adult failure to thrive: Secondary | ICD-10-CM | POA: Diagnosis not present

## 2021-09-06 DIAGNOSIS — I10 Essential (primary) hypertension: Secondary | ICD-10-CM | POA: Diagnosis not present

## 2021-09-06 DIAGNOSIS — F209 Schizophrenia, unspecified: Secondary | ICD-10-CM

## 2021-09-06 MED ORDER — THIAMINE HCL 100 MG PO TABS
100.0000 mg | ORAL_TABLET | Freq: Every day | ORAL | Status: DC
  Administered 2021-09-06 – 2021-09-10 (×5): 100 mg via ORAL
  Filled 2021-09-06 (×5): qty 1

## 2021-09-06 MED ORDER — TAB-A-VITE/IRON PO TABS
1.0000 | ORAL_TABLET | Freq: Every day | ORAL | Status: DC
  Administered 2021-09-06 – 2021-09-10 (×5): 1 via ORAL
  Filled 2021-09-06 (×5): qty 1

## 2021-09-06 NOTE — Progress Notes (Signed)
PROGRESS NOTE    Alan [name]  [id number] DOB: 24-Aug-1946 DOA: 09/17/2021 PCP: Christain Sacramento, MD    Brief Narrative:  Alan [name] was admitted to the hospital with the working diagnosis of severe calorie protein malnutrition with cachexia and multiple falls, acute T9 and L2 fractures.    75 year old male past medical history for hypertension, schizophrenia, history of CVA and PTSD who presented with generalized weakness and multiple falls.  Patient was found by his caregiver on the floor on 2 consecutive days, EMS was called and patient was brought to the hospital.  On his initial physical examination blood pressure 114/51, heart rate 63, respirate 24, oxygen saturation 90%, he was cachectic, ill looking appearing, he had decreased breath sounds at bases bilaterally, heart S1-S2, present, rhythmic, positive pressure ulcer left posterior hip.  Left lower extremity weakness and numbness.  Decreased strength 4/5 compared to the right.   Sodium 135, potassium 3.3, chloride 94, bicarb 32, glucose 119, BUN 24, creatinine 0.58, magnesium 1.7, CK1 190, white cell count 12.7, hemoglobin 9.5, hematocrit 34.2, platelets 192.  Lactic acid 1.1. SARS COVID-19 negative.   Urinalysis specific gravity 1.015, negative nitrates, negative leukocytes. Alcohol level less than 10.   Head/neck CT with left facet of C4-C5 erosive changes.  No acute intracranial changes.  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   Chest radiograph with bilateral atelectasis.   CT chest was negative for pulm embolism.  Moderate left and small right pleural effusion.  Associated bilateral lobe atelectasis.  Compression fracture T7,- T9.   EKG 67 bpm, normal axis, normal intervals, sinus rhythm, no significant ST segment or T wave changes.   Patient was found to have severe malnutrition, multiple spine compression fractures and failure to thrive. Goals of care were addressed, now  directed towards comfort care.   Sick euthyroid syndrome.   Assessment & Plan:   Principal Problem:   Compression fracture of lumbar vertebra, closed, initial encounter ([place]) Active Problems:   Generalized weakness   HTN (hypertension)   Severe protein-calorie malnutrition (HCC)   Schizophrenia (HCC)   Pressure injury of skin   Hypoxia   Pleural effusion   Constipation     Severe protein calorie malnutrition with cachexia and failure to thrive, multiple falls, T9 and L2 compression fractures.  Left hip stage 2 pressure ulcer present on admission  Patient with very poor prognosis, poor oral intake and rapid decline in his health. Now has been transitioned to comfort care. Life expectancy is less than 2 weeks, will call his family for further advance directives.    Continue supportive medical care, nutritional supplements as tolerated.  Continue with soft diet.  Add thiamine and multivitamins.  Social work consult for residential hospice.    2. Acute hypoxemic respiratory failure, due to bilateral pleural effusions. Not candidate for thoracentesis.  Continue with oxymetry monitoring.    3. Metabolic encephalopathy acute in the setting of dementia, left hip unable to stage pressure skin injury present on admission  Patient is awake and alert, not in pain or dyspnea, continue to be very weak and deconditioned, poor oral intake.  Continue with local skin care.    4. Schizophrenia.  On risperidone  He has not used as needed haloperidol.    5. HTN  Continue with amlodipine for blood pressure control.   6. Hypokalemia  No further blood work.    Status is: Inpatient  Remains inpatient appropriate because: pending transfer to residential hospice  DVT prophylaxis: Scd   Code Status:    DNR   Family Communication:   No family at the bedside at the time of my examination      Nutrition Status: Nutrition Problem: Severe Malnutrition Etiology: chronic illness (vascular  dementia, esophageal stricture, stroke, schizophrenia) Signs/Symptoms: severe muscle depletion, severe fat depletion Interventions: Ensure Enlive (each supplement provides 350kcal and 20 grams of protein), Prostat, MVI, Hormel Shake     Consultants:  Palliative Care   Subjective: Patient is awake and alert, very weak and deconditioned, he is confused but not agitated.   Objective: Vitals:   09/05/21 0319 09/05/21 0759 09/05/21 2007 09/06/21 0739  BP: (!) 102/46 (!) 132/95 (!) 130/96 106/64  Pulse: (!) 57 (!) 54 (!) 55 (!) 59  Resp: 18 18 17 19   Temp: 97.6 F (36.4 C) (!) 97.5 F (36.4 C) 97.6 F (36.4 C) (!) 97.5 F (36.4 C)  TempSrc: Oral Oral Oral Oral  SpO2: 98% 96% 97% 94%  Weight:      Height:        Intake/Output Summary (Last 24 hours) at 09/06/2021 1429 Last data filed at 09/06/2021 0859 Gross per 24 hour  Intake 150 ml  Output --  Net 150 ml   Filed Weights   09/02/21 0348 09/02/21 1038 09/03/21 0602  Weight: 39.8 kg 39.6 kg 41.3 kg    Examination:   General: deconditioned and ill looking appearing  Neurology: Awake and alert, non focal  E ENT: mild pallor, no icterus, oral mucosa moist Cardiovascular: No JVD. S1-S2 present, rhythmic, no gallops, rubs, or murmurs. No lower extremity edema. Pulmonary: positive breath sounds bilaterally, adequate air movement, no wheezing, rhonchi or rales. Gastrointestinal. Abdomen soft and non tender Skin. No rashes Musculoskeletal: no joint deformities     Data Reviewed: I have personally reviewed following labs and imaging studies  CBC: Recent Labs  Lab 09/09/2021 1839 08/31/2021 1845 09/02/21 0456 09/03/21 0129 09/04/21 0131  WBC 12.7*  --  9.2 9.2 10.4  NEUTROABS 11.2*  --   --  7.8* 8.5*  HGB 11.5* 11.2* 11.0* 11.5* 11.6*  HCT 34.2* 33.0* 32.9* 34.2* 33.5*  MCV 90.7  --  91.9 91.4 90.8  PLT 192  --  167 174 154   Basic Metabolic Panel: Recent Labs  Lab 09/10/2021 1845 08/31/2021 2023 09/02/21 0456  09/03/21 0129 09/04/21 0131  NA 135 135 133* 134* 138  K 3.5 3.3* 3.1* 3.4* 3.4*  CL 93* 94* 94* 97* 98  CO2  --  32 32 31 29  GLUCOSE 114* 119* 98 112* 88  BUN 34* 24* 18 21 21   CREATININE 0.50* 0.58* 0.42* 0.47* 0.44*  CALCIUM  --  8.5* 7.6* 8.0* 8.1*  MG  --  1.7  --  1.9 1.8   GFR: Estimated Creatinine Clearance: 45.2 mL/min (A) (by C-G formula based on SCr of 0.44 mg/dL (L)). Liver Function Tests: Recent Labs  Lab 08/21/2021 2023 09/02/21 0456 09/03/21 0129 09/04/21 0131  AST 29 27 24 25   ALT 21 18 19 20   ALKPHOS 145* 131* 122 117  BILITOT 0.6 0.6 0.4 0.6  PROT 5.4* 5.0* 5.0* 5.0*  ALBUMIN 2.7* 2.4* 2.4* 2.4*   No results for input(s): LIPASE, AMYLASE in the last 168 hours. No results for input(s): AMMONIA in the last 168 hours. Coagulation Profile: Recent Labs  Lab 09/06/2021 1839 09/05/2021 2023  INR 1.1 1.1   Cardiac Enzymes: Recent Labs  Lab 09/14/2021 2023  CKTOTAL 190   BNP (  last 3 results) No results for input(s): PROBNP in the last 8760 hours. HbA1C: No results for input(s): HGBA1C in the last 72 hours. CBG: Recent Labs  Lab 09/03/21 1205 09/03/21 1629 09/03/21 2151  GLUCAP 157* 164* 97   Lipid Profile: No results for input(s): CHOL, HDL, LDLCALC, TRIG, CHOLHDL, LDLDIRECT in the last 72 hours. Thyroid Function Tests: No results for input(s): TSH, T4TOTAL, FREET4, T3FREE, THYROIDAB in the last 72 hours. Anemia Panel: No results for input(s): VITAMINB12, FOLATE, FERRITIN, TIBC, IRON, RETICCTPCT in the last 72 hours.    Radiology Studies: I have reviewed all of the imaging during this hospital visit personally     Scheduled Meds:  collagenase   Topical Daily   docusate sodium  200 mg Oral BID   milk and molasses  1 enema Rectal Once   morphine CONCENTRATE  5 mg Oral Q6H   risperiDONE  1 mg Oral Daily   Continuous Infusions:   LOS: 4 days        Alan [name] Gerome Apley, MD

## 2021-09-06 NOTE — Care Management Important Message (Signed)
Important Message  Patient Details  Name: Alan [name] MRN: [id number] Date of Birth: 1946/05/11   Medicare Important Message Given:  Yes     Renie Ora 09/06/2021, 8:21 AM

## 2021-09-06 NOTE — Progress Notes (Signed)
Patient ID: [name], male   DOB: 1946-06-06, 75 y.o.   MRN: [id number]     Progress Note from the Palliative Medicine Team at [facility]   Patient Name: Alan [name]        Date: 09/06/2021 DOB: 1946-07-14  Age: 74 y.o. MRN#: [id number] Attending Physician: Coralie Keens Primary Care Physician: Barbie Banner, MD Admit Date: 09/22/21   Medical records reviewed   75 y.o. male  admitted on 22-Sep-2021 with medical history significant of HTN, PTSD, schizophrenia, prior stroke, patient lives alone and is under the supervision of his nephew who visits him almost daily, according to the nephew he has been gradually getting weak and losing weight over the last several months, day of  the admission he found the patient on the floor, this is happened a few times prior to admission recently, he was brought to the hospital where in the ER work-up showed hypotension, dehydration and shortness of breath along with severe cachexia, L-spine fractures failure to thrive and he was admitted for further care   Family made decision to forego life prolonging measures and focus on comfort and dignity allowing for a natural death   This NP visited patient at the bedside as a follow up for palliative medicine needs and emotional support.  Patient remains lethargic, taking only sips of liquids, remains mostly positioned in a fetal position.   Spoke to nephew/Alan [name] by phone, family is comfortable with decision for comfort.  Plan of Care: -DNR/DNI -No artificial feeding or hydration now or in the future, diet as tolerated(patient tolerating only sips of liquids) -No further diagnostics or life prolonging measures-focus of care is comfort and dignity allowing for natural death -Symptom management specific to pain and dyspnea      -scheduled Roxanol for underlying chronic pain -Family is hopeful for residential hospice for end-of-life care, family is discussing if [facility]  [place] would be best        - Prognosis is likely less than two weeks   Education offered on the natural trajectory and expectations at EOL    Questions and concerns addressed   Discussed with Dr Ella Jubilee bedside RN/Jordan  Total time spent on the unit was 35 minutes  Greater than 50% of the time was spent in counseling and coordination of care  Lorinda Creed NP  Palliative Medicine Team Team Phone # [phone] Pager [phone]

## 2021-09-06 NOTE — Social Work (Signed)
CSW received consult for residential hospice- CSW called patient's nephew and brother- lvm for brother/family to call CSW back.  Antony Blackbird, MSW, LCSW Clinical Social Worker

## 2021-09-06 NOTE — Social Work (Signed)
CSW spoke with patient's nephew, Fredrik Cove- CSW discussed and offered Hospice Choice- Fredrik Cove decided on [facility] - CSW contacted Hospice  spoke with Glennie [place] - she advised she will give referral to Cassandra(intake) tomorrow morning.   CSW will continue to follow and assist with discharge planning.  Antony Blackbird, MSW, LCSW Clinical Social Worker

## 2021-09-06 NOTE — Progress Notes (Signed)
Speech Language Pathology Treatment: Dysphagia  Patient Details Name: Alan [name] MRN: [id number] DOB: 11/16/1945 Today's Date: 09/06/2021 Time: 1023-1044 SLP Time Calculation (min) (ACUTE ONLY): 21 min  Assessment / Plan / Recommendation Clinical Impression  Pt was seen for therapy targeting swallowing goals and family education. SLP called nephew to explain results of MBS and compensatory strategies secondary to concerns for esophageal dysphagia (sitting upright, small sips/bites, liquids more comfortable than solids for pt). Nephew discussed pt history of esophageal swallowing including dilation in the range of 5-10 years ago (??) Pt reports preferring liquids over solids as they are more comfortable. SLP collaborated with pt to order full liquid lunch and dinner meal trays, however recommend continue with Dys3/thin liquid diet to provide pt with greater variety of mealtime options. Pt requires no further ST follow up in current venue of care and SLP will d/c.   HPI HPI: 75 y.o. male presented to ED with generalized weakness after being found down at his home on 09/13/2021. The pt lives alone and nephew checks daily. CT showed multiple subacute lumbar compression fractures, neurosurgery recommends conservative management. PMH significant of HTN, PTSD, schizophrenia, prior stroke.      SLP Plan  All goals met      Recommendations for follow up therapy are one component of a multi-disciplinary discharge planning process, led by the attending physician.  Recommendations may be updated based on patient status, additional functional criteria and insurance authorization.    Recommendations  Diet recommendations: Dysphagia 3 (mechanical soft);Thin liquid Liquids provided via: Cup;Straw Medication Administration: Crushed with puree Supervision: Staff to assist with self feeding;Full supervision/cueing for compensatory strategies Compensations: Minimize environmental distractions;Small  sips/bites;Slow rate;Clear throat intermittently Postural Changes and/or Swallow Maneuvers: Seated upright 90 degrees;Upright 30-60 min after meal                Oral Care Recommendations: Oral care BID Follow Up Recommendations: No SLP follow up Assistance recommended at discharge: Frequent or constant Supervision/Assistance SLP Visit Diagnosis: Dysphagia, oral phase (R13.11);Other (comment) (suspect primary esophageal) Plan: All goals met       GO               , SLP-Student      09/06/2021, 11:10 AM 

## 2021-09-06 NOTE — Progress Notes (Signed)
Patient is more awake this morning. Patient drinking liquids but unable to consume meals at this time.

## 2021-09-06 NOTE — TOC CAGE-AID Note (Signed)
Transition of Care [facility]) - CAGE-AID Screening   Patient Details  Name: Alan [name] MRN: [id number] Date of Birth: Mar 13, 1946  [facility] CM/SW Contact:    Alan [name], LCSWA Phone Number: 09/06/2021, 11:29 AM   Clinical Narrative: Pt is unable to participate in Cage Aid.   [name], MSW, LCSW-A Pronouns:  She/Her/Hers [facility] of [facility] Clinical Social Worker Direct Number:  [phone] [email or web address]  CAGE-AID Screening: Substance Abuse Screening unable to be completed due to: : Patient unable to participate             Substance Abuse Education Offered: No

## 2021-09-07 DIAGNOSIS — I10 Essential (primary) hypertension: Secondary | ICD-10-CM | POA: Diagnosis not present

## 2021-09-07 DIAGNOSIS — R0902 Hypoxemia: Secondary | ICD-10-CM | POA: Diagnosis not present

## 2021-09-07 DIAGNOSIS — K5900 Constipation, unspecified: Secondary | ICD-10-CM | POA: Diagnosis not present

## 2021-09-07 DIAGNOSIS — S32000A Wedge compression fracture of unspecified lumbar vertebra, initial encounter for closed fracture: Secondary | ICD-10-CM | POA: Diagnosis not present

## 2021-09-07 LAB — CULTURE, BLOOD (ROUTINE X 2)
Culture: NO GROWTH
Culture: NO GROWTH
Special Requests: ADEQUATE
Special Requests: ADEQUATE

## 2021-09-07 MED ORDER — MORPHINE SULFATE 10 MG/5ML PO SOLN
5.0000 mg | Freq: Four times a day (QID) | ORAL | Status: DC
  Administered 2021-09-07 – 2021-09-10 (×8): 5 mg via ORAL
  Filled 2021-09-07 (×8): qty 4

## 2021-09-07 MED ORDER — MORPHINE SULFATE 10 MG/5ML PO SOLN: 5.0000 mg | ORAL | Status: DC | PRN

## 2021-09-07 NOTE — Care Management Important Message (Signed)
Important Message  Patient Details  Name: Alan [name] MRN: [id number] Date of Birth: Feb 26, 1946   Medicare Important Message Given:  Yes     Renie Ora 09/07/2021, 11:05 AM

## 2021-09-07 NOTE — Social Work (Signed)
CW informed by [facility]- no bed available today. They will contact weekend CSW Misty Stanley- if bed come available over the weekend.   TOC will continue to follow and assist with discharge planning.  Antony Blackbird, MSW, LCSW Clinical Social Worker

## 2021-09-07 NOTE — Plan of Care (Signed)
  Problem: Elimination: Goal: Will not experience complications related to urinary retention Outcome: Progressing   Problem: Pain Managment: Goal: General experience of comfort will improve Outcome: Progressing   Problem: Safety: Goal: Ability to remain free from injury will improve Outcome: Progressing   

## 2021-09-07 NOTE — Progress Notes (Signed)
PROGRESS NOTE    Alan [name]  [id number] DOB: 05-04-1946 DOA: 08/27/2021 PCP: Christain Sacramento, MD    Brief Narrative:  Alan [name] was admitted to the hospital with the working diagnosis of severe calorie protein malnutrition with cachexia and multiple falls, acute T9 and L2 fractures.  Now pending transfer to residential hospice.    75 year old male past medical history for hypertension, schizophrenia, history of CVA and PTSD who presented with generalized weakness and multiple falls.  Patient was found by his caregiver on the floor on 2 consecutive days, EMS was called and patient was brought to the hospital.  On his initial physical examination blood pressure 114/51, heart rate 63, respirate 24, oxygen saturation 90%, he was cachectic, ill looking appearing, he had decreased breath sounds at bases bilaterally, heart S1-S2, present, rhythmic, positive pressure ulcer left posterior hip.  Left lower extremity weakness and numbness.  Decreased strength 4/5 compared to the right.   Sodium 135, potassium 3.3, chloride 94, bicarb 32, glucose 119, BUN 24, creatinine 0.58, magnesium 1.7, CK1 190, white cell count 12.7, hemoglobin 9.5, hematocrit 34.2, platelets 192.  Lactic acid 1.1. SARS COVID-19 negative.   Urinalysis specific gravity 1.015, negative nitrates, negative leukocytes. Alcohol level less than 10.   Head/neck CT with left facet of C4-C5 erosive changes.  No acute intracranial changes.  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   Chest radiograph with bilateral atelectasis.   CT chest was negative for pulm embolism.  Moderate left and small right pleural effusion.  Associated bilateral lobe atelectasis.  Compression fracture T7,- T9.   EKG 67 bpm, normal axis, normal intervals, sinus rhythm, no significant ST segment or T wave changes.   Patient was found to have severe malnutrition, multiple spine compression fractures and failure to  thrive. Goals of care were addressed, now directed towards comfort care.   Sick euthyroid syndrome  Currently patient has been transitioned to comfort care, pending transfer to residential hospice.    Assessment & Plan:   Principal Problem:   Compression fracture of lumbar vertebra, closed, initial encounter ([place]) Active Problems:   Generalized weakness   HTN (hypertension)   Severe protein-calorie malnutrition (HCC)   Schizophrenia (HCC)   Pressure injury of skin   Hypoxia   Pleural effusion   Constipation     Severe protein calorie malnutrition with cachexia and failure to thrive, multiple falls, T9 and L2 compression fractures.  Left hip stage 2 pressure ulcer present on admission  Patient with very poor prognosis, poor oral intake and rapid decline in his health. Now has been transitioned to comfort care. Life expectancy is less than 2 weeks, will call his family for further advance directives.    Soft diet as tolerated, continue with thiamine and multivitamins.  Pending transfer to residential hospice.     2. Acute hypoxemic respiratory failure, due to bilateral pleural effusions. Not candidate for thoracentesis.  No apparent dyspnea, continue with supplemental 02 as needed for symptom control.    3. Metabolic encephalopathy acute in the setting of dementia, left hip unable to stage pressure skin injury present on admission  Continue to be disorientated and confused but not agitated. Patient is on an air mattress, continue local skin care.    4. Schizophrenia.  Continue with risperidone    5. HTN  On amlodipine for blood pressure control.   6. Hypokalemia  No further blood work.     Status is: Inpatient  Remains inpatient appropriate  because: pending transfer to residential hospice.    DVT prophylaxis: Na   Code Status:    DNR   Family Communication:   No family at the bedside      Nutrition Status: Nutrition Problem: Severe Malnutrition Etiology:  chronic illness (vascular dementia, esophageal stricture, stroke, schizophrenia) Signs/Symptoms: severe muscle depletion, severe fat depletion Interventions: Ensure Enlive (each supplement provides 350kcal and 20 grams of protein), Prostat, MVI, Hormel Shake     Skin Documentation: Pressure Injury 05/06/20 Sacrum Mid Stage 2 -  Partial thickness loss of dermis presenting as a shallow open injury with a red, pink wound bed without slough. Partial loss of dermis,shallow red wound bed with scant serous drainage. (Active)  05/06/20 1600  Location: Sacrum  Location Orientation: Mid  Staging: Stage 2 -  Partial thickness loss of dermis presenting as a shallow open injury with a red, pink wound bed without slough.  Wound Description (Comments): Partial loss of dermis,shallow red wound bed with scant serous drainage.  Present on Admission: Yes     Pressure Injury 09/02/21 Hip Left Unstageable - Full thickness tissue loss in which the base of the injury is covered by slough (yellow, tan, gray, green or brown) and/or eschar (tan, brown or black) in the wound bed. 2x4 cm with black area at the center (Active)  09/02/21 1229  Location: Hip  Location Orientation: Left  Staging: Unstageable - Full thickness tissue loss in which the base of the injury is covered by slough (yellow, tan, gray, green or brown) and/or eschar (tan, brown or black) in the wound bed.  Wound Description (Comments): 2x4 cm with black area at the center of the pressure wound  Present on Admission: Yes      Subjective: Patient with no apparent pain or dyspnea, no nausea or vomiting.   Objective: Vitals:   09/05/21 2007 09/06/21 0739 09/06/21 1925 09/07/21 0758  BP: (!) 130/96 106/64 127/86 (!) 115/59  Pulse: (!) 55 (!) 59 68 99  Resp: 17 19 18 19   Temp: 97.6 F (36.4 C) (!) 97.5 F (36.4 C) (!) 97.5 F (36.4 C) 97.6 F (36.4 C)  TempSrc: Oral Oral Oral Oral  SpO2: 97% 94% (!) 88% (!) 88%  Weight:      Height:        No intake or output data in the 24 hours ending 09/07/21 0924 Filed Weights   09/02/21 0348 09/02/21 1038 09/03/21 0602  Weight: 39.8 kg 39.6 kg 41.3 kg    Examination:   General: Not in pain or dyspnea .  Neurology: Awake and alert, non focal  E ENT: no pallor, no icterus, oral mucosa moist Cardiovascular: No JVD. S1-S2 present, rhythmic, no gallops, rubs, or murmurs. No lower extremity edema. Pulmonary: positive breath sounds bilaterally,  Gastrointestinal. Abdomen soft and non tender Skin. No rashes Musculoskeletal: no joint deformities     Data Reviewed: I have personally reviewed following labs and imaging studies  CBC: Recent Labs  Lab 09/13/2021 1839 09/08/2021 1845 09/02/21 0456 09/03/21 0129 09/04/21 0131  WBC 12.7*  --  9.2 9.2 10.4  NEUTROABS 11.2*  --   --  7.8* 8.5*  HGB 11.5* 11.2* 11.0* 11.5* 11.6*  HCT 34.2* 33.0* 32.9* 34.2* 33.5*  MCV 90.7  --  91.9 91.4 90.8  PLT 192  --  167 174 [id number]   Basic Metabolic Panel: Recent Labs  Lab 09/06/2021 1845 08/27/2021 2023 09/02/21 0456 09/03/21 0129 09/04/21 0131  NA 135 135 133* 134* 138  K  3.5 3.3* 3.1* 3.4* 3.4*  CL 93* 94* 94* 97* 98  CO2  --  32 32 31 29  GLUCOSE 114* 119* 98 112* 88  BUN 34* 24* 18 21 21   CREATININE 0.50* 0.58* 0.42* 0.47* 0.44*  CALCIUM  --  8.5* 7.6* 8.0* 8.1*  MG  --  1.7  --  1.9 1.8   GFR: Estimated Creatinine Clearance: 45.2 mL/min (A) (by C-G formula based on SCr of 0.44 mg/dL (L)). Liver Function Tests: Recent Labs  Lab 09/30/21 2023 09/02/21 0456 09/03/21 0129 09/04/21 0131  AST 29 27 24 25   ALT 21 18 19 20   ALKPHOS 145* 131* 122 117  BILITOT 0.6 0.6 0.4 0.6  PROT 5.4* 5.0* 5.0* 5.0*  ALBUMIN 2.7* 2.4* 2.4* 2.4*   No results for input(s): LIPASE, AMYLASE in the last 168 hours. No results for input(s): AMMONIA in the last 168 hours. Coagulation Profile: Recent Labs  Lab 09/30/21 1839 2021-09-30 2023  INR 1.1 1.1   Cardiac Enzymes: Recent Labs  Lab  09/30/2021 2023  CKTOTAL 190   BNP (last 3 results) No results for input(s): PROBNP in the last 8760 hours. HbA1C: No results for input(s): HGBA1C in the last 72 hours. CBG: Recent Labs  Lab 09/03/21 1205 09/03/21 1629 09/03/21 2151  GLUCAP 157* 164* 97   Lipid Profile: No results for input(s): CHOL, HDL, LDLCALC, TRIG, CHOLHDL, LDLDIRECT in the last 72 hours. Thyroid Function Tests: No results for input(s): TSH, T4TOTAL, FREET4, T3FREE, THYROIDAB in the last 72 hours. Anemia Panel: No results for input(s): VITAMINB12, FOLATE, FERRITIN, TIBC, IRON, RETICCTPCT in the last 72 hours.    Radiology Studies: I have reviewed all of the imaging during this hospital visit personally     Scheduled Meds:  collagenase   Topical Daily   docusate sodium  200 mg Oral BID   milk and molasses  1 enema Rectal Once   morphine CONCENTRATE  5 mg Oral Q6H   multivitamins with iron  1 tablet Oral Daily   risperiDONE  1 mg Oral Daily   thiamine  100 mg Oral Daily   Continuous Infusions:   LOS: 5 days        Alan [name] 09/05/21, MD

## 2021-09-08 DIAGNOSIS — G9341 Metabolic encephalopathy: Secondary | ICD-10-CM | POA: Diagnosis not present

## 2021-09-08 DIAGNOSIS — K5900 Constipation, unspecified: Secondary | ICD-10-CM | POA: Diagnosis not present

## 2021-09-08 DIAGNOSIS — I10 Essential (primary) hypertension: Secondary | ICD-10-CM | POA: Diagnosis not present

## 2021-09-08 DIAGNOSIS — S32000A Wedge compression fracture of unspecified lumbar vertebra, initial encounter for closed fracture: Secondary | ICD-10-CM | POA: Diagnosis not present

## 2021-09-08 NOTE — Social Work (Signed)
CSW spoke with [facility] and they confirmed that they had the referral however they did not have a bed today.CSW stated she would follow up again tomorrow.

## 2021-09-08 NOTE — Progress Notes (Signed)
PROGRESS NOTE    Eliodoro Dahms  [id number] DOB: [birth year] DOA: 09/06/2021 PCP: Christain Sacramento, MD    Brief Narrative:  Mr. Denault was admitted to the hospital with the working diagnosis of severe calorie protein malnutrition with cachexia and multiple falls, acute T9 and L2 fractures.  Now pending transfer to residential hospice.    75 year old male past medical history for hypertension, schizophrenia, history of CVA and PTSD who presented with generalized weakness and multiple falls.  Patient was found by his caregiver on the floor on 2 consecutive days, EMS was called and patient was brought to the hospital.  On his initial physical examination blood pressure 114/51, heart rate 63, respirate 24, oxygen saturation 90%, he was cachectic, ill looking appearing, he had decreased breath sounds at bases bilaterally, heart S1-S2, present, rhythmic, positive pressure ulcer left posterior hip.  Left lower extremity weakness and numbness.  Decreased strength 4/5 compared to the right.   Sodium 135, potassium 3.3, chloride 94, bicarb 32, glucose 119, BUN 24, creatinine 0.58, magnesium 1.7, CK1 190, white cell count 12.7, hemoglobin 9.5, hematocrit 34.2, platelets 192.  Lactic acid 1.1. SARS COVID-19 negative.   Urinalysis specific gravity 1.015, negative nitrates, negative leukocytes. Alcohol level less than 10.   Head/neck CT with left facet of C4-C5 erosive changes.  No acute intracranial changes.  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   Chest radiograph with bilateral atelectasis.   CT chest was negative for pulm embolism.  Moderate left and small right pleural effusion.  Associated bilateral lobe atelectasis.  Compression fracture T7,- T9.   EKG 67 bpm, normal axis, normal intervals, sinus rhythm, no significant ST segment or T wave changes.   Patient was found to have severe malnutrition, multiple spine compression fractures and failure to  thrive. Goals of care were addressed, now directed towards comfort care.   Sick euthyroid syndrome   Currently patient has been transitioned to comfort care, pending transfer to residential hospice.      Assessment & Plan:   Principal Problem:   Compression fracture of lumbar vertebra, closed, initial encounter ([place]) Active Problems:   Generalized weakness   HTN (hypertension)   Severe protein-calorie malnutrition (HCC)   Schizophrenia (HCC)   Pressure injury of skin   Hypoxia   Pleural effusion   Constipation     Severe protein calorie malnutrition with cachexia and failure to thrive, multiple falls, T9 and L2 compression fractures.  Left hip stage 2 pressure ulcer present on admission  Patient with very poor prognosis, poor oral intake and rapid decline in his health. Now has been transitioned to comfort care. Life expectancy is less than 2 weeks, will call his family for further advance directives.   Continue with thiamine and multivitamins as tolerated  Pending transfer to residential hospice.     2. Acute hypoxemic respiratory failure, due to bilateral pleural effusions. Not candidate for thoracentesis.  No apparent dyspnea, continue with supplemental 02 as needed for symptom control.    3. Metabolic encephalopathy acute in the setting of dementia, left hip unable to stage pressure skin injury present on admission  Patient very weak and deconditioned, poor oral intake, rapid decline in his health, pending transfer to residential hospice.    4. Schizophrenia.  On risperidone as tolerated   5. HTN  Blood pressure 108/67, holding on antihypertensive medications    6. Hypokalemia  No further blood work.     Status is: Inpatient  Remains inpatient  appropriate because: pending transfer to residential hospice    Nutrition Status: Nutrition Problem: Severe Malnutrition Etiology: chronic illness (vascular dementia, esophageal stricture, stroke,  schizophrenia) Signs/Symptoms: severe muscle depletion, severe fat depletion Interventions: Ensure Enlive (each supplement provides 350kcal and 20 grams of protein), Prostat, MVI, Hormel Shake     Skin Documentation: Pressure Injury 05/06/20 Sacrum Mid Stage 2 -  Partial thickness loss of dermis presenting as a shallow open injury with a red, pink wound bed without slough. Partial loss of dermis,shallow red wound bed with scant serous drainage. (Active)  05/06/20 1600  Location: Sacrum  Location Orientation: Mid  Staging: Stage 2 -  Partial thickness loss of dermis presenting as a shallow open injury with a red, pink wound bed without slough.  Wound Description (Comments): Partial loss of dermis,shallow red wound bed with scant serous drainage.  Present on Admission: Yes     Pressure Injury 09/02/21 Hip Left Unstageable - Full thickness tissue loss in which the base of the injury is covered by slough (yellow, tan, gray, green or brown) and/or eschar (tan, brown or black) in the wound bed. 2x4 cm with black area at the center (Active)  09/02/21 1229  Location: Hip  Location Orientation: Left  Staging: Unstageable - Full thickness tissue loss in which the base of the injury is covered by slough (yellow, tan, gray, green or brown) and/or eschar (tan, brown or black) in the wound bed.  Wound Description (Comments): 2x4 cm with black area at the center of the pressure wound  Present on Admission: Yes    Subjective: Patient is very weak and deconditioned,   Objective: Vitals:   09/06/21 1925 09/07/21 0758 09/08/21 0425 09/08/21 0916  BP: 127/86 (!) 115/59 124/63 108/67  Pulse: 68 99 99 92  Resp: 18 19 17 18   Temp: (!) 97.5 F (36.4 C) 97.6 F (36.4 C) (!) 97.5 F (36.4 C) (!) 97.4 F (36.3 C)  TempSrc: Oral Oral Axillary Oral  SpO2: (!) 88% (!) 88%  95%  Weight:      Height:        Intake/Output Summary (Last 24 hours) at 09/08/2021 1215 Last data filed at 09/07/2021  1816 Gross per 24 hour  Intake 60 ml  Output --  Net 60 ml   Filed Weights   09/02/21 0348 09/02/21 1038 09/03/21 0602  Weight: 39.8 kg 39.6 kg 41.3 kg    Examination:   General: deconditioned and ill looking appearing  Neurology: Awake and alert, positive confusion but not agitation  E ENT: no pallor, no icterus, oral mucosa moist Cardiovascular: No JVD. S1-S2 present, rhythmic, no edema Pulmonary: positive breath sounds bilaterally,  Gastrointestinal. Abdomen soft and non tender Skin. No rashes Musculoskeletal: no joint deformities     Data Reviewed: I have personally reviewed following labs and imaging studies  CBC: Recent Labs  Lab 2021-09-09 1839 09-Sep-2021 1845 09/02/21 0456 09/03/21 0129 09/04/21 0131  WBC 12.7*  --  9.2 9.2 10.4  NEUTROABS 11.2*  --   --  7.8* 8.5*  HGB 11.5* 11.2* 11.0* 11.5* 11.6*  HCT 34.2* 33.0* 32.9* 34.2* 33.5*  MCV 90.7  --  91.9 91.4 90.8  PLT 192  --  167 174 154   Basic Metabolic Panel: Recent Labs  Lab 09-09-21 1845 Sep 09, 2021 2023 09/02/21 0456 09/03/21 0129 09/04/21 0131  NA 135 135 133* 134* 138  K 3.5 3.3* 3.1* 3.4* 3.4*  CL 93* 94* 94* 97* 98  CO2  --  32 32 31  29  GLUCOSE 114* 119* 98 112* 88  BUN 34* 24* 18 21 21   CREATININE 0.50* 0.58* 0.42* 0.47* 0.44*  CALCIUM  --  8.5* 7.6* 8.0* 8.1*  MG  --  1.7  --  1.9 1.8   GFR: Estimated Creatinine Clearance: 45.2 mL/min (A) (by C-G formula based on SCr of 0.44 mg/dL (L)). Liver Function Tests: Recent Labs  Lab 08/25/2021 2023 09/02/21 0456 09/03/21 0129 09/04/21 0131  AST 29 27 24 25   ALT 21 18 19 20   ALKPHOS 145* 131* 122 117  BILITOT 0.6 0.6 0.4 0.6  PROT 5.4* 5.0* 5.0* 5.0*  ALBUMIN 2.7* 2.4* 2.4* 2.4*   No results for input(s): LIPASE, AMYLASE in the last 168 hours. No results for input(s): AMMONIA in the last 168 hours. Coagulation Profile: Recent Labs  Lab 09/14/2021 1839 09/18/2021 2023  INR 1.1 1.1   Cardiac Enzymes: Recent Labs  Lab 09/18/2021 2023   CKTOTAL 190   BNP (last 3 results) No results for input(s): PROBNP in the last 8760 hours. HbA1C: No results for input(s): HGBA1C in the last 72 hours. CBG: Recent Labs  Lab 09/03/21 1205 09/03/21 1629 09/03/21 2151  GLUCAP 157* 164* 97   Lipid Profile: No results for input(s): CHOL, HDL, LDLCALC, TRIG, CHOLHDL, LDLDIRECT in the last 72 hours. Thyroid Function Tests: No results for input(s): TSH, T4TOTAL, FREET4, T3FREE, THYROIDAB in the last 72 hours. Anemia Panel: No results for input(s): VITAMINB12, FOLATE, FERRITIN, TIBC, IRON, RETICCTPCT in the last 72 hours.    Radiology Studies: I have reviewed all of the imaging during this hospital visit personally     Scheduled Meds:  collagenase   Topical Daily   docusate sodium  200 mg Oral BID   milk and molasses  1 enema Rectal Once   morphine  5 mg Oral Q6H   multivitamins with iron  1 tablet Oral Daily   risperiDONE  1 mg Oral Daily   thiamine  100 mg Oral Daily   Continuous Infusions:   LOS: 6 days        Shellby Schlink Gerome Apley, MD

## 2021-09-09 DIAGNOSIS — S32000A Wedge compression fracture of unspecified lumbar vertebra, initial encounter for closed fracture: Secondary | ICD-10-CM | POA: Diagnosis not present

## 2021-09-09 DIAGNOSIS — K5900 Constipation, unspecified: Secondary | ICD-10-CM | POA: Diagnosis not present

## 2021-09-09 DIAGNOSIS — R0902 Hypoxemia: Secondary | ICD-10-CM | POA: Diagnosis not present

## 2021-09-09 DIAGNOSIS — J90 Pleural effusion, not elsewhere classified: Secondary | ICD-10-CM | POA: Diagnosis not present

## 2021-09-09 MED ORDER — DOCUSATE SODIUM 50 MG/5ML PO LIQD
100.0000 mg | Freq: Every day | ORAL | Status: DC | PRN
  Administered 2021-09-09: 100 mg via ORAL
  Filled 2021-09-09: qty 10

## 2021-09-09 NOTE — TOC Progression Note (Signed)
Transition of Care [facility]) - Progression Note    Patient Details  Name: Alan [name] MRN: [id number] Date of Birth: April 10, 1946  Transition of Care [facility] CM/SW Contact  Patrice [place], [place] Phone Number: [phone] 09/09/2021, 12:13 PM  Clinical Narrative:     CSW spoke with [facility] and they have not beds today. They will follow up tomorrow.  TOC team will continue to assist with discharge planning needs.         Expected Discharge Plan and Services                                                 Social Determinants of Health (SDOH) Interventions    Readmission Risk Interventions No flowsheet data found.

## 2021-09-09 NOTE — Progress Notes (Signed)
PROGRESS NOTE    Alan [name]  VZD:[id number] DOB: 1946-09-16 DOA: Sep 17, 2021 PCP: Barbie Banner, MD    Brief Narrative:  Alan [name] was admitted to the hospital with the working diagnosis of severe calorie protein malnutrition with cachexia and multiple falls, acute T9 and L2 fractures.  Now pending transfer to residential hospice.    75 year old male past medical history for hypertension, schizophrenia, history of CVA and PTSD who presented with generalized weakness and multiple falls.  Patient was found by his caregiver on the floor on 2 consecutive days, EMS was called and patient was brought to the hospital.  On his initial physical examination blood pressure 114/51, heart rate 63, respirate 24, oxygen saturation 90%, he was cachectic, ill looking appearing, he had decreased breath sounds at bases bilaterally, heart S1-S2, present, rhythmic, positive pressure ulcer left posterior hip.  Left lower extremity weakness and numbness.  Decreased strength 4/5 compared to the right.   Sodium 135, potassium 3.3, chloride 94, bicarb 32, glucose 119, BUN 24, creatinine 0.58, magnesium 1.7, CK1 190, white cell count 12.7, hemoglobin 9.5, hematocrit 34.2, platelets 192.  Lactic acid 1.1. SARS COVID-19 negative.   Urinalysis specific gravity 1.015, negative nitrates, negative leukocytes. Alcohol level less than 10.   Head/neck CT with left facet of C4-C5 erosive changes.  No acute intracranial changes.  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   Chest radiograph with bilateral atelectasis.   CT chest was negative for pulm embolism.  Moderate left and small right pleural effusion.  Associated bilateral lobe atelectasis.  Compression fracture T7,- T9.   EKG 67 bpm, normal axis, normal intervals, sinus rhythm, no significant ST segment or T wave changes.   Patient was found to have severe malnutrition, multiple spine compression fractures and failure to  thrive. Goals of care were addressed, now directed towards comfort care.   Sick euthyroid syndrome   Currently patient has been transitioned to comfort care, pending transfer to residential hospice.    Assessment & Plan:   Principal Problem:   Compression fracture of lumbar vertebra, closed, initial encounter (HCC) Active Problems:   Generalized weakness   HTN (hypertension)   Severe protein-calorie malnutrition (HCC)   Schizophrenia (HCC)   Pressure injury of skin   Hypoxia   Pleural effusion   Constipation     Severe protein calorie malnutrition with cachexia and failure to thrive, multiple falls, T9 and L2 compression fractures.  Left hip stage 2 pressure ulcer present on admission  Patient with very poor prognosis, poor oral intake and rapid decline in his health. Now has been transitioned to comfort care. Life expectancy is less than 2 weeks, will call his family for further advance directives.    On thiamine and multivitamins as tolerated  Pending transfer to residential hospice.     2. Acute hypoxemic respiratory failure, due to bilateral pleural effusions. Not candidate for thoracentesis.  No apparent dyspnea, continue with supplemental 02 as needed for symptom control.    3. Metabolic encephalopathy acute in the setting of dementia, left hip unable to stage pressure skin injury present on admission  His mentation has been stable, continue to be very weak and deconditioned. Pending to be transferred to hospice.    4. Schizophrenia.  Continue with risperidone as tolerated   5. HTN  Off antihypertensive medications    6. Hypokalemia  No further blood work.  Patient on comfort measures.   Status is: Inpatient  Remains inpatient appropriate because:  pending transfer to residential hospice.   DVT prophylaxis: Na   Code Status:    DNR   Family Communication:   No family at the bedside      Nutrition Status: Nutrition Problem: Severe Malnutrition Etiology:  chronic illness (vascular dementia, esophageal stricture, stroke, schizophrenia) Signs/Symptoms: severe muscle depletion, severe fat depletion Interventions: Ensure Enlive (each supplement provides 350kcal and 20 grams of protein), Prostat, MVI, Hormel Shake     Skin Documentation: Pressure Injury 05/06/20 Sacrum Mid Stage 2 -  Partial thickness loss of dermis presenting as a shallow open injury with a red, pink wound bed without slough. Partial loss of dermis,shallow red wound bed with scant serous drainage. (Active)  05/06/20 1600  Location: Sacrum  Location Orientation: Mid  Staging: Stage 2 -  Partial thickness loss of dermis presenting as a shallow open injury with a red, pink wound bed without slough.  Wound Description (Comments): Partial loss of dermis,shallow red wound bed with scant serous drainage.  Present on Admission: Yes     Pressure Injury 09/02/21 Hip Left Unstageable - Full thickness tissue loss in which the base of the injury is covered by slough (yellow, tan, gray, green or brown) and/or eschar (tan, brown or black) in the wound bed. 2x4 cm with black area at the center (Active)  09/02/21 1229  Location: Hip  Location Orientation: Left  Staging: Unstageable - Full thickness tissue loss in which the base of the injury is covered by slough (yellow, tan, gray, green or brown) and/or eschar (tan, brown or black) in the wound bed.  Wound Description (Comments): 2x4 cm with black area at the center of the pressure wound  Present on Admission: Yes     Consultants:  Pulmonary  Palliative Care       Subjective: Patient with no nausea or vomiting, tolerating po, very poor oral intake,   Objective: Vitals:   09/08/21 0425 09/08/21 0916 09/08/21 2035 09/09/21 0813  BP: 124/63 108/67 138/70 126/70  Pulse: 99 92 90 85  Resp: 17 18 17 17   Temp: (!) 97.5 F (36.4 C) (!) 97.4 F (36.3 C) 97.6 F (36.4 C) 97.9 F (36.6 C)  TempSrc: Axillary Oral Oral Oral  SpO2:  95%  92% 95%  Weight:      Height:        Intake/Output Summary (Last 24 hours) at 09/09/2021 1029 Last data filed at 09/08/2021 1600 Gross per 24 hour  Intake 100 ml  Output --  Net 100 ml   Filed Weights   09/02/21 0348 09/02/21 1038 09/03/21 0602  Weight: 39.8 kg 39.6 kg 41.3 kg    Examination:   General: deconditioned and ill looking appearing  Neurology: Awake and alert, non focal  E ENT: no pallor, no icterus, oral mucosa moist Cardiovascular: No JVD. S1-S2 present, rhythmic,  No lower extremity edema. Pulmonary: positive breath sounds bilaterally, Gastrointestinal. Abdomen soft and non tender Skin. No rashes Musculoskeletal: no joint deformities     Data Reviewed: I have personally reviewed following labs and imaging studies  CBC: Recent Labs  Lab 09/03/21 0129 09/04/21 0131  WBC 9.2 10.4  NEUTROABS 7.8* 8.5*  HGB 11.5* 11.6*  HCT 34.2* 33.5*  MCV 91.4 90.8  PLT 174 [id number]   Basic Metabolic Panel: Recent Labs  Lab 09/03/21 0129 09/04/21 0131  NA 134* 138  K 3.4* 3.4*  CL 97* 98  CO2 31 29  GLUCOSE 112* 88  BUN 21 21  CREATININE 0.47* 0.44*  CALCIUM 8.0*  8.1*  MG 1.9 1.8   GFR: Estimated Creatinine Clearance: 45.2 mL/min (A) (by C-G formula based on SCr of 0.44 mg/dL (L)). Liver Function Tests: Recent Labs  Lab 09/03/21 0129 09/04/21 0131  AST 24 25  ALT 19 20  ALKPHOS 122 117  BILITOT 0.4 0.6  PROT 5.0* 5.0*  ALBUMIN 2.4* 2.4*   No results for input(s): LIPASE, AMYLASE in the last 168 hours. No results for input(s): AMMONIA in the last 168 hours. Coagulation Profile: No results for input(s): INR, PROTIME in the last 168 hours. Cardiac Enzymes: No results for input(s): CKTOTAL, CKMB, CKMBINDEX, TROPONINI in the last 168 hours. BNP (last 3 results) No results for input(s): PROBNP in the last 8760 hours. HbA1C: No results for input(s): HGBA1C in the last 72 hours. CBG: Recent Labs  Lab 09/03/21 1205 09/03/21 1629 09/03/21 2151   GLUCAP 157* 164* 97   Lipid Profile: No results for input(s): CHOL, HDL, LDLCALC, TRIG, CHOLHDL, LDLDIRECT in the last 72 hours. Thyroid Function Tests: No results for input(s): TSH, T4TOTAL, FREET4, T3FREE, THYROIDAB in the last 72 hours. Anemia Panel: No results for input(s): VITAMINB12, FOLATE, FERRITIN, TIBC, IRON, RETICCTPCT in the last 72 hours.    Radiology Studies: I have reviewed all of the imaging during this hospital visit personally     Scheduled Meds:  collagenase   Topical Daily   milk and molasses  1 enema Rectal Once   morphine  5 mg Oral Q6H   multivitamins with iron  1 tablet Oral Daily   risperiDONE  1 mg Oral Daily   thiamine  100 mg Oral Daily   Continuous Infusions:   LOS: 7 days        Sadia Belfiore Gerome Apley, MD

## 2021-09-10 DIAGNOSIS — I10 Essential (primary) hypertension: Secondary | ICD-10-CM | POA: Diagnosis not present

## 2021-09-10 DIAGNOSIS — Z515 Encounter for palliative care: Secondary | ICD-10-CM | POA: Diagnosis not present

## 2021-09-10 DIAGNOSIS — R0600 Dyspnea, unspecified: Secondary | ICD-10-CM | POA: Diagnosis not present

## 2021-09-10 DIAGNOSIS — S32000A Wedge compression fracture of unspecified lumbar vertebra, initial encounter for closed fracture: Secondary | ICD-10-CM | POA: Diagnosis not present

## 2021-09-10 DIAGNOSIS — R451 Restlessness and agitation: Secondary | ICD-10-CM

## 2021-09-10 DIAGNOSIS — R531 Weakness: Secondary | ICD-10-CM | POA: Diagnosis not present

## 2021-09-10 DIAGNOSIS — K5900 Constipation, unspecified: Secondary | ICD-10-CM | POA: Diagnosis not present

## 2021-09-10 MED ORDER — LORAZEPAM 2 MG/ML IJ SOLN
1.0000 mg | Freq: Four times a day (QID) | INTRAMUSCULAR | Status: DC
  Administered 2021-09-10: 1 mg via INTRAVENOUS
  Filled 2021-09-10: qty 1

## 2021-09-10 MED ORDER — MORPHINE SULFATE (PF) 2 MG/ML IV SOLN
1.0000 mg | INTRAVENOUS | Status: DC | PRN
  Administered 2021-09-10: 1 mg via INTRAVENOUS
  Filled 2021-09-10: qty 1

## 2021-09-10 MED ORDER — MORPHINE SULFATE 10 MG/5ML PO SOLN
5.0000 mg | ORAL | Status: DC
  Administered 2021-09-10: 5 mg via ORAL
  Filled 2021-09-10: qty 4

## 2021-09-10 MED ORDER — LORAZEPAM 1 MG PO TABS
1.0000 mg | ORAL_TABLET | Freq: Three times a day (TID) | ORAL | Status: DC
  Administered 2021-09-10: 1 mg via ORAL
  Filled 2021-09-10: qty 1

## 2021-09-10 NOTE — Progress Notes (Signed)
PROGRESS NOTE    Alan [name]  [id number] DOB: 1946-06-23 DOA: 09/19/2021 PCP: Christain Sacramento, MD    Brief Narrative:  Alan [name] was admitted to the hospital with the working diagnosis of severe calorie protein malnutrition with cachexia and multiple falls, acute T9 and L2 fractures.  Now pending transfer to residential hospice.    75 year old male past medical history for hypertension, schizophrenia, history of CVA and PTSD who presented with generalized weakness and multiple falls.  Patient was found by his caregiver on the floor on 2 consecutive days, EMS was called and patient was brought to the hospital.  On his initial physical examination blood pressure 114/51, heart rate 63, respirate 24, oxygen saturation 90%, he was cachectic, ill looking appearing, he had decreased breath sounds at bases bilaterally, heart S1-S2, present, rhythmic, positive pressure ulcer left posterior hip.  Left lower extremity weakness and numbness.  Decreased strength 4/5 compared to the right.   Sodium 135, potassium 3.3, chloride 94, bicarb 32, glucose 119, BUN 24, creatinine 0.58, magnesium 1.7, CK1 190, white cell count 12.7, hemoglobin 9.5, hematocrit 34.2, platelets 192.  Lactic acid 1.1. SARS COVID-19 negative.   Urinalysis specific gravity 1.015, negative nitrates, negative leukocytes. Alcohol level less than 10.   Head/neck CT with left facet of C4-C5 erosive changes.  No acute intracranial changes.  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   Chest radiograph with bilateral atelectasis.   CT chest was negative for pulm embolism.  Moderate left and small right pleural effusion.  Associated bilateral lobe atelectasis.  Compression fracture T7,- T9.   EKG 67 bpm, normal axis, normal intervals, sinus rhythm, no significant ST segment or T wave changes.   Patient was found to have severe malnutrition, multiple spine compression fractures and failure to  thrive. Goals of care were addressed, now directed towards comfort care.   Sick euthyroid syndrome   Currently patient has been transitioned to comfort care, pending transfer to residential hospice.      Assessment & Plan:   Principal Problem:   Compression fracture of lumbar vertebra, closed, initial encounter ([place]) Active Problems:   Generalized weakness   HTN (hypertension)   Severe protein-calorie malnutrition (HCC)   Schizophrenia (HCC)   Pressure injury of skin   Hypoxia   Pleural effusion   Constipation     Severe protein calorie malnutrition with cachexia and failure to thrive, multiple falls, T9 and L2 compression fractures.  Left hip stage 2 pressure ulcer present on admission  Patient with very poor prognosis, poor oral intake and rapid decline in his health. Now has been transitioned to comfort care. Life expectancy is less than 2 weeks, will call his family for further advance directives.    Pending transfer to residential hospice.    2. Acute hypoxemic respiratory failure, due to bilateral pleural effusions. Not candidate for thoracentesis.  No apparent dyspnea, continue with supplemental 02 as needed for symptom control.    3. Metabolic encephalopathy acute in the setting of dementia, left hip unable to stage pressure skin injury present on admission  Pending transfer to residential hospice. Patient has been very somnolent and hyporeactive. Poor oral intake and rapid decline.     4. Schizophrenia.  Discontinue psychotropic medications    5. HTN  Off antihypertensive medications    6. Hypokalemia  No further blood work.  Patient on comfort measures  Status is: Inpatient  Remains inpatient appropriate because: pending transfer to residential hospice.  DVT prophylaxis: Na   Code Status:    DNR   Family Communication:   No family at the bedside      Nutrition Status: Nutrition Problem: Severe Malnutrition Etiology: chronic illness (vascular  dementia, esophageal stricture, stroke, schizophrenia) Signs/Symptoms: severe muscle depletion, severe fat depletion Interventions: Ensure Enlive (each supplement provides 350kcal and 20 grams of protein), Prostat, MVI, Hormel Shake     Skin Documentation: Pressure Injury 05/06/20 Sacrum Mid Stage 2 -  Partial thickness loss of dermis presenting as a shallow open injury with a red, pink wound bed without slough. Partial loss of dermis,shallow red wound bed with scant serous drainage. (Active)  05/06/20 1600  Location: Sacrum  Location Orientation: Mid  Staging: Stage 2 -  Partial thickness loss of dermis presenting as a shallow open injury with a red, pink wound bed without slough.  Wound Description (Comments): Partial loss of dermis,shallow red wound bed with scant serous drainage.  Present on Admission: Yes     Pressure Injury 09/02/21 Hip Left Unstageable - Full thickness tissue loss in which the base of the injury is covered by slough (yellow, tan, gray, green or brown) and/or eschar (tan, brown or black) in the wound bed. 2x4 cm with black area at the center (Active)  09/02/21 1229  Location: Hip  Location Orientation: Left  Staging: Unstageable - Full thickness tissue loss in which the base of the injury is covered by slough (yellow, tan, gray, green or brown) and/or eschar (tan, brown or black) in the wound bed.  Wound Description (Comments): 2x4 cm with black area at the center of the pressure wound  Present on Admission: Yes     Consultants:  Palliative care   Subjective: Patient has been very somnolent, poor oral intake and poorly interactive.   Objective: Vitals:   09/08/21 0916 09/08/21 2035 09/09/21 0813 09/09/21 2025  BP: 108/67 138/70 126/70 (!) 107/50  Pulse: 92 90 85 61  Resp: 18 17 17    Temp: (!) 97.4 F (36.3 C) 97.6 F (36.4 C) 97.9 F (36.6 C) (!) 97 F (36.1 C)  TempSrc: Oral Oral Oral Axillary  SpO2: 95% 92% 95% (!) 88%  Weight:      Height:         Intake/Output Summary (Last 24 hours) at 09/10/2021 1055 Last data filed at 09/10/2021 0930 Gross per 24 hour  Intake 120 ml  Output --  Net 120 ml   Filed Weights   09/02/21 0348 09/02/21 1038 09/03/21 0602  Weight: 39.8 kg 39.6 kg 41.3 kg    Examination:   General: deconditioned and ill looking appearing Neurology: somnolent and hyporeactive   E ENT: no pallor, no icterus, oral mucosa moist Cardiovascular: No JVD. S1-S2 present, rhythmic, no gallops, rubs, or murmurs. No lower extremity edema. Pulmonary: positive breath sounds bilaterally, poor inspiratory effort Gastrointestinal. Abdomen soft and non tender Skin. No rashes Musculoskeletal: no joint deformities     Data Reviewed: I have personally reviewed following labs and imaging studies  CBC: Recent Labs  Lab 09/04/21 0131  WBC 10.4  NEUTROABS 8.5*  HGB 11.6*  HCT 33.5*  MCV 90.8  PLT 154   Basic Metabolic Panel: Recent Labs  Lab 09/04/21 0131  NA 138  K 3.4*  CL 98  CO2 29  GLUCOSE 88  BUN 21  CREATININE 0.44*  CALCIUM 8.1*  MG 1.8   GFR: Estimated Creatinine Clearance: 45.2 mL/min (A) (by C-G formula based on SCr of 0.44 mg/dL (L)). Liver Function  Tests: Recent Labs  Lab 09/04/21 0131  AST 25  ALT 20  ALKPHOS 117  BILITOT 0.6  PROT 5.0*  ALBUMIN 2.4*   No results for input(s): LIPASE, AMYLASE in the last 168 hours. No results for input(s): AMMONIA in the last 168 hours. Coagulation Profile: No results for input(s): INR, PROTIME in the last 168 hours. Cardiac Enzymes: No results for input(s): CKTOTAL, CKMB, CKMBINDEX, TROPONINI in the last 168 hours. BNP (last 3 results) No results for input(s): PROBNP in the last 8760 hours. HbA1C: No results for input(s): HGBA1C in the last 72 hours. CBG: Recent Labs  Lab 09/03/21 1205 09/03/21 1629 09/03/21 2151  GLUCAP 157* 164* 97   Lipid Profile: No results for input(s): CHOL, HDL, LDLCALC, TRIG, CHOLHDL, LDLDIRECT in the last 72  hours. Thyroid Function Tests: No results for input(s): TSH, T4TOTAL, FREET4, T3FREE, THYROIDAB in the last 72 hours. Anemia Panel: No results for input(s): VITAMINB12, FOLATE, FERRITIN, TIBC, IRON, RETICCTPCT in the last 72 hours.    Radiology Studies: I have reviewed all of the imaging during this hospital visit personally     Scheduled Meds:  collagenase   Topical Daily   LORazepam  1 mg Oral Q8H   milk and molasses  1 enema Rectal Once   morphine  5 mg Oral Q4H   Continuous Infusions:   LOS: 8 days        Alan [name] Gerome Apley, MD

## 2021-09-10 NOTE — Progress Notes (Signed)
Patient ID: Alan [name], male   DOB: November 06, 1945, 75 y.o.   MRN: [id number]     Progress Note from the Palliative Medicine Team at [facility]   Patient Name: Alan [name]        Date: 09/10/2021 DOB: January 17, 1946  Age: 75 y.o. MRN#: [id number] Attending Physician: Coralie Keens Primary Care Physician: Barbie Banner, MD Admit Date: 08/24/2021   Medical records reviewed   75 y.o. male  admitted on 08/30/2021 with medical history significant of HTN, PTSD, schizophrenia, prior stroke, patient lives alone and is under the supervision of his nephew who visits him almost daily, according to the nephew he has been gradually getting weak and losing weight over the last several months, day of  the admission he found the patient on the floor, this is happened a few times prior to admission recently, he was brought to the hospital where in the ER work-up showed hypotension, dehydration and shortness of breath along with severe cachexia, L-spine fractures failure to thrive and he was admitted for further care   Family made decision to forego life prolonging measures and focus on comfort and dignity allowing for a natural death   This NP visited patient at the bedside as a follow up for palliative medicine needs and emotional support.  Patient more lethargic,  taking only sips of liquids, continues to decline.  Today is he appears agitated and generally uncomfortable.     Plan of Care: -DNR/DNI -No artificial feeding or hydration now or in the future, diet as tolerated(patient tolerating only sips of liquids) -No further diagnostics or life prolonging measures-focus of care is comfort and dignity allowing for natural death -Symptom management specific to pain and dyspnea      -scheduled Roxanol every 4 for underlying chronic pain      -Roxanol every 4 prn  for pain/dyspnea      -Ativan 1 mg scheduled every 6 hours  -Patient  await residential hospice bed for end-of-life  care,      -Prognosis is days    Questions and concerns addressed   Discussed with Dr Ella Jubilee bedside RN/[place]  Total time spent on the unit was 25 minutes  Greater than 50% of the time was spent in counseling and coordination of care  Lorinda Creed NP  Palliative Medicine Team Team Phone # [phone] Pager [phone]

## 2021-09-10 NOTE — Progress Notes (Signed)
Nutrition Brief Note  Chart reviewed. Patient has transitioned to comfort care.  Pending transfer to residential hospice. No further nutrition interventions planned at this time.  Please re-consult as needed.   Gabriel Rainwater, RD, LDN, CNSC Please refer to [facility] for contact information.

## 2021-09-20 NOTE — Death Summary Note (Signed)
Death Summary  Alan [name] [id number] DOB: [birth year] DOA: 09/02/21  PCP: Christain Sacramento, MD  Admit date: 2021/09/02 Date of Death: 09-12-21 Time of Death: 12:04 am Notification: Christain Sacramento, MD notified of death of September 12, 2021   History of present illness:  Alan [name] is a 75 y.o. male with a history of hypertension, schizophrenia, history of CVA and PTSD Alan [name] presented with complaint of presented with generalized weakness and multiple falls Alan [name] did not improve after supportive medical therapy.   Mr. Alan [name] was admitted to the hospital with the working diagnosis of severe calorie protein malnutrition with cachexia and multiple falls, acute T9 and L2 fractures.    Patient was found by his caregiver on the floor on 2 consecutive days, EMS was called and patient was brought to the hospital.  On his initial physical examination blood pressure 114/51, heart rate 63, respiratory rate 24, oxygen saturation 90%, he was cachectic, ill looking appearing, he had decreased breath sounds at bases bilaterally, heart S1-S2, present, rhythmic, positive pressure ulcer left posterior hip.  Left lower extremity weakness and numbness.  Decreased strength 4/5 compared to the right.   Sodium 135, potassium 3.3, chloride 94, bicarb 32, glucose 119, BUN 24, creatinine 0.58, magnesium 1.7, CK1 190, white cell count 12.7, hemoglobin 9.5, hematocrit 34.2, platelets 192.  Lactic acid 1.1. SARS COVID-19 negative.   Urinalysis specific gravity 1.015, negative nitrates, negative leukocytes. Alcohol level less than 10.   Head/neck CT with left facet of C4-C5 erosive changes.  No acute intracranial changes.  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   Chest radiograph with bilateral atelectasis.   CT chest was negative for pulm embolism.  Moderate left and small right pleural effusion.  Associated bilateral lobe  atelectasis.  Compression fracture T7,- T9.   EKG 67 bpm, normal axis, normal intervals, sinus rhythm, no significant ST segment or T wave changes.   Patient was found to have severe malnutrition, multiple spine compression fractures and failure to thrive. Goals of care were addressed, and directed towards comfort care.   Sick euthyroid syndrome   Patient was placed on comfort measures and transition of care was consulted for residential hospice.    Final Diagnoses:  Severe protein calorie malnutrition with cachexia and failure to thrive, multiple falls, T9 and L2 compression fractures.  Left hip stage 2 pressure ulcer present on admission  2. Acute hypoxemic respiratory failure, due to bilateral pleural effusions.  3. Metabolic encephalopathy acute in the setting of dementia, left hip unable to stage pressure skin injury present on admission  4. Schizophrenia.  5. HTN  6. Hypokalemia    The results of significant diagnostics from this hospitalization (including imaging, microbiology, ancillary and laboratory) are listed below for reference.    Significant Diagnostic Studies: CT ABDOMEN PELVIS WO CONTRAST  Result Date: 09/02/2021 CLINICAL DATA:  Abdominal trauma. EXAM: CT ABDOMEN AND PELVIS WITHOUT CONTRAST TECHNIQUE: Multidetector CT imaging of the abdomen and pelvis was performed following the standard protocol without IV contrast. COMPARISON:  09/02/2021 FINDINGS: Lower chest: The heart is enlarged and multi-vessel coronary artery calcifications are noted. There is a moderate left pleural effusion and a small right pleural effusion with patchy atelectasis or infiltrate at the lung bases. Hepatobiliary: A few scattered hypodensities are present in the liver, most likely representing cysts or hemangioma is. No biliary ductal dilatation. The gallbladder is not visualized on exam. Pancreas: Within normal limits on noncontrast exam. Spleen:  Normal in size without focal abnormality.  Adrenals/Urinary Tract: There is thickening of the right adrenal gland without evidence of discrete nodule. The left adrenal gland is not well seen. Excreted contrast is noted in the kidneys bilaterally. There are bilateral renal cysts, the largest on the left measuring 10.9 cm. No hydronephrosis. Contrast is present in the urinary bladder and multiple bladder diverticula are noted. Stomach/Bowel: The stomach is nondistended. No bowel obstruction, free air or pneumatosis. A large amount of stool is present in the colon and rectum. The rectum is markedly distended measuring up to 10.2 cm in diameter. The appendix is not visualized on exam due to paucity of intra-abdominal fat. There is limited evaluation for bowel wall thickening. Vascular/Lymphatic: There is heavy atherosclerotic calcification of the aorta without evidence of aneurysm. No abdominal or pelvic lymphadenopathy. Reproductive: The prostate gland is normal in size and contains coarse calcifications. Other: No free fluid. Musculoskeletal: There is diffusely decreased mineralization of the bones. Compression deformities are present at T9 slightly retropulsed element resulting in no significant spinal canal stenosis. There is an acute compression fracture in the superior endplate of L1 with loss of vertebral body height of approximately 75% in slightly retropulsed element with mild spinal canal stenosis. There is an acute comminuted compression fracture at L2 with a retropulsed element and loss of vertebral body height of approximately 70% centrally, resulting in mild spinal canal stenosis. There is a compression fracture in the superior endplate at L4 with no significant retropulsed element. IMPRESSION: 1. Acute comminuted compression fracture at L2 with loss of vertebral body height of 70% centrally. There is a retropulsed element resulting in mild spinal canal stenosis. 2. Compression fracture in the superior endplate at L1 which appears acute with loss  of vertebral body height centrally of approximately 75%. There is a slight retropulsed element resulting in mild spinal canal stenosis. 3. Compression deformities at T9 and L4, indeterminate in age. 4. Large amount of stool in the colon and rectum. The rectum is markedly distended suggesting possibility of fecal impaction. 5. Small right pleural effusion and moderate left pleural effusion with atelectasis or infiltrate at the lung bases. 6. Bilateral renal cysts. 7. Multiple scattered bladder diverticula. Electronically Signed   By: Thornell Sartorius M.D.   On: 09/02/2021 03:38   DG Chest 1 View  Result Date: 09/18/2021 CLINICAL DATA:  Fall, pain. EXAM: CHEST  1 VIEW COMPARISON:  Chest x-ray 05/06/2020. FINDINGS: There are atherosclerotic calcifications throughout the aorta. Cardiac silhouette is within normal limits. There are bands of atelectasis in the right lung base and left mid lung. There are patchy opacities in the left lung base with small left pleural effusion. There is no pneumothorax. No acute fractures are seen. IMPRESSION: 1. Small left pleural effusion with left basilar atelectasis/airspace disease. Electronically Signed   By: Darliss Cheney M.D.   On: 09/19/2021 20:00   DG Pelvis 1-2 Views  Result Date: 09/18/2021 CLINICAL DATA:  Fall EXAM: PELVIS - 1-2 VIEW COMPARISON:  Radiograph dated December 23rd 2011 FINDINGS: No definite displaced fracture. Evaluation is somewhat limited due to patient rotation. Limited evaluation of the bilateral iliac bones and sacrum due to a large amount of overlying bowel gas. Vascular calcifications. IMPRESSION: No definite displaced fracture. Exam is limited by patient rotation, osteopenia and large amount of of gas overlying the bilateral iliac bones and sacrum. Electronically Signed   By: Allegra Lai M.D.   On: 08/21/2021 20:05   CT HEAD WO CONTRAST  Result Date: 09/14/2021  CLINICAL DATA:  Head trauma, minor (Age >= 65y); Neck trauma (Age >= 65y) EXAM:  CT HEAD WITHOUT CONTRAST CT CERVICAL SPINE WITHOUT CONTRAST TECHNIQUE: Multidetector CT imaging of the head and cervical spine was performed following the standard protocol without intravenous contrast. Multiplanar CT image reconstructions of the cervical spine were also generated. COMPARISON:  May 06, 2020 FINDINGS: CT HEAD FINDINGS Brain: No evidence of acute infarction, hemorrhage, hydrocephalus, extra-axial collection or mass lesion/mass effect. Global parenchymal volume loss, advanced for age. Encephalomalacia in the RIGHT ACA distribution. Encephalomalacia of the LEFT parietal lobe. Periventricular white matter hypodensities consistent with sequela of chronic microvascular ischemic disease. Vascular: Vascular calcifications. Skull: No acute fracture. Sinuses/Orbits: Revisualization of a soft tissue mass of the RIGHT mastoid with extension into the middle ear with progressive erosive changes of the ossicles. RIGHT mastoid effusion, similar in comparison to prior. Other: LEFT ear cerumen. CT CERVICAL SPINE FINDINGS Alignment: Normal. Skull base and vertebrae: There is new irregular erosive change along the superior margin of the LEFT facet of C5 with the loose sclerotic osseous fragment noted in the facet space (series 9, image 32). Mild erosive irregularity of the inferior aspect of the LEFT facet of C4 (series 8, image 44). Soft tissues and spinal canal: No prevertebral fluid or swelling. No visible canal hematoma. Disc levels:  Mild uncovertebral hypertrophy at C3-4. Upper chest: Paraseptal and centrilobular emphysema. Unchanged subcentimeter RIGHT thyroid nodule for which no specific follow-up is recommended. Other: Severe atherosclerotic calcifications of the carotid arteries. IMPRESSION: 1. New widening of the LEFT facet of C4-5 with apparent erosive changes along the facet. Differential considerations include osteomyelitis, neoplasm, inflammatory process or sequela of trauma. Recommend further dedicated  evaluation with contrasted enhanced MRI cervical spine. 2.  No acute intracranial abnormality. 3. 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 There has been progressive extension of the soft tissue mass to the ossicles. This could be further assessed with dedicated brain MRI. Electronically Signed   By: Valentino Saxon M.D.   On: 08/27/2021 19:34   CT Angio Chest PE W and/or Wo Contrast  Result Date: 09/02/2021 CLINICAL DATA:  Hypoxia EXAM: CT ANGIOGRAPHY CHEST WITH CONTRAST TECHNIQUE: Multidetector CT imaging of the chest was performed using the standard protocol during bolus administration of intravenous contrast. Multiplanar CT image reconstructions and MIPs were obtained to evaluate the vascular anatomy. CONTRAST:  74mL OMNIPAQUE IOHEXOL 350 MG/ML SOLN COMPARISON:  Chest radiograph dated 08/27/2021 FINDINGS: Cardiovascular: Satisfactory opacification the bilateral pulmonary arteries to the segmental level. No evidence of pulmonary embolism. Although not tailored for evaluation of the thoracic aorta, there is no evidence thoracic aortic aneurysm or dissection. Atherosclerotic calcifications of the aortic arch. The heart is normal in size.  No pericardial effusion. Three vessel coronary atherosclerosis. Mediastinum/Nodes: No suspicious mediastinal lymphadenopathy. Visualized thyroid is notable for an 11 mm right thyroid nodule (series 5/image 27). Not clinically significant; no follow-up imaging recommended (ref: J Am Coll Radiol. 2015 Feb;12(2): 143-50). Lungs/Pleura: No suspicious pulmonary nodules. Moderate left and small right pleural effusions. No frank interstitial edema. Associated bilateral lower lobe atelectasis. No focal consolidation. No pneumothorax. Upper Abdomen: Visualized upper abdomen is notable for bilateral renal cysts and vascular calcifications. Musculoskeletal: Mild superior endplate compression fracture deformity at T7. Moderate  compression fracture deformity at T9 with minimal retropulsion. These are both age indeterminate. Review of the MIP images confirms the above findings. IMPRESSION: No evidence of pulmonary embolism. Moderate left and small right pleural effusions. Associated  bilateral lower lobe atelectasis. No frank interstitial edema. Mild superior endplate compression fracture deformity at T7. Moderate compression fracture deformity at T9 with mild retropulsion. These are both age indeterminate. Aortic Atherosclerosis (ICD10-I70.0). Electronically Signed   By: Julian Hy M.D.   On: 09/02/2021 01:49   CT CERVICAL SPINE WO CONTRAST  Result Date: 09/02/2021 CLINICAL DATA:  Head trauma, minor (Age >= 65y); Neck trauma (Age >= 65y) EXAM: CT HEAD WITHOUT CONTRAST CT CERVICAL SPINE WITHOUT CONTRAST TECHNIQUE: Multidetector CT imaging of the head and cervical spine was performed following the standard protocol without intravenous contrast. Multiplanar CT image reconstructions of the cervical spine were also generated. COMPARISON:  May 06, 2020 FINDINGS: CT HEAD FINDINGS Brain: No evidence of acute infarction, hemorrhage, hydrocephalus, extra-axial collection or mass lesion/mass effect. Global parenchymal volume loss, advanced for age. Encephalomalacia in the RIGHT ACA distribution. Encephalomalacia of the LEFT parietal lobe. Periventricular white matter hypodensities consistent with sequela of chronic microvascular ischemic disease. Vascular: Vascular calcifications. Skull: No acute fracture. Sinuses/Orbits: Revisualization of a soft tissue mass of the RIGHT mastoid with extension into the middle ear with progressive erosive changes of the ossicles. RIGHT mastoid effusion, similar in comparison to prior. Other: LEFT ear cerumen. CT CERVICAL SPINE FINDINGS Alignment: Normal. Skull base and vertebrae: There is new irregular erosive change along the superior margin of the LEFT facet of C5 with the loose sclerotic osseous  fragment noted in the facet space (series 9, image 32). Mild erosive irregularity of the inferior aspect of the LEFT facet of C4 (series 8, image 44). Soft tissues and spinal canal: No prevertebral fluid or swelling. No visible canal hematoma. Disc levels:  Mild uncovertebral hypertrophy at C3-4. Upper chest: Paraseptal and centrilobular emphysema. Unchanged subcentimeter RIGHT thyroid nodule for which no specific follow-up is recommended. Other: Severe atherosclerotic calcifications of the carotid arteries. IMPRESSION: 1. New widening of the LEFT facet of C4-5 with apparent erosive changes along the facet. Differential considerations include osteomyelitis, neoplasm, inflammatory process or sequela of trauma. Recommend further dedicated evaluation with contrasted enhanced MRI cervical spine. 2.  No acute intracranial abnormality. 3. 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 There has been progressive extension of the soft tissue mass to the ossicles. This could be further assessed with dedicated brain MRI. Electronically Signed   By: Valentino Saxon M.D.   On: 09/10/2021 19:34   MR BRAIN WO CONTRAST  Result Date: 09/02/2021 CLINICAL DATA:  Initial evaluation for neuro deficit, stroke suspected./ EXAM: MRI HEAD WITHOUT CONTRAST TECHNIQUE: Multiplanar, multiecho pulse sequences of the brain and surrounding structures were obtained without intravenous contrast. COMPARISON:  Prior CT from 08/31/2021. FINDINGS: Brain: Diffuse prominence of the CSF containing spaces compatible with generalized cerebral atrophy, fairly advanced in nature. Scattered patchy T2/FLAIR hyperintensity involving the periventricular deep white matter both cerebral hemispheres most consistent with chronic small vessel ischemic disease, moderate in nature. Few scattered remote lacunar infarcts present about the hemispheric cerebral white matter. Scattered areas of encephalomalacia and  gliosis involving the parasagittal right frontal and parietal lobes, consistent with prior ischemic infarct. Additional remote infarct noted at the left parietal lobe. Few areas of associated chronic hemosiderin staining noted about these areas of ischemia. Associated wallerian degeneration with atrophy noted at the right cerebral peduncle/midbrain. No abnormal foci of restricted diffusion to suggest acute or subacute ischemia. Gray-white matter differentiation otherwise maintained. No evidence for acute intracranial hemorrhage. No mass lesion, midline shift or mass effect. Diffuse ventricular prominence related  to global parenchymal volume loss of hydrocephalus. No extra-axial fluid collection. Pituitary gland suprasellar region within normal limits. Midline structures intact. Vascular: Abnormal flow voids within both internal carotid arteries to the termini, consistent with previously identified occlusion. Major intracranial vascular flow voids are otherwise maintained. Skull and upper cervical spine: Craniocervical junction within normal limits. Bone marrow signal intensity normal. No scalp soft tissue abnormality. Sinuses/Orbits: Globes and orbital soft tissues within normal limits. Paranasal sinuses are largely clear. 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 Associated chronic appearing right mastoid effusion. Other: None. IMPRESSION: 1. No acute intracranial infarct or other abnormality. 2. 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 3. 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 4. Advanced cerebral atrophy with moderate chronic microvascular ischemic disease. 5. 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 Electronically Signed   By: Jeannine Boga M.D.   On: 09/02/2021 03:16   MR CERVICAL SPINE WO CONTRAST  Result Date: 09/02/2021 CLINICAL DATA:  Initial evaluation for chronic neck pain, erosive changes at left C4-5 facet. EXAM: MRI CERVICAL SPINE WITHOUT CONTRAST TECHNIQUE: Multiplanar, multisequence MR imaging of the cervical spine was performed. No intravenous contrast was administered. COMPARISON:  Prior CT from 09/07/2021. FINDINGS: Alignment: Examination moderately to severely degraded by motion artifact. Exaggeration of the normal cervical lordosis. 2 mm retrolisthesis of C3 on C4, with trace anterolisthesis of C4 on C5. Findings presumably chronic and facet mediated. Vertebrae: Vertebral body height maintained without acute or chronic fracture. Bone marrow signal intensity within normal limits. No worrisome osseous lesions. Mild discogenic reactive endplate change present about the C4-5 interspace. Additionally, there is relatively mild edema about the left C4-5 facet, corresponding with abnormality on prior CT. Overall, appearance on MRI felt to be most consistent with degenerative facet arthritis. No significantly increased edema or surrounding inflammatory changes as is typically seen with septic arthritis. No other abnormal marrow edema. Cord: Signal intensity within the cervical spinal cord grossly within normal limits on this motion degraded exam. Posterior Fossa, vertebral arteries, paraspinal tissues: Craniocervical junction normal. Paraspinous and prevertebral soft tissues grossly within normal limits on this motion degraded exam. Normal flow voids seen within the vertebral arteries bilaterally. Disc levels: C2-C3: Negative interspace. Mild right-sided facet hypertrophy. No significant spinal stenosis. Foramina remain patent. C3-C4: Trace retrolisthesis with mild degenerative intervertebral disc space narrowing. Broad posterior disc protrusion  flattens and partially faces the ventral thecal sac, eccentric to the right. Superimposed facet and ligament flavum hypertrophy. Resultant moderate spinal stenosis with mild cord flattening, but no visible cord signal changes. Moderate right C4 foraminal stenosis. Left neural foramen appears grossly patent. C4-C5: Mild degenerative intervertebral disc space narrowing with diffuse disc bulge and bilateral uncovertebral spurring. Bilateral facet arthrosis with mild ligament flavum hypertrophy. Resultant moderate spinal stenosis with mild cord flattening, but no visible cord signal changes. Moderate right with mild left C5 foraminal narrowing. C5-C6: Mild degenerative intervertebral disc space narrowing with diffuse disc bulge and bilateral uncovertebral spurring. Broad posterior disc osteophyte flattens and partially faces the ventral thecal sac with resultant mild spinal stenosis. Severe right with moderate left C6 foraminal narrowing. C6-C7: Minimal annular disc bulge. Advanced left with mild right facet arthrosis. No spinal stenosis. Foramina appear grossly patent. C7-T1:  Negative interspace.  No canal or foraminal stenosis. Visualized upper thoracic spine demonstrates no significant finding. IMPRESSION: 1. Motion degraded exam. 2. Mild edema about the left C4-5 facet, corresponding with abnormality on prior CT. Overall, appearance is felt to be most consistent with degenerative facet arthritis. No significantly increased edema or surrounding inflammatory changes as is typically seen with septic arthritis. Correlation with symptomatology and laboratory values recommended. 3. Multilevel cervical spondylosis with resultant mild to moderate spinal stenosis at C3-4 through C5-6 as above. Associated moderate right C4 and C5 foraminal stenosis, with severe right and moderate left C6 foraminal narrowing. Electronically Signed   By: Jeannine Boga M.D.   On: 09/02/2021 03:28   MR THORACIC SPINE WO  CONTRAST  Result Date: 09/02/2021 CLINICAL DATA:  Compression fracture. EXAM: MRI THORACIC AND LUMBAR SPINE WITHOUT CONTRAST TECHNIQUE: Multiplanar and multiecho pulse sequences of the thoracic and lumbar spine were obtained without intravenous contrast. COMPARISON:  None. FINDINGS: MRI THORACIC SPINE FINDINGS Alignment: Exaggerated thoracic kyphosis. No traumatic malalignment. Vertebrae: Superior endplate fractures of T5 and T7 with mild height loss and no marrow edema. Compression fractures of T9 and T11 with edematous signal that is confluent at T9 and moderately extensive at T11. Mild retropulsion at both levels with mild spinal canal narrowing at T9 where the cord is deflected but not compressed. Diffuse involvement of the T9 body is notable, but no paravertebral mass or posterior element involvement to imply underlying pathologic lesion. Remote T12 compression fracture with mild superior endplate depression. Cord:  No cord edema or spinal canal hematoma. Paraspinal and other soft tissues: Layering pleural effusions, greater on the left where volume is small to moderate. Very large left renal cyst Disc levels: No significant degenerative changes or degenerative cord impingement. MRI LUMBAR SPINE FINDINGS Segmentation:  5 lumbar type vertebrae Alignment:  No traumatic malalignment Vertebrae: Compression fractures of L1 and L2 with marrow edema and fluid-filled fracture planes. Height loss at both levels is advanced. L4 compression fracture with mild depression of the superior endplate. Marrow edema at this level is mild. Retropulsion at L2 and L1 without conus compression. Conus medullaris and cauda equina: Conus extends to the L1-2 level. Conus and cauda equina appear normal. Paraspinal and other soft tissues: Very large left renal cyst. Persistent stool distended rectum. Disc levels: Disc desiccation and fissuring at L4-5 and L5-S1. IMPRESSION: MR THORACIC SPINE IMPRESSION 1. Acute/unhealed compression  fractures at T9 and T11. Retropulsion at T9 causes mild spinal stenosis and cord deviation. 2. Remote compression fractures of T5, T7, and T12. 3. Layering pleural effusions greater on the left. MR LUMBAR SPINE IMPRESSION 1. Acute/unhealed compression fractures at L1 and L2 with fluid-filled fracture clefts and advanced height loss. Mild retropulsion at both levels. 2. L4 superior endplate fracture with mild height loss, subacute in appearance. Electronically Signed   By: Jorje Guild M.D.   On: 09/02/2021 10:29   MR LUMBAR SPINE WO CONTRAST  Result Date: 09/02/2021 CLINICAL DATA:  Compression fracture. EXAM: MRI THORACIC AND LUMBAR SPINE WITHOUT CONTRAST TECHNIQUE: Multiplanar and multiecho pulse sequences of the thoracic and lumbar spine were obtained without intravenous contrast. COMPARISON:  None. FINDINGS: MRI THORACIC SPINE FINDINGS Alignment: Exaggerated thoracic kyphosis. No traumatic malalignment. Vertebrae: Superior endplate fractures of T5 and T7 with mild height loss and no marrow edema. Compression fractures of T9 and T11 with edematous signal that is confluent at T9 and moderately extensive at T11. Mild retropulsion at both levels with mild spinal canal narrowing at T9 where the cord is deflected  but not compressed. Diffuse involvement of the T9 body is notable, but no paravertebral mass or posterior element involvement to imply underlying pathologic lesion. Remote T12 compression fracture with mild superior endplate depression. Cord:  No cord edema or spinal canal hematoma. Paraspinal and other soft tissues: Layering pleural effusions, greater on the left where volume is small to moderate. Very large left renal cyst Disc levels: No significant degenerative changes or degenerative cord impingement. MRI LUMBAR SPINE FINDINGS Segmentation:  5 lumbar type vertebrae Alignment:  No traumatic malalignment Vertebrae: Compression fractures of L1 and L2 with marrow edema and fluid-filled fracture  planes. Height loss at both levels is advanced. L4 compression fracture with mild depression of the superior endplate. Marrow edema at this level is mild. Retropulsion at L2 and L1 without conus compression. Conus medullaris and cauda equina: Conus extends to the L1-2 level. Conus and cauda equina appear normal. Paraspinal and other soft tissues: Very large left renal cyst. Persistent stool distended rectum. Disc levels: Disc desiccation and fissuring at L4-5 and L5-S1. IMPRESSION: MR THORACIC SPINE IMPRESSION 1. Acute/unhealed compression fractures at T9 and T11. Retropulsion at T9 causes mild spinal stenosis and cord deviation. 2. Remote compression fractures of T5, T7, and T12. 3. Layering pleural effusions greater on the left. MR LUMBAR SPINE IMPRESSION 1. Acute/unhealed compression fractures at L1 and L2 with fluid-filled fracture clefts and advanced height loss. Mild retropulsion at both levels. 2. L4 superior endplate fracture with mild height loss, subacute in appearance. Electronically Signed   By: Jorje Guild M.D.   On: 09/02/2021 10:29   DG Chest Port 1 View  Result Date: 09/04/2021 CLINICAL DATA:  Shortness of breath. EXAM: PORTABLE CHEST 1 VIEW COMPARISON:  September 03, 2021. FINDINGS: Stable cardiomediastinal silhouette. Moderate size left pleural effusion is noted which is increased compared to prior exam. Associated left lung atelectasis is noted. Mild right pleural effusion is now noted with right basilar atelectasis. Bony thorax is unremarkable. IMPRESSION: Increased bilateral pleural effusions are noted with associated atelectasis, left greater than right. Aortic Atherosclerosis (ICD10-I70.0). Electronically Signed   By: Marijo Conception M.D.   On: 09/04/2021 08:16   DG Chest Port 1 View  Result Date: 09/03/2021 CLINICAL DATA:  Shortness of breath EXAM: PORTABLE CHEST 1 VIEW COMPARISON:  Chest radiograph 08/31/2021 FINDINGS: The cardiomediastinal silhouette is stable. There is  unchanged dense calcified atherosclerotic plaque throughout the thoracic aorta. There is a moderate size layering left pleural effusion, slightly increased in size since 09/08/2021. Adjacent airspace disease likely reflects atelectasis. The left upper lung is clear. There is a trace right pleural effusion with adjacent linear opacities likely reflecting atelectasis. There is no other focal airspace disease on the right. There is no pneumothorax. There is no acute osseous abnormality. Deformity of the left humeral head is unchanged. There is gaseous distention of the large bowel in the imaged upper abdomen, unchanged. IMPRESSION: Moderate size left pleural effusion is increased in size since 09/08/2021. Trace right pleural effusion is not significantly changed. Adjacent airspace disease in the left lung base likely reflects atelectasis, though infection cannot be excluded. Electronically Signed   By: Valetta Mole M.D.   On: 09/03/2021 13:21   DG Shoulder Left  Result Date: 08/23/2021 CLINICAL DATA:  Fall, left shoulder pain EXAM: LEFT SHOULDER - 2+ VIEW COMPARISON:  CT left shoulder dated 05/07/2020 FINDINGS: Left shoulder deformity related to prior impacted femoral neck fracture. No acute fracture is seen. Visualized soft tissues are within normal limits. Visualized left lung  is notable for a suspected small left pleural effusion. IMPRESSION: Left shoulder deformity related to prior impractical femoral neck fracture. No acute fracture is seen. Suspected small left pleural effusion. Electronically Signed   By: Julian Hy M.D.   On: 08/30/2021 20:02   DG Swallowing Func-Speech Pathology  Result Date: 09/04/2021 Table formatting from the original result was not included. Objective Swallowing Evaluation: Type of Study: MBS-Modified Barium Swallow Study  Patient Details Name: Suleiman Vacanti MRN: [id number] Date of Birth: 1946-01-23 Today's Date: 09/04/2021 Time: SLP Start Time (ACUTE ONLY): 76  -SLP Stop Time (ACUTE ONLY): 1323 SLP Time Calculation (min) (ACUTE ONLY): 15 min Past Medical History: Past Medical History: Diagnosis Date  Esophageal stricture   Hypertension   PTSD (post-traumatic stress disorder)   Stroke [facility])  Past Surgical History: Past Surgical History: Procedure Laterality Date  MELANOMA EXCISION    ORIF FEMUR FRACTURE- LISS PLATE   HPI: 75 y.o. male presented to ED with generalized weakness after being found down at his home on 08/22/2021. The pt lives alone and nephew checks daily. CT showed multiple subacute lumbar compression fractures, neurosurgery recommends conservative management. PMH significant of HTN, PTSD, schizophrenia, prior stroke.  Subjective: Pt awake and alert on non-rebreather.  Recommendations for follow up therapy are one component of a multi-disciplinary discharge planning process, led by the attending physician.  Recommendations may be updated based on patient status, additional functional criteria and insurance authorization. Assessment / Plan / Recommendation Clinical Impressions 09/04/2021 Clinical Impression Pt demonstrated mild oral and a suspected primary esophageal dysphagia. Oral phase marked by reduced posterior propulsion and delayed transit. His strength and range of motion of laryngeal structures was within functional range. Significant cervical esophageal residue noted after initial trial puree that was present for majority of study with thicker consistencies. Upon further esophageal  scan, appearance of residue in distal esophagus. Suspect altered esophageal pressures may have led to the mild laryngeal penetration present with thin  (PAS 3). No aspiration observed. Thin barium assisted in significant reduction of stasis. Prolonged mastication with cracker. Thin liquids recommended and initially Dys 2 recommended however therapist has since learned that pt is now comfort care, therefore recommend regular texture and family can order what pt can tolerate.  ST will plan to see pt once more for family education. SLP Visit Diagnosis Dysphagia, oral phase (R13.11);Other (comment) Attention and concentration deficit following -- Frontal lobe and executive function deficit following -- Impact on safety and function Mild aspiration risk;Moderate aspiration risk   Treatment Recommendations 09/04/2021 Treatment Recommendations Therapy as outlined in treatment plan below   Prognosis 09/04/2021 Prognosis for Safe Diet Advancement Guarded Barriers to Reach Goals Severity of deficits Barriers/Prognosis Comment -- Diet Recommendations 09/04/2021 SLP Diet Recommendations Regular solids;Thin liquid;Other (Comment) Liquid Administration via Cup;Straw Medication Administration Crushed with puree Compensations Minimize environmental distractions;Small sips/bites;Slow rate;Clear throat intermittently Postural Changes Seated upright at 90 degrees;Remain semi-upright after after feeds/meals (Comment)   Other Recommendations 09/04/2021 Recommended Consults -- Oral Care Recommendations Oral care BID Other Recommendations -- Follow Up Recommendations No SLP follow up Assistance recommended at discharge Frequent or constant Supervision/Assistance Functional Status Assessment -- Frequency and Duration  09/04/2021 Speech Therapy Frequency (ACUTE ONLY) min 1 x/week Treatment Duration 1 week   Oral Phase 09/04/2021 Oral Phase Impaired Oral - Pudding Teaspoon -- Oral - Pudding Cup -- Oral - Honey Teaspoon -- Oral - Honey Cup Delayed oral transit;Weak lingual manipulation Oral - Nectar Teaspoon -- Oral - Nectar Cup Delayed oral transit;Decreased bolus cohesion  Oral - Nectar Straw -- Oral - Thin Teaspoon -- Oral - Thin Cup Delayed oral transit;Decreased bolus cohesion;Reduced posterior propulsion Oral - Thin Straw Delayed oral transit;Decreased bolus cohesion Oral - Puree Reduced posterior propulsion Oral - Mech Soft -- Oral - Regular Delayed oral transit;Reduced posterior propulsion Oral -  Multi-Consistency -- Oral - Pill -- Oral Phase - Comment --  Pharyngeal Phase 09/04/2021 Pharyngeal Phase Impaired Pharyngeal- Pudding Teaspoon -- Pharyngeal -- Pharyngeal- Pudding Cup -- Pharyngeal -- Pharyngeal- Honey Teaspoon -- Pharyngeal -- Pharyngeal- Honey Cup Pharyngeal residue - valleculae Pharyngeal -- Pharyngeal- Nectar Teaspoon -- Pharyngeal -- Pharyngeal- Nectar Cup WFL Pharyngeal -- Pharyngeal- Nectar Straw -- Pharyngeal -- Pharyngeal- Thin Teaspoon -- Pharyngeal -- Pharyngeal- Thin Cup Penetration/Aspiration during swallow Pharyngeal Material enters airway, remains ABOVE vocal cords and not ejected out Pharyngeal- Thin Straw Penetration/Aspiration during swallow Pharyngeal Material enters airway, remains ABOVE vocal cords and not ejected out Pharyngeal- Puree Pharyngeal residue - valleculae Pharyngeal -- Pharyngeal- Mechanical Soft -- Pharyngeal -- Pharyngeal- Regular WFL Pharyngeal -- Pharyngeal- Multi-consistency -- Pharyngeal -- Pharyngeal- Pill -- Pharyngeal -- Pharyngeal Comment --  Cervical Esophageal Phase  09/04/2021 Cervical Esophageal Phase (No Data) Pudding Teaspoon -- Pudding Cup -- Honey Teaspoon -- Honey Cup -- Nectar Teaspoon -- Nectar Cup -- Nectar Straw -- Thin Teaspoon -- Thin Cup -- Thin Straw -- Puree -- Mechanical Soft -- Regular -- Multi-consistency -- Pill -- Cervical Esophageal Comment -- [place] Siren 09/04/2021, 3:11 PM                      Microbiology: Recent Results (from the past 240 hour(s))  Resp Panel by RT-PCR (Flu A&B, Covid) Nasopharyngeal Swab     Status: None   Collection Time: 09/02/2021  6:39 PM   Specimen: Nasopharyngeal Swab; Nasopharyngeal(NP) swabs in vial transport medium  Result Value Ref Range Status   SARS Coronavirus 2 by RT PCR NEGATIVE NEGATIVE Final    Comment: (NOTE) SARS-CoV-2 target nucleic acids are NOT DETECTED.  The SARS-CoV-2 RNA is generally detectable in upper respiratory specimens during the acute phase of infection. The  lowest concentration of SARS-CoV-2 viral copies this assay can detect is 138 copies/mL. A negative result does not preclude SARS-Cov-2 infection and should not be used as the sole basis for treatment or other patient management decisions. A negative result may occur with  improper specimen collection/handling, submission of specimen other than nasopharyngeal swab, presence of viral mutation(s) within the areas targeted by this assay, and inadequate number of viral copies(<138 copies/mL). A negative result must be combined with clinical observations, patient history, and epidemiological information. The expected result is Negative.  Fact Sheet for Patients:  EntrepreneurPulse.com.au  Fact Sheet for Healthcare Providers:  IncredibleEmployment.be  This test is no t yet approved or cleared by the [place] FDA and  has been authorized for detection and/or diagnosis of SARS-CoV-2 by FDA under an Emergency Use Authorization (EUA). This EUA will remain  in effect (meaning this test can be used) for the duration of the COVID-19 declaration under Section 564(b)(1) of the Act, 21 U.S.C.section 360bbb-3(b)(1), unless the authorization is terminated  or revoked sooner.       Influenza A by PCR NEGATIVE NEGATIVE Final   Influenza B by PCR NEGATIVE NEGATIVE Final    Comment: (NOTE) The Xpert Xpress SARS-CoV-2/FLU/RSV plus assay is intended as an aid in the diagnosis of influenza from Nasopharyngeal swab specimens and should not be used as a sole basis for treatment. Nasal  washings and aspirates are unacceptable for Xpert Xpress SARS-CoV-2/FLU/RSV testing.  Fact Sheet for Patients: EntrepreneurPulse.com.au  Fact Sheet for Healthcare Providers: IncredibleEmployment.be  This test is not yet approved or cleared by the [place] FDA and has been authorized for detection and/or diagnosis of SARS-CoV-2 by FDA under  an Emergency Use Authorization (EUA). This EUA will remain in effect (meaning this test can be used) for the duration of the COVID-19 declaration under Section 564(b)(1) of the Act, 21 U.S.C. section 360bbb-3(b)(1), unless the authorization is terminated or revoked.  Performed at [facility], [place] [address] [place], [place] [address]   Blood culture (routine x 2)     Status: None   Collection Time: 09/02/21  1:30 AM   Specimen: BLOOD  Result Value Ref Range Status   Specimen Description BLOOD LEFT ANTECUBITAL  Final   Special Requests   Final    BOTTLES DRAWN AEROBIC AND ANAEROBIC Blood Culture adequate volume   Culture   Final    NO GROWTH 5 DAYS Performed at [facility], [place] [address], [place], [place] [address]    Report Status 09/07/2021 FINAL  Final  Blood culture (routine x 2)     Status: None   Collection Time: 09/02/21  4:57 AM   Specimen: BLOOD  Result Value Ref Range Status   Specimen Description BLOOD LEFT ANTECUBITAL  Final   Special Requests   Final    BOTTLES DRAWN AEROBIC AND ANAEROBIC Blood Culture adequate volume   Culture   Final    NO GROWTH 5 DAYS Performed at [facility], [place] [address] [place], [place] [address]    Report Status 09/07/2021 FINAL  Final     Labs: Basic Metabolic Panel: No results for input(s): NA, K, CL, CO2, GLUCOSE, BUN, CREATININE, CALCIUM, MG, PHOS in the last 168 hours. Liver Function Tests: No results for input(s): AST, ALT, ALKPHOS, BILITOT, PROT, ALBUMIN in the last 168 hours. No results for input(s): LIPASE, AMYLASE in the last 168 hours. No results for input(s): AMMONIA in the last 168 hours. CBC: No results for input(s): WBC, NEUTROABS, HGB, HCT, MCV, PLT in the last 168 hours. Cardiac Enzymes: No results for input(s): CKTOTAL, CKMB, CKMBINDEX, TROPONINI in the last 168 hours. D-Dimer No results for input(s): DDIMER in the last 72 hours. BNP: Invalid input(s): POCBNP CBG: No results for  input(s): GLUCAP in the last 168 hours. Anemia work up No results for input(s): VITAMINB12, FOLATE, FERRITIN, TIBC, IRON, RETICCTPCT in the last 72 hours. Urinalysis    Component Value Date/Time   COLORURINE YELLOW 09/09/2021 1839   APPEARANCEUR CLOUDY (A) 08/31/2021 1839   LABSPEC 1.015 08/23/2021 1839   PHURINE 7.0 09/17/2021 1839   GLUCOSEU NEGATIVE 08/31/2021 1839   HGBUR NEGATIVE 09/17/2021 1839   BILIRUBINUR NEGATIVE 09/12/2021 1839   KETONESUR NEGATIVE 09/15/2021 1839   PROTEINUR NEGATIVE 08/25/2021 1839   UROBILINOGEN 0.2 02/16/2012 1232   NITRITE NEGATIVE 08/27/2021 1839   LEUKOCYTESUR NEGATIVE 09/10/2021 1839   Sepsis Labs Invalid input(s): PROCALCITONIN,  WBC,  LACTICIDVEN     SIGNED:  Tawni Millers, MD  [facility] 23-Sep-2021, 8:28 AM Pager   If 7PM-7AM, please contact night-coverage [email or web address] Password [email or web address]

## 2021-09-20 NOTE — Progress Notes (Signed)
Called to patient's room by primary RN Mendy Wax to evaluate patient. Patient unresponsive and no visible signs of respirations. Auscultated heart and lungs for heart and breath sounds and none were present. No carotid pulse felt. Patient is DNR and palliative care. Order on chart for RN to pronounce death. Time of death 37 on 10-02-2021. Patient's RN Mendy Wax verified no signs of life present.Mendy is notifying MD and patient's family. Started post-mortem checklist.

## 2021-09-20 NOTE — Progress Notes (Signed)
HOSPITAL MEDICINE OVERNIGHT EVENT NOTE    Notified by nursing that patient has unfortunately expired at 12:04 AM.  Patient was pronounced by 2 RNs.  Patient was comfort measures and unfortunately was suffering from failure to thrive with numerous complications.  Nursing has notified family.  Mortality services has also been notified.  Marinda Elk  MD [facility]

## 2021-09-20 NOTE — Final Progress Note (Signed)
Patient's nephew Alan [name] called and advised we would need to contact patient's brother Alan [name] at [phone] to advise him of patient's death as he would be the one to make funeral home arrangements. Again asked if any family was coming to hospital and he stated he did not think so. Advised him patient's body would be sent to morgue and family would need to contact patient placement with funeral home information. He gave phone number of patient's niece Alan [name] as [phone], stated she might be able to help contact patient's brother. Called number for patient's brother  and left a voice mail message.

## 2021-09-20 NOTE — Progress Notes (Signed)
While rounding on patient, noted patient without pulse, respirations or blood pressure. Patient is a DNR.

## 2021-09-20 DEATH — deceased
